# Patient Record
Sex: Female | Born: 1970
Health system: Southern US, Community
[De-identification: ages and names within clinical notes are randomized; demographics above are authoritative.]

## PROBLEM LIST (undated history)

## (undated) DIAGNOSIS — Z6741 Type O blood, Rh negative: Secondary | ICD-10-CM

## (undated) DIAGNOSIS — Z9889 Other specified postprocedural states: Secondary | ICD-10-CM

## (undated) DIAGNOSIS — C50919 Malignant neoplasm of unspecified site of unspecified female breast: Secondary | ICD-10-CM

## (undated) DIAGNOSIS — F419 Anxiety disorder, unspecified: Secondary | ICD-10-CM

## (undated) DIAGNOSIS — Z5189 Encounter for other specified aftercare: Secondary | ICD-10-CM

## (undated) DIAGNOSIS — Z9221 Personal history of antineoplastic chemotherapy: Secondary | ICD-10-CM

## (undated) DIAGNOSIS — Z923 Personal history of irradiation: Secondary | ICD-10-CM

## (undated) DIAGNOSIS — K589 Irritable bowel syndrome without diarrhea: Secondary | ICD-10-CM

## (undated) DIAGNOSIS — A6000 Herpesviral infection of urogenital system, unspecified: Secondary | ICD-10-CM

## (undated) DIAGNOSIS — D649 Anemia, unspecified: Secondary | ICD-10-CM

## (undated) DIAGNOSIS — M1990 Unspecified osteoarthritis, unspecified site: Secondary | ICD-10-CM

## (undated) DIAGNOSIS — O00109 Unspecified tubal pregnancy without intrauterine pregnancy: Secondary | ICD-10-CM

## (undated) DIAGNOSIS — T7840XA Allergy, unspecified, initial encounter: Secondary | ICD-10-CM

## (undated) DIAGNOSIS — R896 Abnormal cytological findings in specimens from other organs, systems and tissues: Secondary | ICD-10-CM

## (undated) HISTORY — DX: Personal history of irradiation: Z92.3

## (undated) HISTORY — DX: Allergy, unspecified, initial encounter: T78.40XA

## (undated) HISTORY — DX: Irritable bowel syndrome, unspecified: K58.9

## (undated) HISTORY — DX: Personal history of antineoplastic chemotherapy: Z92.21

## (undated) HISTORY — DX: Malignant neoplasm of unspecified site of unspecified female breast: C50.919

## (undated) HISTORY — DX: Herpesviral infection of urogenital system, unspecified: A60.00

## (undated) HISTORY — DX: Anxiety disorder, unspecified: F41.9

## (undated) HISTORY — DX: Anemia, unspecified: D64.9

## (undated) HISTORY — PX: EYE SURGERY: SHX253

## (undated) HISTORY — DX: Unspecified tubal pregnancy without intrauterine pregnancy: O00.109

## (undated) HISTORY — DX: Type O blood, Rh negative: Z67.41

## (undated) HISTORY — DX: Unspecified osteoarthritis, unspecified site: M19.90

## (undated) HISTORY — PX: WISDOM TOOTH EXTRACTION: SHX21

## (undated) HISTORY — DX: Encounter for other specified aftercare: Z51.89

## (undated) HISTORY — PX: LAPAROSCOPY: SHX197

## (undated) HISTORY — DX: Abnormal cytological findings in specimens from other organs, systems and tissues: R89.6

## (undated) HISTORY — PX: NASAL SEPTUM SURGERY: SHX37

## (undated) HISTORY — DX: Other specified postprocedural states: Z98.890

## (undated) HISTORY — PX: CHOLECYSTECTOMY: SHX55

---

## 1996-12-01 DIAGNOSIS — O00109 Unspecified tubal pregnancy without intrauterine pregnancy: Secondary | ICD-10-CM

## 1996-12-01 HISTORY — DX: Unspecified tubal pregnancy without intrauterine pregnancy: O00.109

## 2000-05-01 ENCOUNTER — Other Ambulatory Visit: Admission: RE | Admit: 2000-05-01 | Discharge: 2000-05-01 | Payer: Self-pay | Admitting: Obstetrics and Gynecology

## 2000-05-18 ENCOUNTER — Encounter (INDEPENDENT_AMBULATORY_CARE_PROVIDER_SITE_OTHER): Payer: Self-pay | Admitting: *Deleted

## 2000-05-18 ENCOUNTER — Encounter: Admission: RE | Admit: 2000-05-18 | Discharge: 2000-05-18 | Payer: Self-pay | Admitting: Surgery

## 2000-05-18 ENCOUNTER — Other Ambulatory Visit: Admission: RE | Admit: 2000-05-18 | Discharge: 2000-05-18 | Payer: Self-pay | Admitting: Surgery

## 2000-05-18 ENCOUNTER — Encounter: Payer: Self-pay | Admitting: Surgery

## 2000-05-19 ENCOUNTER — Encounter: Payer: Self-pay | Admitting: Surgery

## 2000-06-05 ENCOUNTER — Encounter (INDEPENDENT_AMBULATORY_CARE_PROVIDER_SITE_OTHER): Payer: Self-pay | Admitting: *Deleted

## 2000-06-05 ENCOUNTER — Ambulatory Visit (HOSPITAL_COMMUNITY): Admission: RE | Admit: 2000-06-05 | Discharge: 2000-06-06 | Payer: Self-pay | Admitting: Surgery

## 2000-06-05 ENCOUNTER — Encounter: Payer: Self-pay | Admitting: Surgery

## 2000-06-05 HISTORY — PX: BREAST LUMPECTOMY: SHX2

## 2000-06-15 ENCOUNTER — Encounter: Payer: Self-pay | Admitting: Surgery

## 2000-06-15 ENCOUNTER — Ambulatory Visit (HOSPITAL_COMMUNITY): Admission: RE | Admit: 2000-06-15 | Discharge: 2000-06-15 | Payer: Self-pay | Admitting: Surgery

## 2000-06-15 ENCOUNTER — Encounter (INDEPENDENT_AMBULATORY_CARE_PROVIDER_SITE_OTHER): Payer: Self-pay | Admitting: *Deleted

## 2000-08-02 ENCOUNTER — Emergency Department (HOSPITAL_COMMUNITY): Admission: EM | Admit: 2000-08-02 | Discharge: 2000-08-02 | Payer: Self-pay | Admitting: Emergency Medicine

## 2000-11-30 ENCOUNTER — Encounter: Admission: RE | Admit: 2000-11-30 | Discharge: 2001-02-28 | Payer: Self-pay | Admitting: Radiation Oncology

## 2001-01-20 ENCOUNTER — Ambulatory Visit (HOSPITAL_BASED_OUTPATIENT_CLINIC_OR_DEPARTMENT_OTHER): Admission: RE | Admit: 2001-01-20 | Discharge: 2001-01-20 | Payer: Self-pay | Admitting: Surgery

## 2001-05-07 ENCOUNTER — Encounter: Payer: Self-pay | Admitting: Oncology

## 2001-05-07 ENCOUNTER — Encounter: Admission: RE | Admit: 2001-05-07 | Discharge: 2001-05-07 | Payer: Self-pay | Admitting: Oncology

## 2001-05-17 ENCOUNTER — Other Ambulatory Visit: Admission: RE | Admit: 2001-05-17 | Discharge: 2001-05-17 | Payer: Self-pay | Admitting: Obstetrics and Gynecology

## 2002-01-24 ENCOUNTER — Encounter: Payer: Self-pay | Admitting: Obstetrics and Gynecology

## 2002-01-24 ENCOUNTER — Ambulatory Visit (HOSPITAL_COMMUNITY): Admission: RE | Admit: 2002-01-24 | Discharge: 2002-01-24 | Payer: Self-pay | Admitting: Obstetrics and Gynecology

## 2002-02-04 ENCOUNTER — Encounter (INDEPENDENT_AMBULATORY_CARE_PROVIDER_SITE_OTHER): Payer: Self-pay | Admitting: Specialist

## 2002-02-04 ENCOUNTER — Ambulatory Visit (HOSPITAL_COMMUNITY): Admission: RE | Admit: 2002-02-04 | Discharge: 2002-02-04 | Payer: Self-pay | Admitting: Obstetrics and Gynecology

## 2002-05-10 ENCOUNTER — Encounter: Admission: RE | Admit: 2002-05-10 | Discharge: 2002-05-10 | Payer: Self-pay | Admitting: Oncology

## 2002-05-10 ENCOUNTER — Encounter: Payer: Self-pay | Admitting: Oncology

## 2003-05-12 ENCOUNTER — Encounter: Admission: RE | Admit: 2003-05-12 | Discharge: 2003-05-12 | Payer: Self-pay | Admitting: Oncology

## 2003-05-12 ENCOUNTER — Encounter: Payer: Self-pay | Admitting: Oncology

## 2004-05-31 ENCOUNTER — Encounter: Admission: RE | Admit: 2004-05-31 | Discharge: 2004-05-31 | Payer: Self-pay | Admitting: Oncology

## 2005-01-10 ENCOUNTER — Ambulatory Visit: Payer: Self-pay | Admitting: Oncology

## 2005-07-14 ENCOUNTER — Ambulatory Visit: Payer: Self-pay | Admitting: Oncology

## 2005-07-17 ENCOUNTER — Encounter: Admission: RE | Admit: 2005-07-17 | Discharge: 2005-07-17 | Payer: Self-pay | Admitting: Oncology

## 2005-07-28 ENCOUNTER — Encounter: Admission: RE | Admit: 2005-07-28 | Discharge: 2005-07-28 | Payer: Self-pay | Admitting: Oncology

## 2006-01-22 ENCOUNTER — Ambulatory Visit: Payer: Self-pay | Admitting: Oncology

## 2006-08-07 ENCOUNTER — Encounter: Admission: RE | Admit: 2006-08-07 | Discharge: 2006-08-07 | Payer: Self-pay | Admitting: Oncology

## 2007-02-04 ENCOUNTER — Ambulatory Visit: Payer: Self-pay | Admitting: Oncology

## 2007-05-04 ENCOUNTER — Ambulatory Visit: Payer: Self-pay | Admitting: Oncology

## 2007-08-25 ENCOUNTER — Encounter: Admission: RE | Admit: 2007-08-25 | Discharge: 2007-08-25 | Payer: Self-pay | Admitting: Oncology

## 2007-10-11 ENCOUNTER — Encounter: Admission: RE | Admit: 2007-10-11 | Discharge: 2007-10-11 | Payer: Self-pay | Admitting: Oncology

## 2007-12-02 DIAGNOSIS — IMO0001 Reserved for inherently not codable concepts without codable children: Secondary | ICD-10-CM

## 2007-12-02 HISTORY — DX: Reserved for inherently not codable concepts without codable children: IMO0001

## 2008-02-08 ENCOUNTER — Ambulatory Visit: Payer: Self-pay | Admitting: Oncology

## 2008-03-29 ENCOUNTER — Ambulatory Visit: Payer: Self-pay | Admitting: Oncology

## 2008-05-23 ENCOUNTER — Ambulatory Visit: Payer: Self-pay | Admitting: Oncology

## 2008-08-25 ENCOUNTER — Encounter: Admission: RE | Admit: 2008-08-25 | Discharge: 2008-08-25 | Payer: Self-pay | Admitting: Oncology

## 2008-12-08 ENCOUNTER — Ambulatory Visit: Payer: Self-pay | Admitting: Family Medicine

## 2008-12-21 ENCOUNTER — Encounter: Payer: Self-pay | Admitting: Obstetrics and Gynecology

## 2008-12-21 ENCOUNTER — Ambulatory Visit: Payer: Self-pay | Admitting: Family Medicine

## 2008-12-21 ENCOUNTER — Ambulatory Visit: Payer: Self-pay | Admitting: Obstetrics & Gynecology

## 2008-12-21 LAB — CONVERTED CEMR LAB
Antibody Screen: NEGATIVE
Basophils Absolute: 0.1 10*3/uL (ref 0.0–0.1)
Basophils Relative: 1 % (ref 0–1)
Eosinophils Absolute: 0.1 10*3/uL (ref 0.0–0.7)
MCHC: 31 g/dL (ref 30.0–36.0)
MCV: 88.2 fL (ref 78.0–100.0)
Monocytes Absolute: 0.4 10*3/uL (ref 0.1–1.0)
Monocytes Relative: 5 % (ref 3–12)
Neutro Abs: 6.3 10*3/uL (ref 1.7–7.7)
Neutrophils Relative %: 70 % (ref 43–77)
RBC: 4.42 M/uL (ref 3.87–5.11)
RDW: 13.5 % (ref 11.5–15.5)
Rubella: 8 intl units/mL — ABNORMAL HIGH

## 2008-12-22 ENCOUNTER — Encounter: Payer: Self-pay | Admitting: Obstetrics and Gynecology

## 2008-12-29 ENCOUNTER — Ambulatory Visit: Payer: Self-pay | Admitting: Physician Assistant

## 2008-12-29 LAB — CONVERTED CEMR LAB: GC Probe Amp, Urine: NEGATIVE

## 2009-01-26 ENCOUNTER — Ambulatory Visit (HOSPITAL_COMMUNITY): Admission: RE | Admit: 2009-01-26 | Discharge: 2009-01-26 | Payer: Self-pay | Admitting: Obstetrics & Gynecology

## 2009-02-02 ENCOUNTER — Ambulatory Visit: Payer: Self-pay | Admitting: Family

## 2009-02-13 ENCOUNTER — Ambulatory Visit (HOSPITAL_COMMUNITY): Admission: RE | Admit: 2009-02-13 | Discharge: 2009-02-13 | Payer: Self-pay | Admitting: Obstetrics & Gynecology

## 2009-02-27 ENCOUNTER — Ambulatory Visit (HOSPITAL_COMMUNITY): Admission: RE | Admit: 2009-02-27 | Discharge: 2009-02-27 | Payer: Self-pay | Admitting: Obstetrics & Gynecology

## 2009-03-02 ENCOUNTER — Ambulatory Visit: Payer: Self-pay | Admitting: Oncology

## 2009-03-09 ENCOUNTER — Ambulatory Visit: Payer: Self-pay | Admitting: Family

## 2009-03-30 ENCOUNTER — Ambulatory Visit (HOSPITAL_COMMUNITY): Admission: RE | Admit: 2009-03-30 | Discharge: 2009-03-30 | Payer: Self-pay | Admitting: Obstetrics & Gynecology

## 2009-04-06 ENCOUNTER — Ambulatory Visit: Payer: Self-pay | Admitting: Obstetrics and Gynecology

## 2009-04-25 ENCOUNTER — Ambulatory Visit: Payer: Self-pay | Admitting: Obstetrics & Gynecology

## 2009-05-04 ENCOUNTER — Ambulatory Visit: Payer: Self-pay | Admitting: Family

## 2009-05-04 LAB — CONVERTED CEMR LAB
Antibody Screen: NEGATIVE
Platelets: 299 10*3/uL (ref 150–400)
RBC: 3.54 M/uL — ABNORMAL LOW (ref 3.87–5.11)
WBC: 12.9 10*3/uL — ABNORMAL HIGH (ref 4.0–10.5)

## 2009-05-08 ENCOUNTER — Ambulatory Visit: Payer: Self-pay | Admitting: Obstetrics & Gynecology

## 2009-05-11 ENCOUNTER — Ambulatory Visit (HOSPITAL_COMMUNITY): Admission: RE | Admit: 2009-05-11 | Discharge: 2009-05-11 | Payer: Self-pay | Admitting: Obstetrics & Gynecology

## 2009-05-18 ENCOUNTER — Ambulatory Visit: Payer: Self-pay | Admitting: Family

## 2009-05-23 ENCOUNTER — Ambulatory Visit: Payer: Self-pay | Admitting: Oncology

## 2009-05-29 ENCOUNTER — Encounter: Admission: RE | Admit: 2009-05-29 | Discharge: 2009-05-29 | Payer: Self-pay | Admitting: Oncology

## 2009-05-29 ENCOUNTER — Encounter (INDEPENDENT_AMBULATORY_CARE_PROVIDER_SITE_OTHER): Payer: Self-pay | Admitting: Diagnostic Radiology

## 2009-06-01 ENCOUNTER — Ambulatory Visit: Payer: Self-pay | Admitting: Obstetrics and Gynecology

## 2009-06-06 ENCOUNTER — Ambulatory Visit: Payer: Self-pay | Admitting: Genetic Counselor

## 2009-06-06 ENCOUNTER — Ambulatory Visit: Payer: Self-pay | Admitting: Obstetrics & Gynecology

## 2009-06-07 ENCOUNTER — Ambulatory Visit: Payer: Self-pay | Admitting: Oncology

## 2009-06-08 ENCOUNTER — Ambulatory Visit (HOSPITAL_COMMUNITY): Admission: RE | Admit: 2009-06-08 | Discharge: 2009-06-08 | Payer: Self-pay | Admitting: Obstetrics & Gynecology

## 2009-06-11 ENCOUNTER — Ambulatory Visit: Payer: Self-pay | Admitting: Obstetrics & Gynecology

## 2009-06-12 ENCOUNTER — Ambulatory Visit: Payer: Self-pay | Admitting: Obstetrics & Gynecology

## 2009-06-15 ENCOUNTER — Inpatient Hospital Stay (HOSPITAL_COMMUNITY): Admission: AD | Admit: 2009-06-15 | Discharge: 2009-06-15 | Payer: Self-pay | Admitting: Obstetrics & Gynecology

## 2009-06-15 ENCOUNTER — Ambulatory Visit: Payer: Self-pay | Admitting: Advanced Practice Midwife

## 2009-06-18 ENCOUNTER — Inpatient Hospital Stay (HOSPITAL_COMMUNITY): Admission: RE | Admit: 2009-06-18 | Discharge: 2009-06-19 | Payer: Self-pay | Admitting: Surgery

## 2009-06-18 ENCOUNTER — Encounter (INDEPENDENT_AMBULATORY_CARE_PROVIDER_SITE_OTHER): Payer: Self-pay | Admitting: Surgery

## 2009-06-18 HISTORY — PX: MASTECTOMY: SHX3

## 2009-06-27 ENCOUNTER — Ambulatory Visit: Payer: Self-pay | Admitting: Obstetrics & Gynecology

## 2009-06-29 ENCOUNTER — Ambulatory Visit (HOSPITAL_COMMUNITY): Admission: RE | Admit: 2009-06-29 | Discharge: 2009-06-29 | Payer: Self-pay | Admitting: Obstetrics & Gynecology

## 2009-07-04 ENCOUNTER — Ambulatory Visit: Payer: Self-pay | Admitting: Obstetrics & Gynecology

## 2009-07-05 ENCOUNTER — Encounter: Payer: Self-pay | Admitting: Obstetrics & Gynecology

## 2009-07-05 LAB — CONVERTED CEMR LAB
Chlamydia, DNA Probe: NEGATIVE
GC Probe Amp, Genital: NEGATIVE

## 2009-07-06 ENCOUNTER — Ambulatory Visit: Payer: Self-pay | Admitting: Genetic Counselor

## 2009-07-11 ENCOUNTER — Ambulatory Visit: Payer: Self-pay | Admitting: Obstetrics & Gynecology

## 2009-07-16 ENCOUNTER — Inpatient Hospital Stay (HOSPITAL_COMMUNITY): Admission: AD | Admit: 2009-07-16 | Discharge: 2009-07-20 | Payer: Self-pay | Admitting: Obstetrics & Gynecology

## 2009-07-16 ENCOUNTER — Ambulatory Visit: Payer: Self-pay | Admitting: Physician Assistant

## 2009-07-22 ENCOUNTER — Encounter: Admission: RE | Admit: 2009-07-22 | Discharge: 2009-08-21 | Payer: Self-pay | Admitting: Obstetrics & Gynecology

## 2009-07-24 ENCOUNTER — Ambulatory Visit: Payer: Self-pay | Admitting: Oncology

## 2009-07-24 LAB — CBC WITH DIFFERENTIAL/PLATELET
Eosinophils Absolute: 0.5 10*3/uL (ref 0.0–0.5)
MONO#: 0.6 10*3/uL (ref 0.1–0.9)
MONO%: 5.5 % (ref 0.0–14.0)
NEUT#: 7.1 10*3/uL — ABNORMAL HIGH (ref 1.5–6.5)
RBC: 3.49 10*6/uL — ABNORMAL LOW (ref 3.70–5.45)
RDW: 14.5 % (ref 11.2–14.5)
WBC: 10.5 10*3/uL — ABNORMAL HIGH (ref 3.9–10.3)

## 2009-07-24 LAB — COMPREHENSIVE METABOLIC PANEL
AST: 15 U/L (ref 0–37)
Albumin: 3.4 g/dL — ABNORMAL LOW (ref 3.5–5.2)
Alkaline Phosphatase: 136 U/L — ABNORMAL HIGH (ref 39–117)
BUN: 13 mg/dL (ref 6–23)
Glucose, Bld: 80 mg/dL (ref 70–99)
Potassium: 4.1 mEq/L (ref 3.5–5.3)
Sodium: 138 mEq/L (ref 135–145)
Total Bilirubin: 0.3 mg/dL (ref 0.3–1.2)

## 2009-08-15 ENCOUNTER — Ambulatory Visit (HOSPITAL_COMMUNITY): Admission: RE | Admit: 2009-08-15 | Discharge: 2009-08-15 | Payer: Self-pay | Admitting: Surgery

## 2009-08-16 LAB — CBC WITH DIFFERENTIAL/PLATELET
Basophils Absolute: 0 10*3/uL (ref 0.0–0.1)
EOS%: 0.1 % (ref 0.0–7.0)
HCT: 36.4 % (ref 34.8–46.6)
HGB: 11.8 g/dL (ref 11.6–15.9)
LYMPH%: 12 % — ABNORMAL LOW (ref 14.0–49.7)
MCH: 27.8 pg (ref 25.1–34.0)
MCV: 85.8 fL (ref 79.5–101.0)
NEUT%: 87 % — ABNORMAL HIGH (ref 38.4–76.8)
Platelets: 236 10*3/uL (ref 145–400)
lymph#: 1.2 10*3/uL (ref 0.9–3.3)

## 2009-08-21 ENCOUNTER — Ambulatory Visit: Payer: Self-pay | Admitting: Oncology

## 2009-08-22 ENCOUNTER — Encounter: Admission: RE | Admit: 2009-08-22 | Discharge: 2009-09-01 | Payer: Self-pay | Admitting: Obstetrics & Gynecology

## 2009-08-23 LAB — CBC WITH DIFFERENTIAL/PLATELET
Basophils Absolute: 0 10*3/uL (ref 0.0–0.1)
EOS%: 3.8 % (ref 0.0–7.0)
HCT: 33.4 % — ABNORMAL LOW (ref 34.8–46.6)
HGB: 11.5 g/dL — ABNORMAL LOW (ref 11.6–15.9)
MCH: 29.1 pg (ref 25.1–34.0)
MCHC: 34.5 g/dL (ref 31.5–36.0)
MCV: 84.3 fL (ref 79.5–101.0)
MONO%: 4.3 % (ref 0.0–14.0)
NEUT%: 53 % (ref 38.4–76.8)
RDW: 14 % (ref 11.2–14.5)

## 2009-08-30 LAB — CBC WITH DIFFERENTIAL/PLATELET
BASO%: 0.4 % (ref 0.0–2.0)
EOS%: 1.7 % (ref 0.0–7.0)
MCH: 27.5 pg (ref 25.1–34.0)
MCV: 84.4 fL (ref 79.5–101.0)
MONO%: 10.6 % (ref 0.0–14.0)
RBC: 3.71 10*6/uL (ref 3.70–5.45)
RDW: 13.8 % (ref 11.2–14.5)

## 2009-09-05 ENCOUNTER — Ambulatory Visit: Payer: Self-pay | Admitting: Obstetrics & Gynecology

## 2009-09-05 ENCOUNTER — Encounter: Payer: Self-pay | Admitting: Obstetrics & Gynecology

## 2009-09-05 ENCOUNTER — Other Ambulatory Visit: Admission: RE | Admit: 2009-09-05 | Discharge: 2009-09-05 | Payer: Self-pay | Admitting: Obstetrics & Gynecology

## 2009-09-06 LAB — CBC WITH DIFFERENTIAL/PLATELET
BASO%: 0.8 % (ref 0.0–2.0)
EOS%: 1 % (ref 0.0–7.0)
HCT: 30.6 % — ABNORMAL LOW (ref 34.8–46.6)
LYMPH%: 34.2 % (ref 14.0–49.7)
MCH: 28.4 pg (ref 25.1–34.0)
MCHC: 33 g/dL (ref 31.5–36.0)
MONO%: 8.2 % (ref 0.0–14.0)
NEUT%: 55.8 % (ref 38.4–76.8)
Platelets: 215 10*3/uL (ref 145–400)

## 2009-09-06 LAB — COMPREHENSIVE METABOLIC PANEL
ALT: 25 U/L (ref 0–35)
AST: 24 U/L (ref 0–37)
Chloride: 100 mEq/L (ref 96–112)
Creatinine, Ser: 0.85 mg/dL (ref 0.40–1.20)
Sodium: 134 mEq/L — ABNORMAL LOW (ref 135–145)
Total Bilirubin: 0.7 mg/dL (ref 0.3–1.2)
Total Protein: 6.9 g/dL (ref 6.0–8.3)

## 2009-09-13 LAB — CBC WITH DIFFERENTIAL/PLATELET
BASO%: 0.9 % (ref 0.0–2.0)
Basophils Absolute: 0 10*3/uL (ref 0.0–0.1)
EOS%: 1.5 % (ref 0.0–7.0)
MCH: 27.9 pg (ref 25.1–34.0)
MCHC: 32.5 g/dL (ref 31.5–36.0)
MCV: 85.8 fL (ref 79.5–101.0)
MONO%: 5.8 % (ref 0.0–14.0)
RBC: 3.44 10*6/uL — ABNORMAL LOW (ref 3.70–5.45)
RDW: 15.2 % — ABNORMAL HIGH (ref 11.2–14.5)

## 2009-09-18 ENCOUNTER — Ambulatory Visit: Payer: Self-pay | Admitting: Oncology

## 2009-09-20 LAB — CBC WITH DIFFERENTIAL/PLATELET
EOS%: 0.9 % (ref 0.0–7.0)
Eosinophils Absolute: 0 10*3/uL (ref 0.0–0.5)
MCV: 85.1 fL (ref 79.5–101.0)
MONO%: 9.4 % (ref 0.0–14.0)
NEUT#: 1.7 10*3/uL (ref 1.5–6.5)
RBC: 3.22 10*6/uL — ABNORMAL LOW (ref 3.70–5.45)
RDW: 15.1 % — ABNORMAL HIGH (ref 11.2–14.5)
WBC: 4.3 10*3/uL (ref 3.9–10.3)
nRBC: 0 % (ref 0–0)

## 2009-09-27 LAB — CBC WITH DIFFERENTIAL/PLATELET
BASO%: 0.5 % (ref 0.0–2.0)
HCT: 27.5 % — ABNORMAL LOW (ref 34.8–46.6)
MCHC: 34.2 g/dL (ref 31.5–36.0)
MONO#: 0.4 10*3/uL (ref 0.1–0.9)
RBC: 3.09 10*6/uL — ABNORMAL LOW (ref 3.70–5.45)
WBC: 5.2 10*3/uL (ref 3.9–10.3)
lymph#: 1.8 10*3/uL (ref 0.9–3.3)

## 2009-09-27 LAB — COMPREHENSIVE METABOLIC PANEL
ALT: 22 U/L (ref 0–35)
AST: 22 U/L (ref 0–37)
Alkaline Phosphatase: 80 U/L (ref 39–117)
BUN: 8 mg/dL (ref 6–23)
Calcium: 9 mg/dL (ref 8.4–10.5)
Creatinine, Ser: 0.87 mg/dL (ref 0.40–1.20)
Total Bilirubin: 0.3 mg/dL (ref 0.3–1.2)

## 2009-10-04 LAB — CBC WITH DIFFERENTIAL/PLATELET
Eosinophils Absolute: 0.1 10*3/uL (ref 0.0–0.5)
HCT: 26.9 % — ABNORMAL LOW (ref 34.8–46.6)
LYMPH%: 39.1 % (ref 14.0–49.7)
MONO#: 0.3 10*3/uL (ref 0.1–0.9)
NEUT#: 2.2 10*3/uL (ref 1.5–6.5)
NEUT%: 50.2 % (ref 38.4–76.8)
Platelets: 288 10*3/uL (ref 145–400)
WBC: 4.3 10*3/uL (ref 3.9–10.3)
lymph#: 1.7 10*3/uL (ref 0.9–3.3)

## 2009-10-11 ENCOUNTER — Encounter (HOSPITAL_COMMUNITY): Admission: RE | Admit: 2009-10-11 | Discharge: 2009-11-29 | Payer: Self-pay | Admitting: Oncology

## 2009-10-11 ENCOUNTER — Ambulatory Visit: Payer: Self-pay | Admitting: Oncology

## 2009-10-11 LAB — CBC WITH DIFFERENTIAL/PLATELET
BASO%: 0.5 % (ref 0.0–2.0)
Eosinophils Absolute: 0 10*3/uL (ref 0.0–0.5)
MCHC: 32.8 g/dL (ref 31.5–36.0)
MONO#: 0.4 10*3/uL (ref 0.1–0.9)
NEUT#: 1.4 10*3/uL — ABNORMAL LOW (ref 1.5–6.5)
Platelets: 98 10*3/uL — ABNORMAL LOW (ref 145–400)
RBC: 2.81 10*6/uL — ABNORMAL LOW (ref 3.70–5.45)
RDW: 19.6 % — ABNORMAL HIGH (ref 11.2–14.5)
WBC: 4.3 10*3/uL (ref 3.9–10.3)
lymph#: 2.4 10*3/uL (ref 0.9–3.3)

## 2009-10-11 LAB — HOLD TUBE, BLOOD BANK

## 2009-10-12 LAB — TYPE & CROSSMATCH - CHCC

## 2009-10-18 LAB — COMPREHENSIVE METABOLIC PANEL
ALT: 35 U/L (ref 0–35)
AST: 30 U/L (ref 0–37)
Albumin: 3.9 g/dL (ref 3.5–5.2)
Alkaline Phosphatase: 62 U/L (ref 39–117)
BUN: 9 mg/dL (ref 6–23)
Calcium: 9.1 mg/dL (ref 8.4–10.5)
Chloride: 103 mEq/L (ref 96–112)
Potassium: 3.7 mEq/L (ref 3.5–5.3)

## 2009-10-18 LAB — CBC WITH DIFFERENTIAL/PLATELET
BASO%: 0.6 % (ref 0.0–2.0)
Basophils Absolute: 0 10*3/uL (ref 0.0–0.1)
EOS%: 0.6 % (ref 0.0–7.0)
HCT: 33 % — ABNORMAL LOW (ref 34.8–46.6)
HGB: 11.2 g/dL — ABNORMAL LOW (ref 11.6–15.9)
MCH: 30.4 pg (ref 25.1–34.0)
MCHC: 33.9 g/dL (ref 31.5–36.0)
MCV: 89.7 fL (ref 79.5–101.0)
MONO%: 7.4 % (ref 0.0–14.0)
NEUT%: 46.8 % (ref 38.4–76.8)
RDW: 18.7 % — ABNORMAL HIGH (ref 11.2–14.5)
lymph#: 2.3 10*3/uL (ref 0.9–3.3)

## 2009-10-26 LAB — CBC WITH DIFFERENTIAL/PLATELET
BASO%: 0.5 % (ref 0.0–2.0)
EOS%: 2.3 % (ref 0.0–7.0)
HCT: 30.6 % — ABNORMAL LOW (ref 34.8–46.6)
LYMPH%: 39.4 % (ref 14.0–49.7)
MCH: 31 pg (ref 25.1–34.0)
MCHC: 33 g/dL (ref 31.5–36.0)
NEUT%: 48.9 % (ref 38.4–76.8)
RBC: 3.26 10*6/uL — ABNORMAL LOW (ref 3.70–5.45)
WBC: 5.6 10*3/uL (ref 3.9–10.3)
lymph#: 2.2 10*3/uL (ref 0.9–3.3)

## 2009-11-01 ENCOUNTER — Ambulatory Visit: Payer: Self-pay | Admitting: Oncology

## 2009-11-01 LAB — CBC WITH DIFFERENTIAL/PLATELET
BASO%: 1 % (ref 0.0–2.0)
EOS%: 2.3 % (ref 0.0–7.0)
Eosinophils Absolute: 0.1 10*3/uL (ref 0.0–0.5)
MCH: 31 pg (ref 25.1–34.0)
MCV: 92.2 fL (ref 79.5–101.0)
MONO%: 6.4 % (ref 0.0–14.0)
NEUT#: 2 10*3/uL (ref 1.5–6.5)
RBC: 3.32 10*6/uL — ABNORMAL LOW (ref 3.70–5.45)
RDW: 19.2 % — ABNORMAL HIGH (ref 11.2–14.5)

## 2009-11-01 LAB — COMPREHENSIVE METABOLIC PANEL
Albumin: 4.3 g/dL (ref 3.5–5.2)
Alkaline Phosphatase: 63 U/L (ref 39–117)
Glucose, Bld: 89 mg/dL (ref 70–99)
Potassium: 4 mEq/L (ref 3.5–5.3)
Sodium: 137 mEq/L (ref 135–145)
Total Protein: 7.1 g/dL (ref 6.0–8.3)

## 2009-11-08 LAB — CBC WITH DIFFERENTIAL/PLATELET
Eosinophils Absolute: 0.1 10*3/uL (ref 0.0–0.5)
MONO#: 0.6 10*3/uL (ref 0.1–0.9)
NEUT#: 2.7 10*3/uL (ref 1.5–6.5)
RBC: 3.14 10*6/uL — ABNORMAL LOW (ref 3.70–5.45)
RDW: 19.6 % — ABNORMAL HIGH (ref 11.2–14.5)
WBC: 5.3 10*3/uL (ref 3.9–10.3)
lymph#: 2 10*3/uL (ref 0.9–3.3)

## 2009-12-03 ENCOUNTER — Ambulatory Visit: Payer: Self-pay | Admitting: Oncology

## 2009-12-06 LAB — CBC WITH DIFFERENTIAL/PLATELET
BASO%: 1.1 % (ref 0.0–2.0)
EOS%: 3.8 % (ref 0.0–7.0)
MCH: 32.8 pg (ref 25.1–34.0)
MCHC: 33 g/dL (ref 31.5–36.0)
MCV: 99.4 fL (ref 79.5–101.0)
MONO%: 8.2 % (ref 0.0–14.0)
RBC: 3.32 10*6/uL — ABNORMAL LOW (ref 3.70–5.45)
RDW: 15.4 % — ABNORMAL HIGH (ref 11.2–14.5)
lymph#: 2.2 10*3/uL (ref 0.9–3.3)
nRBC: 0 % (ref 0–0)

## 2010-01-02 ENCOUNTER — Ambulatory Visit (HOSPITAL_BASED_OUTPATIENT_CLINIC_OR_DEPARTMENT_OTHER): Admission: RE | Admit: 2010-01-02 | Discharge: 2010-01-02 | Payer: Self-pay | Admitting: Surgery

## 2010-03-05 ENCOUNTER — Ambulatory Visit: Payer: Self-pay | Admitting: Oncology

## 2010-03-07 LAB — CBC WITH DIFFERENTIAL/PLATELET
EOS%: 8.7 % — ABNORMAL HIGH (ref 0.0–7.0)
Eosinophils Absolute: 0.8 10*3/uL — ABNORMAL HIGH (ref 0.0–0.5)
LYMPH%: 27.5 % (ref 14.0–49.7)
MCH: 29.8 pg (ref 25.1–34.0)
MCV: 90.1 fL (ref 79.5–101.0)
MONO%: 5.4 % (ref 0.0–14.0)
NEUT#: 5.3 10*3/uL (ref 1.5–6.5)
Platelets: 231 10*3/uL (ref 145–400)
RBC: 4.13 10*6/uL (ref 3.70–5.45)
nRBC: 0 % (ref 0–0)

## 2010-04-06 IMAGING — US US OB FOLLOW-UP
1 series · 18 of 28 positions shown · non-contrast
Comparison: none

OBSTETRICAL ULTRASOUND:
 This ultrasound was performed in The [HOSPITAL], and the [facility] report will be stored to [REDACTED] PACS.

[Series 1: us ob follow-up · 18 of 39 slices shown]
[im 1/39]
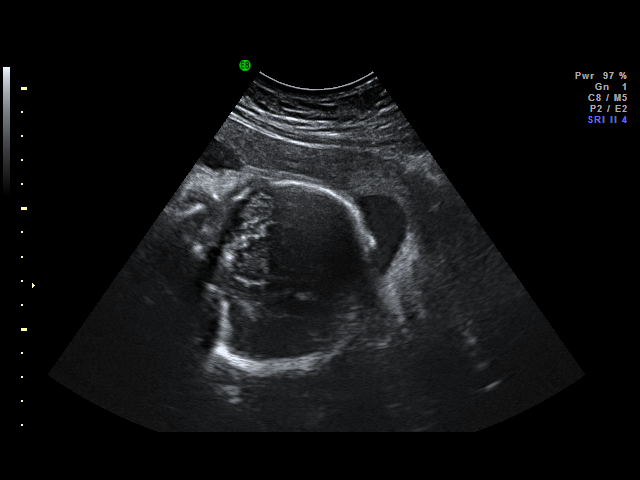
[im 3/39]
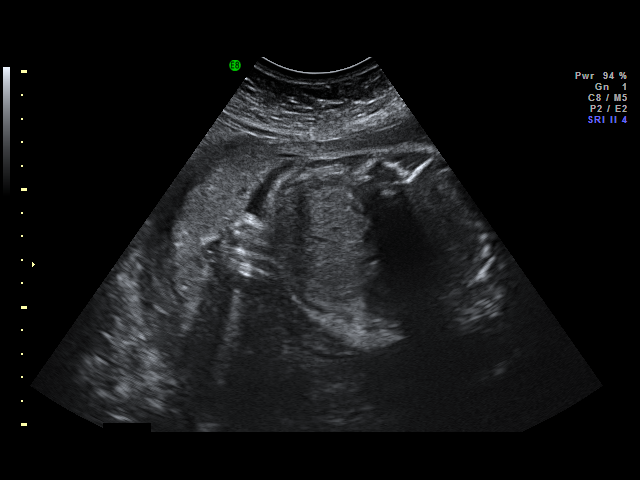
[im 5/39]
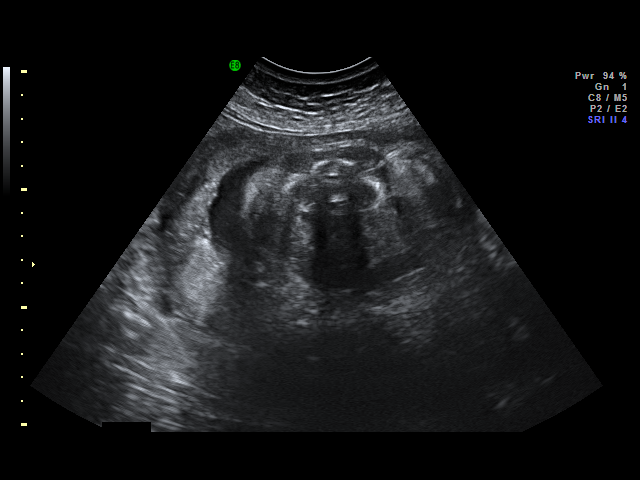
[im 8/39]
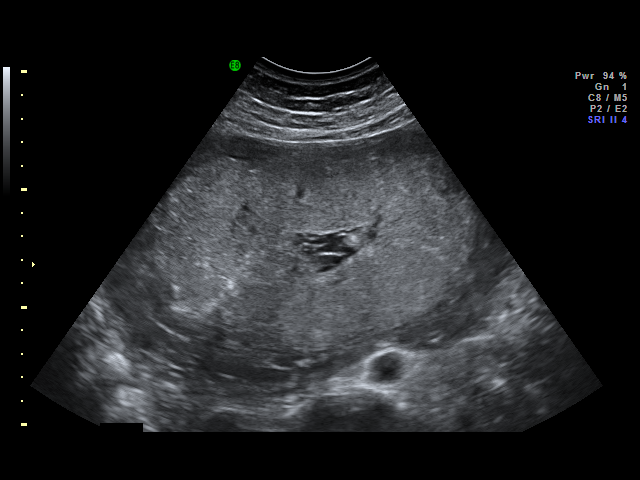
[im 10/39]
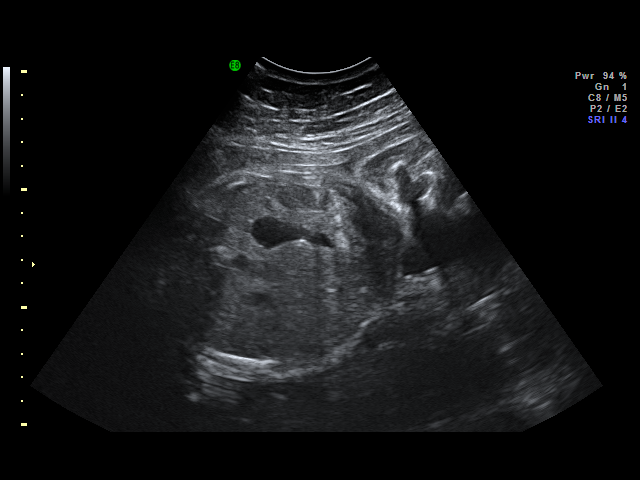
[im 12/39]
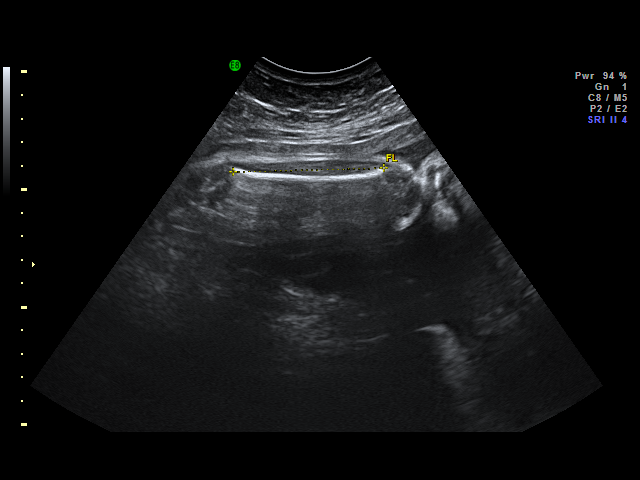
[im 15/39]
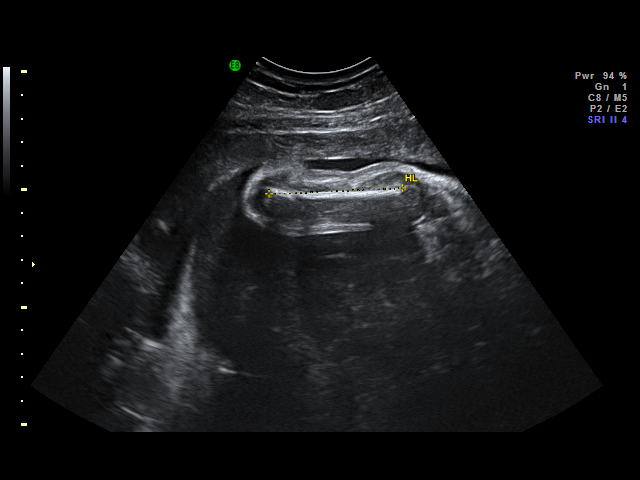
[im 16/39]
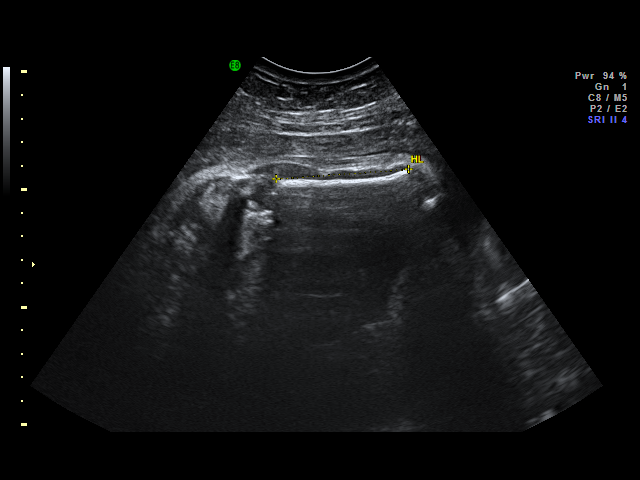
[im 19/39]
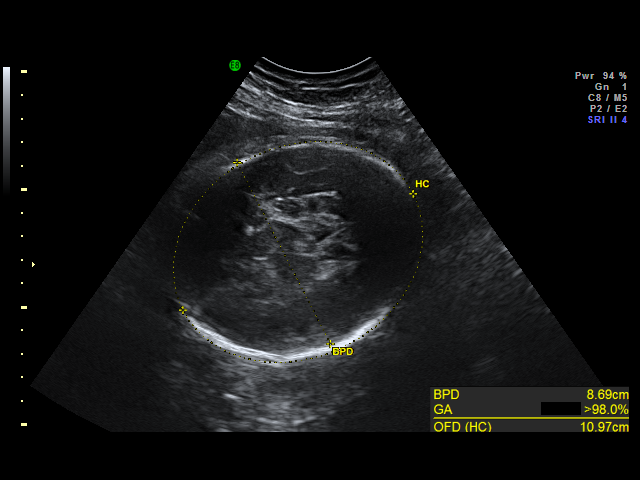
[im 20/39]
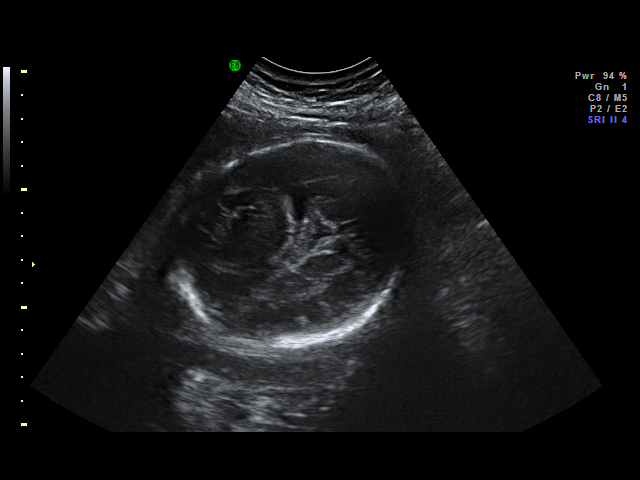
[im 23/39]
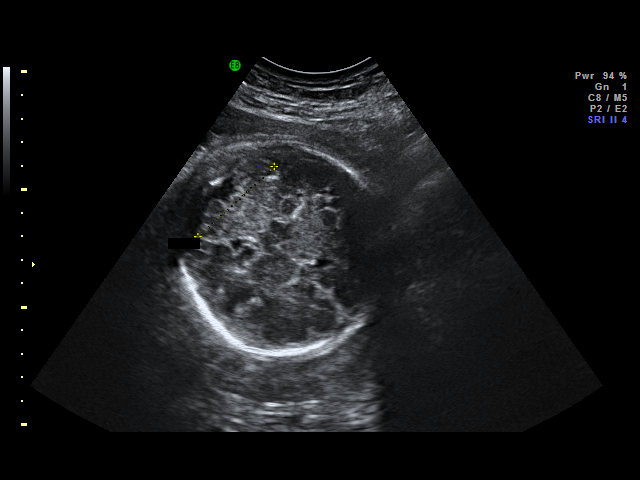
[im 24/39]
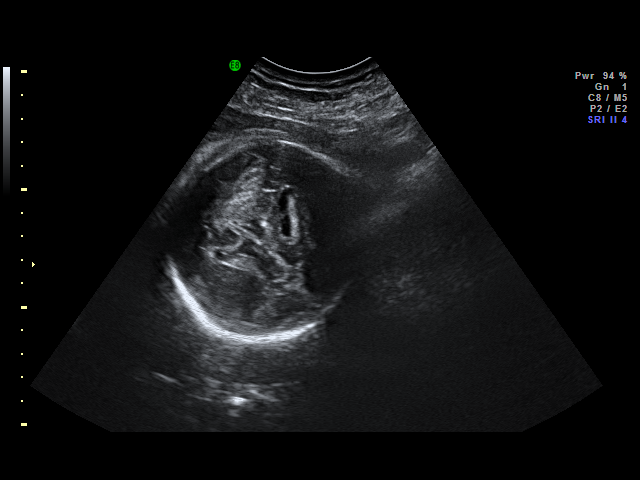
[im 27/39]
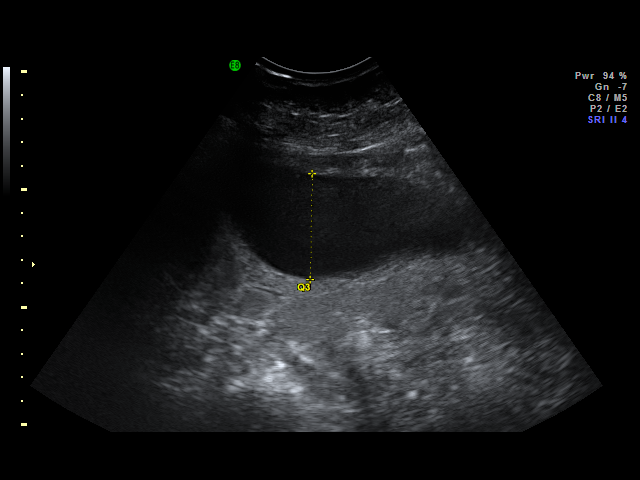
[im 30/39]
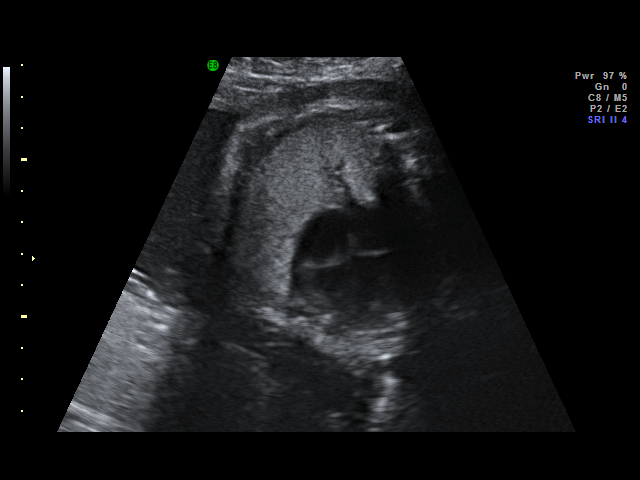
[im 31/39]
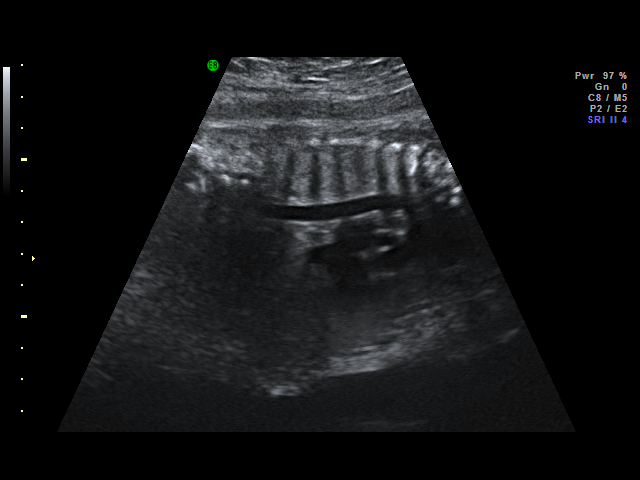
[im 34/39]
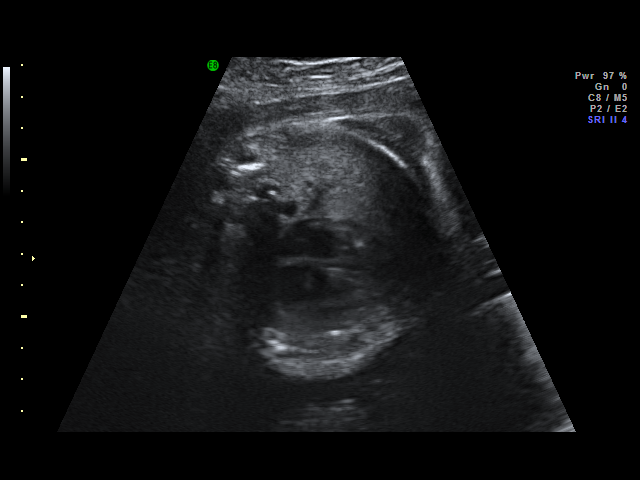
[im 36/39]
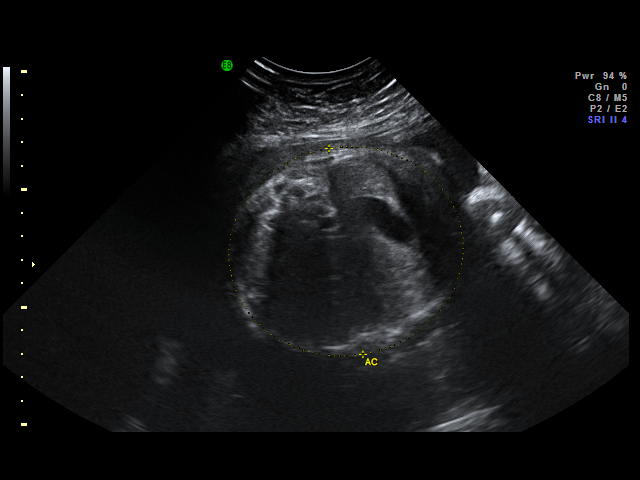
[im 39/39]
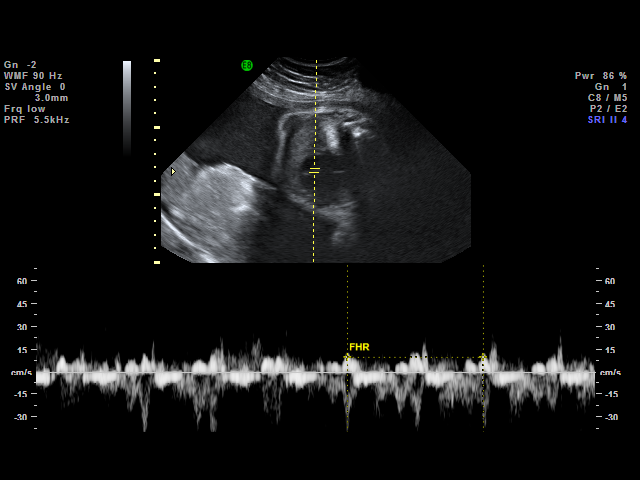

[18 of 28 positions shown; findings below may reference images not displayed]

IMPRESSION: [facility] has also been faxed to the ordering physician.

## 2010-04-27 IMAGING — US US OB FOLLOW-UP
1 series · 14 of 28 positions shown · non-contrast
Comparison: none

OBSTETRICAL ULTRASOUND:
 This ultrasound was performed in The [HOSPITAL], and the [facility] report will be stored to [REDACTED] PACS.

[Series 1: us ob follow-up · 14 of 29 slices shown]
[im 2/29]
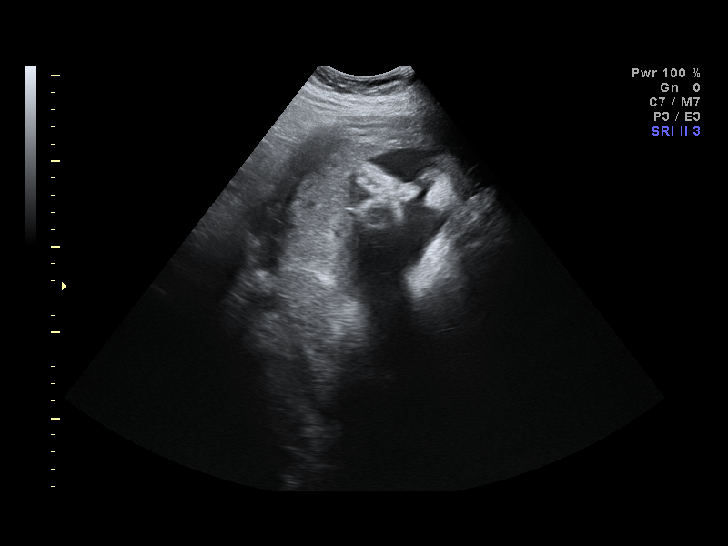
[im 4/29]
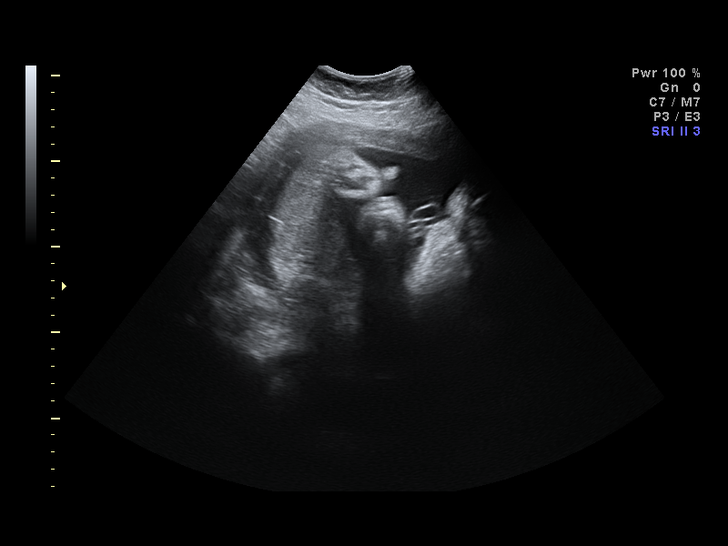
[im 6/29]
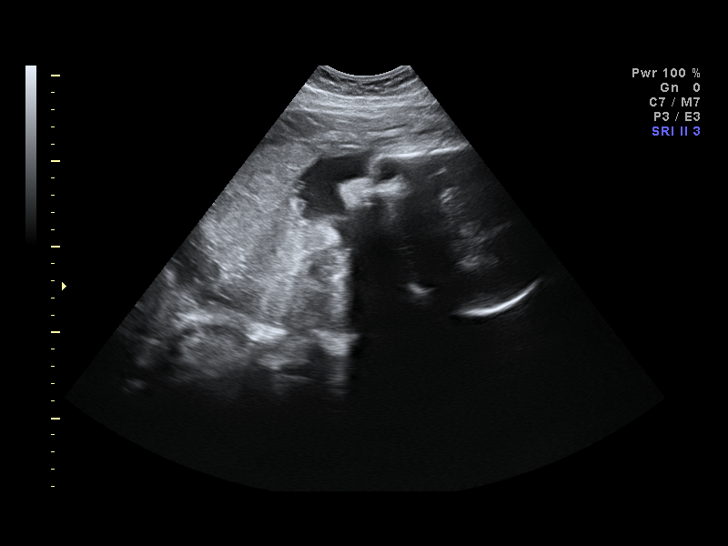
[im 8/29]
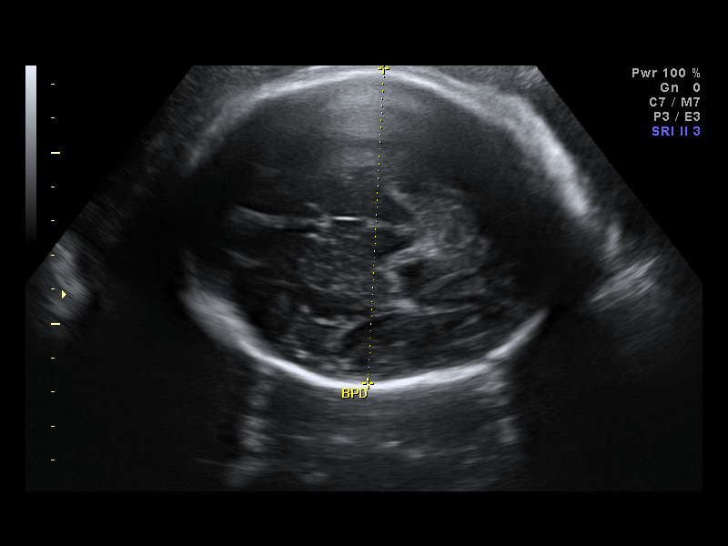
[im 10/29]
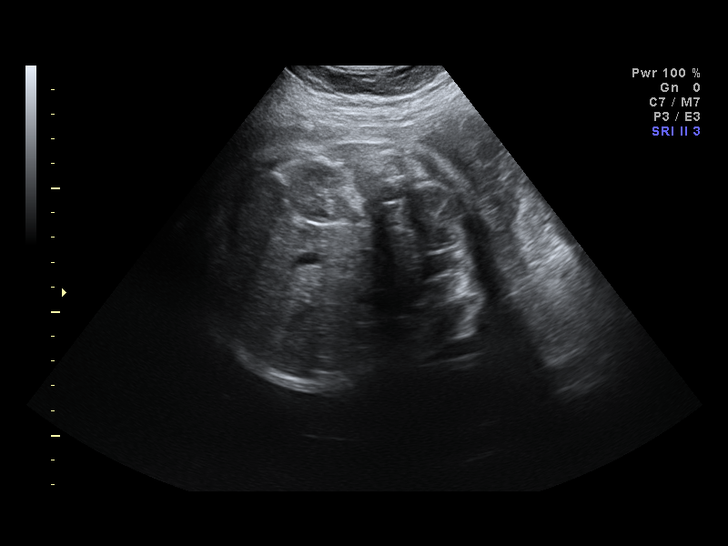
[im 12/29]
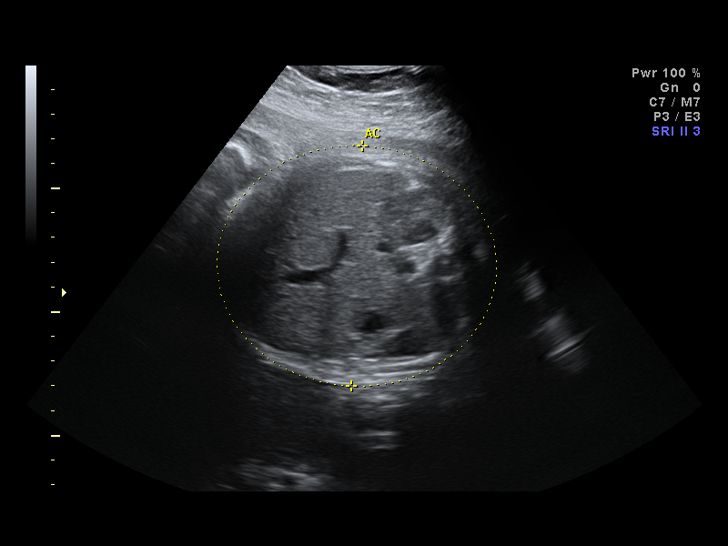
[im 14/29]
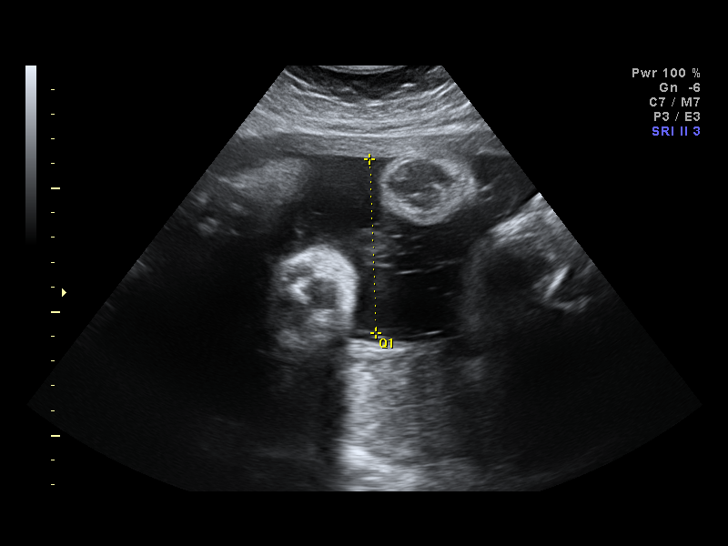
[im 16/29]
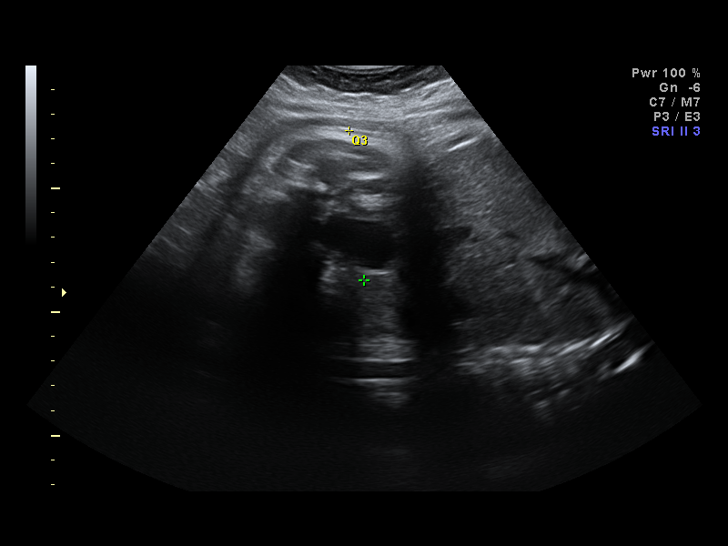
[im 18/29]
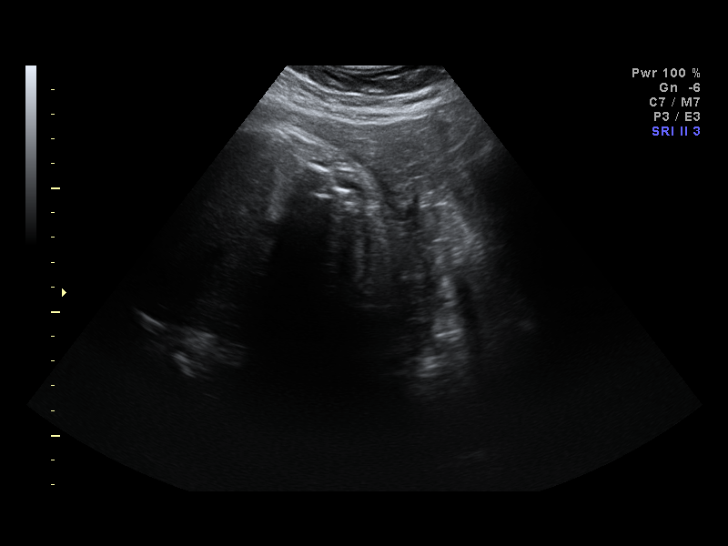
[im 20/29]
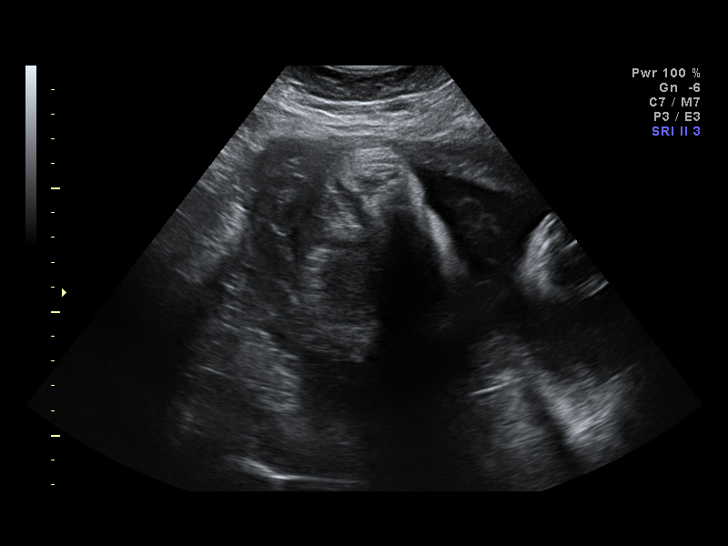
[im 22/29]
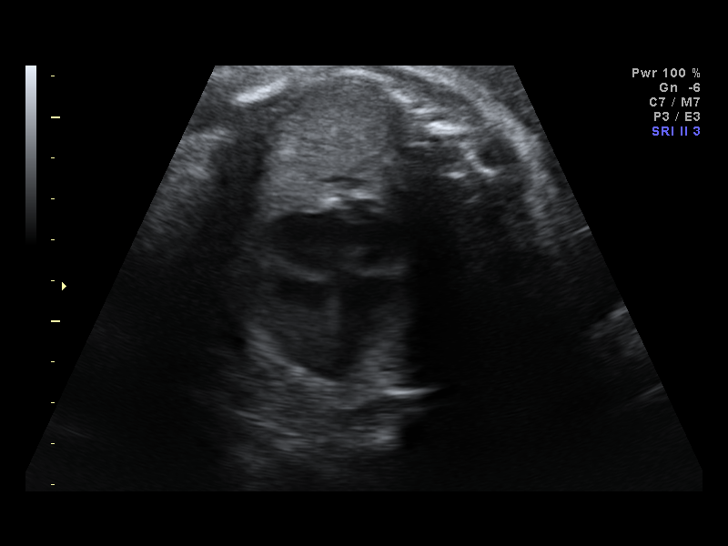
[im 24/29]
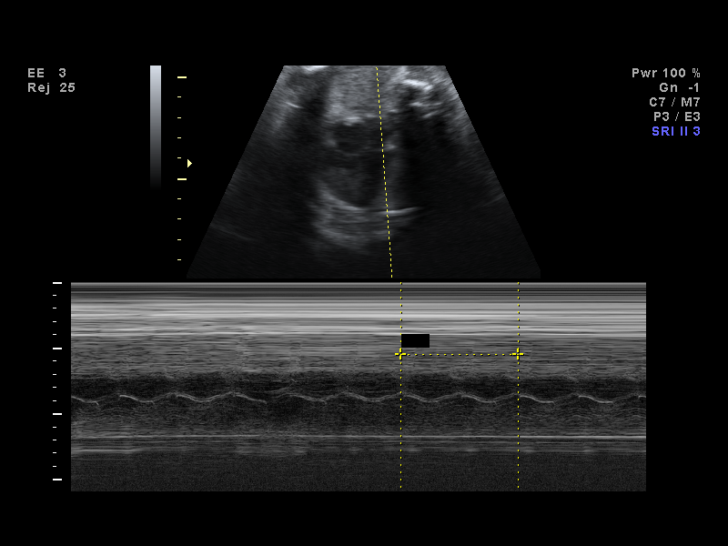
[im 26/29]
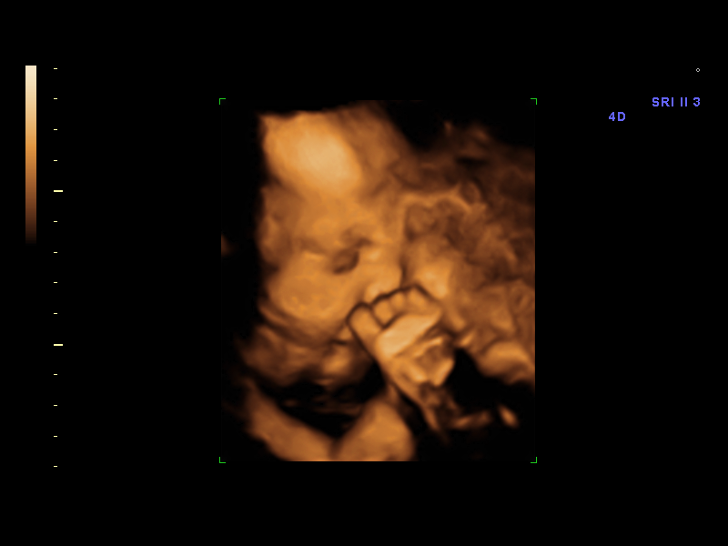
[im 29/29]
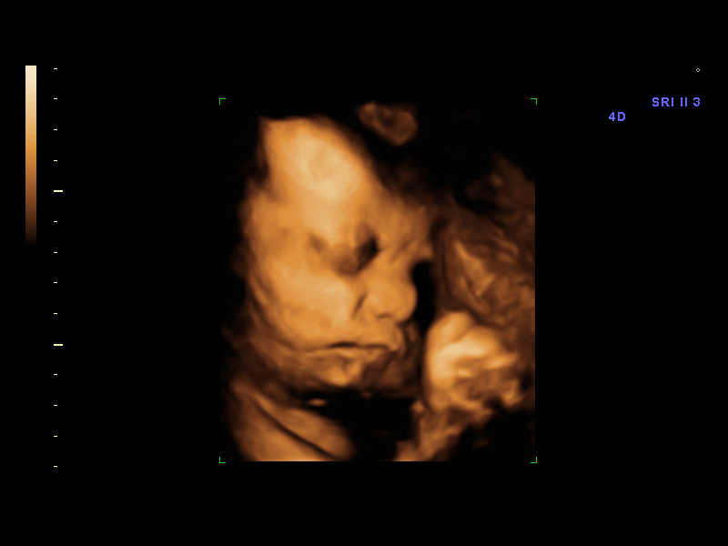

[14 of 28 positions shown; findings below may reference images not displayed]

IMPRESSION: [facility] has also been faxed to the ordering physician.

## 2010-06-13 IMAGING — CR DG CHEST 1V PORT
1 series · 1 of 1 positions shown · non-contrast
Comparison: None

CLINICAL DATA: Breast cancer - Port-A-Cath placement

PORTABLE CHEST - 1 VIEW

[view not recorded]
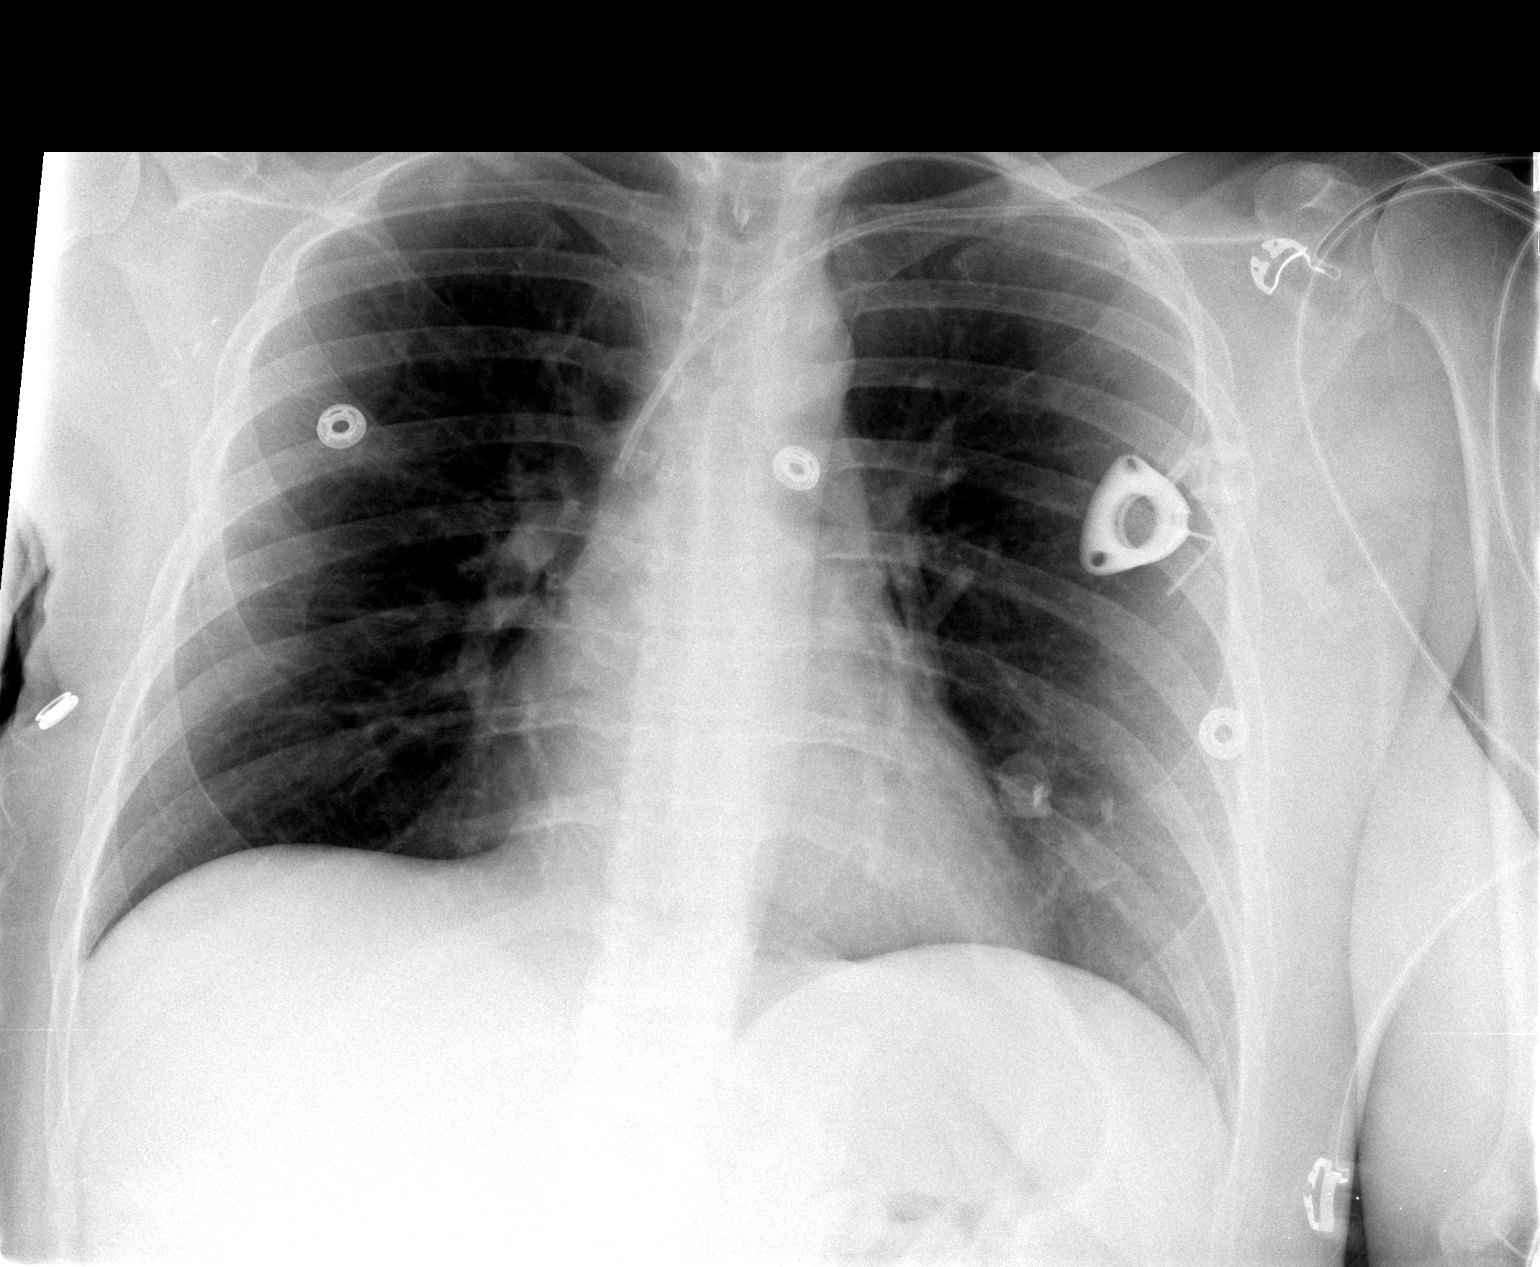

[1 of 1 positions shown; findings below may reference images not displayed]

FINDINGS: A left subclavian Port-A-Cath has been placed with its
tip in the proximal SVC.  No pneumothorax.  Lungs clear except for
1 or 2 calcified densities projecting along the left heart border.
I suspect these are artifacts but they could represent calcified
granulomata.

Heart and mediastinal contours normal.  There are axillary surgical
clips on the right.
IMPRESSION: 1.  Left subclavian Port-A-Cath to the proximal SVC - no
pneumothorax.
2.  Probable artifact versus calcified densities along the left
heart border.  No active disease.

## 2010-07-02 ENCOUNTER — Ambulatory Visit: Payer: Self-pay | Admitting: Oncology

## 2010-07-04 LAB — FOLLICLE STIMULATING HORMONE: FSH: 4.1 m[IU]/mL

## 2010-08-26 ENCOUNTER — Encounter: Admission: RE | Admit: 2010-08-26 | Discharge: 2010-08-26 | Payer: Self-pay | Admitting: Oncology

## 2010-09-17 ENCOUNTER — Ambulatory Visit: Payer: Self-pay | Admitting: Obstetrics & Gynecology

## 2010-09-17 ENCOUNTER — Other Ambulatory Visit: Admission: RE | Admit: 2010-09-17 | Discharge: 2010-09-17 | Payer: Self-pay | Admitting: Obstetrics & Gynecology

## 2010-12-22 ENCOUNTER — Encounter: Payer: Self-pay | Admitting: Oncology

## 2010-12-23 ENCOUNTER — Encounter: Payer: Self-pay | Admitting: Oncology

## 2011-01-07 ENCOUNTER — Encounter (HOSPITAL_BASED_OUTPATIENT_CLINIC_OR_DEPARTMENT_OTHER): Payer: Medicaid Other | Admitting: Oncology

## 2011-01-07 ENCOUNTER — Other Ambulatory Visit: Payer: Self-pay | Admitting: Oncology

## 2011-01-07 ENCOUNTER — Ambulatory Visit (HOSPITAL_COMMUNITY)
Admission: RE | Admit: 2011-01-07 | Discharge: 2011-01-07 | Disposition: A | Discharge status: Home / Self Care | Payer: Medicaid Other | Source: Ambulatory Visit | Attending: Oncology | Admitting: Oncology

## 2011-01-07 DIAGNOSIS — Z853 Personal history of malignant neoplasm of breast: Secondary | ICD-10-CM | POA: Insufficient documentation

## 2011-01-07 DIAGNOSIS — R52 Pain, unspecified: Secondary | ICD-10-CM

## 2011-01-07 DIAGNOSIS — C50419 Malignant neoplasm of upper-outer quadrant of unspecified female breast: Secondary | ICD-10-CM

## 2011-01-07 DIAGNOSIS — M25559 Pain in unspecified hip: Secondary | ICD-10-CM | POA: Insufficient documentation

## 2011-03-05 LAB — CROSSMATCH

## 2011-03-07 LAB — CBC
HCT: 36.8 % (ref 36.0–46.0)
MCV: 86.9 fL (ref 78.0–100.0)
RBC: 4.23 MIL/uL (ref 3.87–5.11)
WBC: 10 10*3/uL (ref 4.0–10.5)

## 2011-03-08 LAB — CBC
HCT: 28.7 % — ABNORMAL LOW (ref 36.0–46.0)
MCV: 88 fL (ref 78.0–100.0)
Platelets: 251 10*3/uL (ref 150–400)
RDW: 14.1 % (ref 11.5–15.5)

## 2011-03-09 LAB — COMPREHENSIVE METABOLIC PANEL
AST: 21 U/L (ref 0–37)
Albumin: 2.6 g/dL — ABNORMAL LOW (ref 3.5–5.2)
Calcium: 9 mg/dL (ref 8.4–10.5)
Creatinine, Ser: 0.56 mg/dL (ref 0.4–1.2)
GFR calc Af Amer: >60 mL/min (ref >60)
GFR calc non Af Amer: >60 mL/min (ref >60)

## 2011-03-09 LAB — URINALYSIS, ROUTINE W REFLEX MICROSCOPIC
Bilirubin Urine: NEGATIVE
Hgb urine dipstick: NEGATIVE
Ketones, ur: 15 mg/dL — AB
Protein, ur: 30 mg/dL — AB
Urobilinogen, UA: 0.2 mg/dL (ref 0.0–1.0)

## 2011-03-09 LAB — DIFFERENTIAL
Band Neutrophils: 3 % (ref 0–10)
Eosinophils Absolute: 0 10*3/uL (ref 0.0–0.7)
Eosinophils Relative: 0 % (ref 0–5)
Metamyelocytes Relative: 0 %
Monocytes Absolute: 0.3 10*3/uL (ref 0.1–1.0)
Monocytes Relative: 2 % — ABNORMAL LOW (ref 3–12)
nRBC: 0 /100 WBC

## 2011-03-09 LAB — CBC
MCHC: 33.3 g/dL (ref 30.0–36.0)
MCV: 89.2 fL (ref 78.0–100.0)
Platelets: 319 10*3/uL (ref 150–400)
RDW: 13.7 % (ref 11.5–15.5)

## 2011-03-09 LAB — URINE MICROSCOPIC-ADD ON

## 2011-04-15 NOTE — Op Note (Signed)
Breanna Lara, Breanna Lara               ACCOUNT NO.:  [id number]   MEDICAL RECORD NO.:  [id number]          PATIENT TYPE:  INP   LOCATION:  9320                          FACILITY:  [facility]   PHYSICIAN:  Currie Paris, M.D.DATE OF BIRTH:  21-Sep-1971   DATE OF PROCEDURE:  06/18/2009  DATE OF DISCHARGE:                               OPERATIVE REPORT   PREOPERATIVE DIAGNOSIS:  Multifocal right breast cancer status post  right lumpectomy for breast cancer in 2001.   POSTOPERATIVE DIAGNOSIS:  Multifocal right breast cancer status post  right lumpectomy for breast cancer in 2001.   OPERATION:  Right total mastectomy.   SURGEON:  Currie Paris, MD   ANESTHESIA:  General.   CLINICAL HISTORY:  This is a 40 year old young lady with a recently  diagnosed fairly rapidly growing receptor-negative right breast cancer  in the upper inner quadrant with a second suspicious area in the lower  half of the breast.  She is status post a right lumpectomy for a breast  cancer in the upper outer quadrant that was receptor positive.  After  discussion with her OB as well as her oncologist, we elected to proceed  at this point to a mastectomy.  The patient is [redacted] weeks pregnant  approximately.   DESCRIPTION OF PROCEDURE:  I saw the patient in the holding area and  reviewed the surgery and answered questions.  We initialed the right  breast as the operative site.   The patient was taken to the operating room, and after satisfactory  general anesthesia had been obtained and appropriate fetal monitoring  obtained, the right breast was prepped and draped.  The time-out was  done.   I outlined an elliptical incision with a fairly high incision to go  around the tumor which was in the upper inner quadrant, but the inferior  incision was just at the edge of the areola.  I raised the superior flap  medially to the sternum, superiorly towards the clavicle, and laterally  into the axilla.  The inferior  incision was then made, and the inferior  flap raised to below the inframammary fold and laterally to the  latissimus.   The breast was then removed from medial to lateral taking the fascia and  using cautery.  I opened the clavipectoral fascia at the edge of the  pectoralis, and there was a little bit of fatty tissue remaining in the  axilla from her prior axillary dissection.  I did identify the axillary  vein and stayed below that and saw the long thoracic thoracodorsal  nerves which were intact.  I carefully palpated high up into the axilla  to be sure I did not overlook any residual high-lying nodes and I could  palpate no abnormalities up there.   Once the breast was removed, it was oriented for pathology.  I then  irrigated copiously made sure everything was dry.  I placed a 19 Blake  drain.  I put some Tisseel on the muscle tissues and then tried to lie  the flaps down on top of that and then closed with staples.  Sterile dressings were applied.  The patient tolerated the procedure  well.  There were no abnormalities reported on the fetal monitoring.      Currie Paris, M.D.  Electronically Signed     CJS/MEDQ  D:  06/18/2009  T:  06/18/2009  Job:  [id number]   cc:   Lesly Dukes, M.D.   Rolm Baptise, MD

## 2011-04-15 NOTE — Assessment & Plan Note (Signed)
Breanna Lara, Breanna Lara               ACCOUNT NO.:  [id number]   MEDICAL RECORD NO.:  [id number]          PATIENT TYPE:  POB   LOCATION:  [facility] at [place]         FACILITY:  [facility]   PHYSICIAN:  Elsie Lincoln, MD      DATE OF BIRTH:  07-16-1971   DATE OF SERVICE:  06/06/2009                                  CLINIC NOTE   The patient is a 40 year old female who is approximately 20 weeks'  pregnant who presents for MD consult for recurrent invasive ductal  carcinoma of the right breast.  Interestingly, it is ER and PR negative.  Her first scans were ER and PR positive, so this sounds like a secondary  primary.  The patient has already met with her surgeon, Cyndia Bent, MD, and  and I will be calling him today to set up surgery.  The  patient is meeting with Dr. Truett Perna, her oncologist.  He will be  starting on chemotherapy.  This Friday, she will be meeting with MFM,  Dr. Hilma Favors, as she can go over the risks and benefits of the  chemotherapy.  I did speak with Dr. Katrinka Blazing, the MFM on-call today, and  the patient should go ahead with surgery as soon as possible.  There is  very little risk of preterm labor.  We will give her steroids prior to  the surgery and monitor the baby intraoperatively and intervene with  fetal distress, and the patient should undergo chemotherapy as directed  by her oncologist.  Once we know what chemotherapies will be given, we  can decide when they should be stopped before delivery, but most likely  in the 39 weeks, but no chemotherapy 3 weeks prior, so all blood counts  will be had around delivery.  The patient now wants to be an MD patient  on Wednesday at the [facility] and we will be happy to see her  here on those days.  The patient is going to get BRCA 1 and 2 testing  through [facility] given that she has Medicaid and they will not pay  for that testing.  There is no obstetrical issue today.  Blood pressure  is 115/75, good fetal  heart tones, and urine is negative for protein and  glucose.  The patient reports good fetal movement and contractions, no  leakage of fluid and no vaginal bleeding.           ______________________________  Elsie Lincoln, MD     KL/MEDQ  D:  06/06/2009  T:  06/07/2009  Job:  [id number]

## 2011-04-15 NOTE — Assessment & Plan Note (Signed)
NAME:  Breanna Lara, Breanna Lara NO.:  [id number]   MEDICAL RECORD NO.:  [id number]          PATIENT TYPE:  POB   LOCATION:  [facility] at [place]         FACILITY:  [facility]   PHYSICIAN:  Elsie Lincoln, MD      DATE OF BIRTH:  1971/04/08   DATE OF SERVICE:                                  CLINIC NOTE   The patient is a 40 year old female who is 7 weeks' postpartum from  NSVD.  The patient is induced at 37 weeks for recurrent breast cancer.  The patient is status post mastectomy during the pregnancy.  She is  currently undergoing chemotherapy with carboplatin and Taxol.  She has  her 4th chemotherapy tomorrow.  She is tolerating it well.  She had BRCA  I and II testing through [facility].  They had a grant for this.  She  will have the results back next week and she will let us know if is BRCA  I and II positive.  If she is positive, she will undergo a prophylactic  mastectomy of her other breast and also bilateral salpingo-oophorectomy.  The patient is doing well postpartum.  She was breastfeeding for 4  weeks, but stopped now that she started the chemotherapy.  She has had  sexual intercourse once, but she did not use condoms.  I advised her  this was not wise and they have brought condoms and have been using them  since.  The patient has a history of an ASCUS Pap smear in January 2010.  There was no HPV testing done.  She has had a history of low grade in  the past as well.  I will do a Pap smear today and send HPV testing.   PHYSICAL EXAMINATION:  VITAL SIGNS:  Her pulse is 88, blood pressure  123/79, weight 166, height 67 inches.  GENERAL:  Well nourished, well developed, no apparent distress.  HEENT:  Hair is started to fall from the chemotherapy.  CHEST:  Normal movement.  ABDOMEN:  Soft, nontender.  No organomegaly.  No hernia.  PELVIC:  Genitalia, Tanner V.  Vagina; pink, normal rugae.  Cervix,  external os slightly opened and internal os closed.  Cervix, long.  Uterus, nontender.  Adnexa; no masses, nontender.   ASSESSMENT AND PLAN:  A 40 year old female with recurrent breast cancer,  undergoing chemotherapy postpartum visit.  1. No problems with depression.  2. Bottle feeding going well.  3. Condoms for birth control.  4. Breast cancer antigen I and II testing pending.  5. Pap smear today and we will follow up according to [facility]      guidelines.           ______________________________  Elsie Lincoln, MD     KL/MEDQ  D:  09/05/2009  T:  09/06/2009  Job:  [id number]

## 2011-04-15 NOTE — Discharge Summary (Signed)
[name], Breanna Lara               ACCOUNT NO.:  [id number]   MEDICAL RECORD NO.:  [id number]           PATIENT TYPE:   LOCATION:                                 FACILITY:   PHYSICIAN:  Currie Paris, M.D.DATE OF BIRTH:  19-Dec-1970   DATE OF ADMISSION:  DATE OF DISCHARGE:                               DISCHARGE SUMMARY   FINAL DIAGNOSES:  1. Multifocal invasive ductal carcinoma, right breast stage II.  2. Intrauterine pregnancy at 30 weeks.   CLINICAL HISTORY:  [name] is a 40 year old lady who is about 30  weeks' pregnant.  She recently was diagnosed with 2 separate breast  masses, both of which appeared to be malignant, one of which was biopsy  proven malignant.  She is about 8 years status post lumpectomy radiation  for a right breast cancer in the upper outer quadrant.  These 2 new  cancers were in upper inner and lower inner.   HOSPITAL COURSE:  The patient was admitted and taken to the operating  room where a total mastectomy was done.  Here there was no evidence of  any adenopathy and she had had a previous node dissection.  Fetal  monitoring was done, and there were no issues that developed there.   The patient did well postoperatively.  She was able to be discharged the  day following surgery.  Her wounds were okay and her drains were clear.  She is to be followed up in my office in approximately 1 week.  She is  following up with both, Dr. Truett Lara and Dr. Penne Lash postoperatively.  Pathology report shows invasive ductal carcinoma x2 with lymphovascular  invasion, one of which is 2.5 cm, the other 1.6 cm.  It is grade 3.  It  is ER/PR negative.      Currie Paris, M.D.  Electronically Signed     CJS/MEDQ  D:  07/04/2009  T:  07/05/2009  Job:  [id number]   cc:   Breanna Lara, M.D.   Breanna Lara, M.D.  Fax: [phone]

## 2011-04-15 NOTE — Assessment & Plan Note (Signed)
NAME:  Breanna Lara, Breanna Lara NO.:  [id number]   MEDICAL RECORD NO.:  [id number]          PATIENT TYPE:  POB   LOCATION:  [facility] at [place]         FACILITY:  [facility]   PHYSICIAN:  Elsie Lincoln, MD      DATE OF BIRTH:  04-Jan-1971   DATE OF SERVICE:  09/17/2010                                  CLINIC NOTE   The patient is a 40 year old G3, para 2-0-1-2 who has had breast cancer  twice on the right breast status post mastectomy, who is here for annual  exam.  The patient was ER positive, I believe, for her first breast  cancer and then triple negative for second breast cancer.  She states  that she has negative BRCA 1 and 2 on genetic testing.  She was  amenorrheic, she restarted her period in June where it started to become  regular again.  She had FSH level drawn by Dr. Elnita Maxwell and nurse told her  it was normal.  I will call Dr. Elnita Maxwell to make sure that it is okay that  she is menstruating.  The patient uses condoms and rhythm method for  birth control.  She is not interested in IUD and her partner does not  want a vasectomy.  She has complaints of a hemorrhoid, itching, burning,  but no bleeding.   PAST MEDICAL HISTORY:  Breast cancer x2, irritable bowel.   PAST SURGICAL HISTORY:  Mastectomy, toe surgery, laparoscopic  salpingectomy for ectopic, cholecystectomy, eye surgery, and deviated  septum repair.   GYNECOLOGIC HISTORY:  Ectopic pregnancy, NSVD x2.  The patient had an  abnormal Pap smear, low grade, in 2009 that needed colpo.  She has  history of HSV2.   MEDICATIONS:  Multivitamin, MiraLax.   ALLERGIES:  None.   PHYSICAL EXAMINATION:  VITAL SIGNS:  Pulse 65, blood pressure 110/74,  weight 172, height 67 inches.  GENERAL:  Well-nourished, well-developed, in no apparent stress.  HEENT:  Normocephalic, atraumatic.  NECK:  Thyroid, no masses.  LUNGS:  Clear auscultation bilaterally.  HEART:  Regular rate and rhythm.  BREASTS:  Left breasts, no masses, no  lymphadenopathy, no skin changes.  The patient states her last mammogram was normal.  Right breast  surgically removed.  There is some tightness and scarring from her old  mastectomy.  ABDOMEN:  Soft, nontender.  No Organomegaly.  No hernia.  GENITALIA:  Tanner V.  Vagina pink, normal rugae.  Cervix large,  multipara.  Uterus slightly retroverted, nontender.  Adnexa, no masses,  nontender, both ovaries felt.  Rectal exam, small internal hemorrhoid at  12 o'clock.  EXTREMITIES:  No edema.   ASSESSMENT AND PLAN:  A 40 year old female, well-woman exam.  1. Pap smear done.  2. ProctoFoam-HC for hemorrhoid.  3. Followup with Oncology and breast surgeon as indicated for breast      cancer.  4. We will call Dr. Elnita Maxwell regarding the patient menstruating and if      this is okay to have estrogen in the presence of a history of ER      positive breast cancer.  5. Flu shot.  6. Return to clinic in a year.  ______________________________  Elsie Lincoln, MD     KL/MEDQ  D:  09/17/2010  T:  09/18/2010  Job:  [id number]

## 2011-04-18 NOTE — Op Note (Signed)
[place]  Patient:    Breanna Lara, Breanna Lara                   MRN: [id number] Proc. Date: 06/15/00 Adm. Date:  [id number] Attending:  Charlton Haws CC:         Leighton Roach. Truett Perna, M.D.                           Operative Report  PREOPERATIVE DIAGNOSIS:  Carcinoma of the right breast with close inferior margin.  POSTOPERATIVE DIAGNOSIS:  Carcinoma of the right breast with close inferior margin.  OPERATION:  Re-excision of right breast cancer and placement of Port-A-Cath.  SURGEON:  Currie Paris, M.D.  ANESTHESIA:  General endotracheal.  CLINICAL HISTORY:  The patient is a 40 year old who has undergone excision of her right breast cancer, which was a stage II.  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  It was decided to re-excise the inferior margin and place the Port-A-Cath.  DESCRIPTION OF PROCEDURE:  The patient was brought to the operating room and after satisfactory general endotracheal anesthesia obtained, both breasts and upper chest and lower neck area were prepped and draped as a single sterile field.  The patient was placed in reverse Trendelenburg.  The left subclavian vein was entered on the initial try, and a guidewire threaded into the superior vena cava.  Transverse incision made over the skin and subcutaneous tissue, divided down to fascia, and a pocket fashioned for the reservoir.  The guidewire site was enlarged with a knife and then the tubing brought from the Port-A-Cath reservoir site into the guidewire site.  The guidewire tract was dilated once with a #10 dilator, followed by the dilator with a peel-away sheath.  The dilator and guidewire were removed, and the catheter threaded easily into the superior vena cava.  It aspirated easily, and the peel-away sheath was removed.  The reservoir was flushed and attached to the catheter and the  locking mechanism engaged.  It aspirated and irrigated easily.  It was sutured to the fascia with 2-0 Prolene using three sutures.  The wound was then closed with 3-0 Vicryl, followed by 4-0 Monocryl subcuticular.  The catheter was flushed a final time with dilute heparin, about 15 cc, followed by 5 cc of heparin lock.  Attention was then turned to the right breast, and the staples were removed. An old seroma, which was actually beginning to more fill with gelatinous material, was suctioned out.  The entire inferior margin from the medial corner of the incision to the lateral corner and actually extending a little bit around the medial corner, was excised to a depth of about 1-1.5 cm using cutting current of the cautery.  Hemostasis was achieved with the cautery. The wound was irrigated, and when everything appeared to be dry, I placed clips to mark the new inferior margin.  The wound was closed with staples.  I did inject approximately 10 cc of 0.25% plain Marcaine at the Port-A-Cath site prior to closing that site and another 10 around the muscle and subcutaneous skin of the lumpectomy site.  The patient tolerated the procedure well.  There were no obvious complications.  All counts were correct. DD:  06/15/00 TD:  06/15/00 Job: [id number]

## 2011-04-18 NOTE — Op Note (Signed)
[facility]  Patient:    Breanna Lara, Breanna Lara Visit Number: [id number] MRN: [id number]          Service Type: DSU Location: DAY Attending Physician:  Rosalee Kaufman Dictated by:   Harl Bowie, M.D. Proc. Date: 02/04/02 Admit Date:  02/04/2002   CC:         Jillyn Hidden B. Truett Perna, M.D.   Operative Report  PREOPERATIVE DIAGNOSIS:  Intrauterine pregnancy, for therapeutic abortion.  POSTOPERATIVE DIAGNOSIS:  Intrauterine pregnancy, for therapeutic abortion.  OPERATION:  D&C with suction.  SURGEON:  Harl Bowie, M.D.  ANESTHESIA:  MAC with local.  FINDINGS AND PROCEDURE:  The patient prepped and draped in the usual fashion for a vaginal procedure.  The patient was examined and found to have a uterus about 8 weeks size with the adnexa clear.  Following, a catheter was placed in the bladder and the bladder decompressed.  A speculum was then placed in the vagina and the cervix grasped with a single-tooth tenaculum.  Xylocaine 1% was instilled around the cervix.  The cervix was then sounded to 3.75 inches. The cervix was dilated and the cavity entered with a #8 suction curette.  A moderate amount of tissue was obtained.  No additional tissue was obtained around the Stone forceps and a sharp curette.  Blood loss was 50 cc.  The patient tolerated the procedure well and was sent to the recovery room in good condition. Dictated by:   Harl Bowie, M.D. Attending Physician:  Rosalee Kaufman DD:  02/04/02 TD:  02/04/02 Job: [id number]

## 2011-04-18 NOTE — Op Note (Signed)
[place]  Patient:    Breanna Lara, Breanna Lara                   MRN: [id number] Proc. Date: 06/05/00 Adm. Date:  [id number] Disc. Date: [id number] Attending:  Charlton Haws CC:         Currie Paris, M.D.             Helene Kelp, M.D.             Augustine Radar, M.D.                           Operative Report  PREOPERATIVE DIAGNOSIS:  Carcinoma right breast.  POSTOPERATIVE DIAGNOSIS:  Carcinoma right breast with positive axillary node.  OPERATION:  Right lumpectomy with blue dye injection, sentinel node dissection, and completion of axillary dissection.  SURGEON:  Currie Paris, M.D.  ASSISTANT:  Magnus Ivan, RN, FA  ANESTHESIA:  General endotracheal.  BRIEF HISTORY:  This patient is a 40 year old with a mass in the upper outer quadrant whose core biopsy had shown carcinoma.  After discussion with the patient about alternatives, we would elected to proceed with lumpectomy with sentinel node dissection and possible axillary dissection.  DESCRIPTION OF OPERATION:  The patient was brought to the operating room and after satisfactory general anesthesia was obtained, the breast was prepped and draped.  I injected some lymphocerin blue around the subareola and just in the dermis overlying the tumor and a little into the breast tissue around the tumor.  While waiting the five minutes for that to pass through, I used the needle probe and identified a very hot area in the low anterior axilla.  This was marked.  Axillary incision was made and subcutaneous tissues divided and I saw almost immediately blue lymphatic and traces just barely into the axilla though the subcutaneous fascia and identified two nodes adjacent, one very hot just beginning to turn blue and the second was the lymphatic directly entered and was very bright blue.  It was also hot but not as hot.  Both of these were excised.  Using the needle probe I found two  other hot nodes and in dissecting them out, they both had begun to have some blue dye in them.  Upon removing these nodes, there was no further hot areas noted, nor any areas of blue nodes noted.  Hemostasis was achieved and a pack placed.  Attention was turned to the breast and I made a curvilinear elliptical incision to take a little of the dermis, so I was sure to have a wide superficial margin.  I then raised skin flaps in all four directions and then at the axillary side was able to get down to the muscle and come under the tumor, taking full thickness skin, subcutaneous tissue, breast tissue down to muscle circumferentially and then divided the final attachments superomedially to the breast.  This completed the partial mastectomy.  There was one area of some irregular breast tissue at the superior corner of the excision, which I excised as a separate piece of margin, although I think it is fibrotic breast tissue.  Because the margin grossly was a little close along the superolateral aspect of the excision, I took some more tissue here.  However, grossly, the margin appeared negative and I could move the fatty tissue over the tumor with ease.  The wound was irrigated and some Marcaine injected for postoperative  analgesia.  I put some clips to mark the limits of excision.  Everything appeared to be dry at this point.  Pathology called back to report that at least one of the sentinel nodes had tumor.  I therefore embarked on a completion axillary dissection.  I made the transverse axillary incision longer and divided the subcutaneous tissue down to muscle so I had the inferior edge of the axillary contents freed up from pectoralis anteriorly to latissimus posteriorly.  I then worked along the edge of the subpectoral fascia to identify the axillary vein and swept the axillary contents from medial and superior to inferior and lateral.  The long thoracic and thoracodorsal nerves were  identified and preserved as was the blood supply to the pectoralis.  The secondary costal nerve was not specifically identified in this dissection.  Several nodes were noted to be slightly enlarged but soft.  There was no gross abnormality left behind and I stayed below the axillary vein at all times.  This wound was irrigated and checked for hemostasis.  I used neither clips, cautery nor ties for hemostasis and again everything appeared to be dry.  I injected a little Marcaine into this incision as well.  19 Blake drain was placed through a separate stab wound.  This wound was closed with 3-0 Vicryl followed by staples.  The breast wound was reinspected and had been completely dry during the completion of the axillary dissection and was closed with staples.  Sterile dressings were applied.  The patient tolerated the procedure well.  There were no intraoperative complications.  All counts were correct. DD:  06/05/00 TD:  06/05/00 Job: [id number]

## 2011-04-18 NOTE — Consult Note (Signed)
[name]  Patient:    Breanna Lara, Breanna Lara                   MRN: [id number] Proc. Date: 08/02/00 Adm. Date:  [id number] Disc. Date: [id number] Attending:  Charlton Haws CC:         Leighton Roach. Truett Perna, M.D.                          Consultation Report  DATE OF BIRTH: [birth year]  CHIEF COMPLAINT: Fever.  HISTORY OF PRESENT ILLNESS: The patient is a 40 year old unfortunate woman with breast cancer diagnosed in May 2001.  She had lumpectomy carried out on May 06, 2000 followed by re-excision on May 16, 2000.  She was commenced on adjuvant chemotherapy thereafter and received her second course of Adriamycin and cyclophosphamide on July 16, 2000.  The patient tells me she has allergies and every year around this time she has allergic symptoms.  For the last few weeks she has had "congestion" with runny nose, itching of the nose, sneezing, watery eyes, and itching of her eyes.  There was also sore throat and perhaps postnasal drip.  Last night the patient had rise in temperature up to 100.5 degrees Fahrenheit and there were chills as well.  The patient had headaches.  There was some cough with clear expectoration.  For the last week or so the patient took some Benadryl, Tylenol, and Claritin D with little relief of her symptoms.  She called me and I advised her to come to the emergency room for the fever in post chemotherapy setting.  PAST MEDICAL HISTORY: Noncontributory.  ALLERGIES: No known drug allergies.  SOCIAL HISTORY: Nonsmoker, nondrinker.  She has one daughter and is separated. She is employed.  CURRENT MEDICATIONS:  1. Zantac.  2. Coumadin.  3. Benadryl.  4. Tylenol.  5. Claritin D.  FAMILY HISTORY: Her father had melanoma, diabetes, and kidney disease.  REVIEW OF SYSTEMS: GENERAL: The patient complains of lack of energy and feels tired, with depressed appetite.  As mentioned above, there was some chills and fever  yesterday.  No night sweats.  No weight loss.  ENT: As per History of Present Illness.  Sneezing and itching of her nose and eyes noted, with watery eyes and runny nose.  CARDIOVASCULAR/RESPIRATORY: No chest pain, no shortness of breath, no palpitations.  There was some cough with clear expectoration. GI: Some nausea for a few days after chemotherapy, not lately.  No vomiting. No abdominal pain.  No constipation.  There was some diarrhea.  No hematochezia.  No black stools.  MUSCULOSKELETAL: No complaints.  GU: No complaints.  PHYSICAL EXAMINATION:  GENERAL: Young woman, in no apparent distress.  Alert and oriented x 3.  VITAL SIGNS: Temperature 99.8 degrees, respirations 20, heart rate 104, blood pressure 130/76.  HEENT: Head normocephalic, atraumatic.  Alopecia noted.  Throat minimally erythematous.  Nares with violaceous boggy mucosa.  I cannot see any drainage.  Ears unremarkable.  There is tenderness to palpation of the frontal sinuses but maxillary sinuses are nontender to palpation.  NECK: No JVD, no lymphadenopathy, no thyromegaly, no bruits.  CHEST: Clear to auscultation bilaterally.  HEART: Regular.  ABDOMEN: Soft, benign.  No organomegaly, no masses.  EXTREMITIES: No peripheral edema.  LABORATORY DATA: WBC 2.6 with 1.2% neutrophils, 0.6% lymphocytes; hemoglobin 10.3; platelets 339,000.  CMET remarkable for potassium of 3.4, albumin 3.3, calcium 8.2; otherwise normal.  LDH  203.  IMPRESSION/PLAN:  1. Leukopenia with borderline neutropenia in a patient with stage 2 breast     cancer, on cycle two day 18 of chemotherapy with AC.  2. Allergic rhinitis.  3. Possible sinusitis occurring in a period of neutropenia.  4. Anemia.  I will start the patient on levofloxacin 500 mg p.o. for ten days to treat sinusitis and will also give her antihistamines and decongestants for her rhinitis, along with topical steroids to the nose.  I will give the patient Claritin D, Flonase.   Per patient request will give her fluconazole to prevent vaginal yeast infection.  Will also give her cough suppressants, codeine to take q.4h to q.6h as-needed for cough.  Prior to discharge from the emergency room will give her G-CSF 300 mcg subcutaneously.  The patient will follow up with Dr. Truett Perna in a few days in his office and will be discharged home from the emergency room.  She will continue her pre emergency room visit medications. DD:  08/02/00 TD:  08/03/00 Job: [id number]

## 2011-07-08 ENCOUNTER — Other Ambulatory Visit: Payer: Self-pay | Admitting: Oncology

## 2011-07-08 ENCOUNTER — Encounter (HOSPITAL_BASED_OUTPATIENT_CLINIC_OR_DEPARTMENT_OTHER): Payer: Medicaid Other | Admitting: Oncology

## 2011-07-08 DIAGNOSIS — C50919 Malignant neoplasm of unspecified site of unspecified female breast: Secondary | ICD-10-CM

## 2011-07-08 DIAGNOSIS — C50419 Malignant neoplasm of upper-outer quadrant of unspecified female breast: Secondary | ICD-10-CM

## 2011-07-08 DIAGNOSIS — M25559 Pain in unspecified hip: Secondary | ICD-10-CM

## 2011-07-11 ENCOUNTER — Ambulatory Visit (HOSPITAL_COMMUNITY)
Admission: RE | Admit: 2011-07-11 | Discharge: 2011-07-11 | Disposition: A | Discharge status: Home / Self Care | Payer: Medicaid Other | Source: Ambulatory Visit | Attending: Oncology | Admitting: Oncology

## 2011-07-11 DIAGNOSIS — C50919 Malignant neoplasm of unspecified site of unspecified female breast: Secondary | ICD-10-CM

## 2011-07-11 DIAGNOSIS — Z9010 Acquired absence of unspecified breast and nipple: Secondary | ICD-10-CM | POA: Insufficient documentation

## 2011-07-30 ENCOUNTER — Encounter: Payer: Self-pay | Admitting: *Deleted

## 2011-08-20 ENCOUNTER — Encounter: Payer: Self-pay | Admitting: Obstetrics & Gynecology

## 2011-08-20 ENCOUNTER — Ambulatory Visit (INDEPENDENT_AMBULATORY_CARE_PROVIDER_SITE_OTHER): Payer: Medicaid Other | Admitting: Obstetrics & Gynecology

## 2011-08-20 VITALS — BP 126/85 | HR 83 | Temp 98.4°F | Resp 16 | Ht 67.0 in | Wt 183.0 lb

## 2011-08-20 DIAGNOSIS — R1084 Generalized abdominal pain: Secondary | ICD-10-CM

## 2011-08-20 NOTE — Progress Notes (Signed)
  Subjective:    Patient ID: Cleatrice Burke, female    DOB: 04/01/1971, 40 y.o.   MRN: [id number]  HPI  This presents for mild lower abdominal pain. The pain is constant. It does not radiate anywhere. Diffusely across her lower quadrants. Patient normal menstrual cycles. She does have a history of irritable bowel syndrome. She is unsure with pain is from irritable bowel work or her pelvic organs. The patient does not have pain with bowel movements. She is approximately 1 soft on the day. There's no pain with urination. Successful intercourse is not painful.  Review of Systems  Constitutional: Negative.   Respiratory: Negative.   Cardiovascular: Negative.   Gastrointestinal:       Lower abdominal pain  Genitourinary: Positive for pelvic pain.  Musculoskeletal: Negative.        Objective:   Physical Exam  Constitutional: She appears well-developed and well-nourished. No distress.  HENT:  Head: Normocephalic and atraumatic.  Eyes: Conjunctivae are normal.  Pulmonary/Chest: Breath sounds normal.  Abdominal: She exhibits no distension and no mass. There is no tenderness. There is no rebound and no guarding.  Genitourinary: Vagina normal and uterus normal.       Adnexa is clear, no masses.  Minimal discomfort to deep palpation.          Assessment & Plan:  Lanice Schwab is a 40 year old female, who had complaints of mild pelvic and abdominal pain. There was no obvious physical exam findings. To be 800 and antrum.  1.  Patient due for annual exam in November. We'll reassess her pain then. If pain still present we will order ultrasound of her pelvis. 2. Patient to use condoms for birth control in lieu of rhythm method.

## 2011-09-22 ENCOUNTER — Ambulatory Visit: Payer: Medicaid Other | Attending: Oncology | Admitting: Physical Therapy

## 2011-09-22 DIAGNOSIS — IMO0001 Reserved for inherently not codable concepts without codable children: Secondary | ICD-10-CM | POA: Insufficient documentation

## 2011-09-22 DIAGNOSIS — I890 Lymphedema, not elsewhere classified: Secondary | ICD-10-CM | POA: Insufficient documentation

## 2011-09-22 DIAGNOSIS — M79609 Pain in unspecified limb: Secondary | ICD-10-CM | POA: Insufficient documentation

## 2011-09-29 ENCOUNTER — Ambulatory Visit: Payer: Medicaid Other | Admitting: Physical Therapy

## 2011-09-30 ENCOUNTER — Other Ambulatory Visit: Payer: Self-pay | Admitting: Oncology

## 2011-09-30 ENCOUNTER — Ambulatory Visit (HOSPITAL_BASED_OUTPATIENT_CLINIC_OR_DEPARTMENT_OTHER): Payer: Medicaid Other | Admitting: Oncology

## 2011-09-30 DIAGNOSIS — C50919 Malignant neoplasm of unspecified site of unspecified female breast: Secondary | ICD-10-CM

## 2011-09-30 DIAGNOSIS — Z853 Personal history of malignant neoplasm of breast: Secondary | ICD-10-CM

## 2011-09-30 DIAGNOSIS — M25559 Pain in unspecified hip: Secondary | ICD-10-CM

## 2011-09-30 DIAGNOSIS — C50419 Malignant neoplasm of upper-outer quadrant of unspecified female breast: Secondary | ICD-10-CM

## 2011-09-30 DIAGNOSIS — M542 Cervicalgia: Secondary | ICD-10-CM

## 2011-09-30 DIAGNOSIS — T451X5A Adverse effect of antineoplastic and immunosuppressive drugs, initial encounter: Secondary | ICD-10-CM

## 2011-09-30 DIAGNOSIS — D6481 Anemia due to antineoplastic chemotherapy: Secondary | ICD-10-CM

## 2011-09-30 LAB — COMPREHENSIVE METABOLIC PANEL
ALT: 24 U/L (ref 0–35)
AST: 24 U/L (ref 0–37)
Albumin: 3.7 g/dL (ref 3.5–5.2)
Alkaline Phosphatase: 72 U/L (ref 39–117)
BUN: 9 mg/dL (ref 6–23)
Potassium: 3.4 mEq/L — ABNORMAL LOW (ref 3.5–5.3)
Sodium: 138 mEq/L (ref 135–145)
Total Protein: 7.7 g/dL (ref 6.0–8.3)

## 2011-09-30 LAB — CBC WITH DIFFERENTIAL/PLATELET
Basophils Absolute: 0 10*3/uL (ref 0.0–0.1)
Eosinophils Absolute: 0.3 10*3/uL (ref 0.0–0.5)
HCT: 38.7 % (ref 34.8–46.6)
LYMPH%: 28.6 % (ref 14.0–49.7)
MCV: 85.8 fL (ref 79.5–101.0)
MONO#: 0.6 10*3/uL (ref 0.1–0.9)
MONO%: 5.5 % (ref 0.0–14.0)
NEUT#: 6.5 10*3/uL (ref 1.5–6.5)
NEUT%: 63 % (ref 38.4–76.8)
Platelets: 290 10*3/uL (ref 145–400)
WBC: 10.3 10*3/uL (ref 3.9–10.3)

## 2011-10-01 ENCOUNTER — Encounter: Payer: Self-pay | Admitting: Obstetrics & Gynecology

## 2011-10-01 ENCOUNTER — Ambulatory Visit (INDEPENDENT_AMBULATORY_CARE_PROVIDER_SITE_OTHER): Payer: Medicaid Other | Admitting: Obstetrics & Gynecology

## 2011-10-01 ENCOUNTER — Other Ambulatory Visit (HOSPITAL_COMMUNITY)
Admission: RE | Admit: 2011-10-01 | Discharge: 2011-10-01 | Disposition: A | Discharge status: Home / Self Care | Payer: Medicaid Other | Source: Ambulatory Visit | Attending: Obstetrics & Gynecology | Admitting: Obstetrics & Gynecology

## 2011-10-01 VITALS — BP 107/75 | HR 85 | Temp 97.6°F | Resp 16 | Ht 67.0 in | Wt 183.0 lb

## 2011-10-01 DIAGNOSIS — Z01419 Encounter for gynecological examination (general) (routine) without abnormal findings: Secondary | ICD-10-CM | POA: Insufficient documentation

## 2011-10-01 DIAGNOSIS — Z23 Encounter for immunization: Secondary | ICD-10-CM

## 2011-10-01 DIAGNOSIS — C50411 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 female breast: Secondary | ICD-10-CM | POA: Insufficient documentation

## 2011-10-01 DIAGNOSIS — Z1159 Encounter for screening for other viral diseases: Secondary | ICD-10-CM | POA: Insufficient documentation

## 2011-10-01 DIAGNOSIS — C50919 Malignant neoplasm of unspecified site of unspecified female breast: Secondary | ICD-10-CM

## 2011-10-01 MED ORDER — INFLUENZA VIRUS VACC SPLIT PF IM SUSP: 0.5000 mL | Freq: Once | INTRAMUSCULAR | Status: DC

## 2011-10-01 NOTE — Patient Instructions (Signed)
Intrauterine Device Insertion Most often, an intrauterine device (IUD) is inserted into the uterus to prevent pregnancy. There are 2 types of IUDs available:  Copper IUD. This type of IUD creates an environment that is not favorable to sperm survival. The mechanism of action of the copper IUD is not known for certain. It can stay in place for 10 years.   Hormone IUD. This type of IUD contains the hormone progestin (synthetic progesterone). The progestin thickens the cervical mucus and prevents sperm from entering the uterus, and it also thins the uterine lining. There is no evidence that the hormone IUD prevents implantation. The hormone IUD can stay in place for up to 5 years.  An IUD is the most cost-effective birth control if left in place for the full duration. It may be removed at any time. LET YOUR CAREGIVER KNOW ABOUT:  Sensitivity to metals.   Medicines taken including herbs, eyedrops, over-the-counter medicines, and creams.   Use of steroids (by mouth or creams).   Previous problems with anesthetics or numbing medicine.   Previous gynecological surgery.   History of blood clots or clotting disorders.   Possibility of pregnancy.   Menstrual irregularities.   Concerns regarding unusual vaginal discharge or odors.   Previous experience with an IUD.   Other health problems.  RISKS AND COMPLICATIONS  Accidental puncture (perforation) of the uterus.   Accidental placement of the IUD either in the muscle layer of the uterus (myometrium) or outside the uterus. If this happen, the IUD can be found essentially floating around the bowels. When this happens, the IUD must be taken out surgically.   The IUD may fall out of the uterus (expulsion). This is more common in women who have recently had a child.    Pregnancy in the fallopian tube (ectopic).  BEFORE THE PROCEDURE  Schedule the IUD insertion for when you will have your menstrual period or right after, to make sure you  are not pregnant. Placement of the IUD is better tolerated shortly after a menstrual cycle.   You may need to take tests or be examined to make sure you are not pregnant.   You may be required to take a pregnancy test.   You may be required to get checked for sexually transmitted infections (STIs) prior to placement. Placing an IUD in someone who has an infection can make an infection worse.   You may be given a pain reliever to take 1 or 2 hours before the procedure.   An exam will be performed to determine the size and position of your uterus.   Ask your caregiver about changing or stopping your regular medicines.  PROCEDURE   A tool (speculum) is placed in the vagina. This allows your caregiver to see the lower part of the uterus (cervix).   The cervix is prepped with a medicine that lowers the risk of infection.   You may be given a medicine to numb each side of the cervix (intracervical or paracervical block). This is used to block and control any discomfort with insertion.   A tool (uterine sound) is inserted into the uterus to determine the length of the uterine cavity and the direction the uterus may be tilted.   A slim instrument (IUD inserter) is inserted through the cervical canal and into your uterus.   The IUD is placed in the uterine cavity and the insertion device is removed.   The nylon string that is attached to the IUD, and used for   eventual IUD removal, is trimmed. It is trimmed so that it lays high in the vagina, just outside the cervix.  AFTER THE PROCEDURE  You may have bleeding after the procedure. This is normal. It varies from light spotting for a few days to menstrual-like bleeding.   You may have mild cramping.   Practice checking the string coming out of the cervix to make sure the IUD remains in the uterus. If you cannot feel the string, you should schedule a "string check" with your caregiver.   If you had a hormone IUD inserted, expect that your  period may be lighter or nonexistent within a year's time (though this is not always the case). There may be delayed fertility with the hormone IUD as a result of its progesterone effect. When you are ready to become pregnant, it is suggested to have the IUD removed up to 1 year in advance.   Yearly exams are advised.  Document Released: 07/16/2011 Document Revised: 07/30/2011 Document Reviewed: 07/16/2011 [facility] Patient Information 2012 [place], [place].Contraceptive Devices (IUD) IUD stands for intrauterine device. Intrauterine means inside the womb (uterus). The purpose of the IUD is to prevent pregnancy. IUDs make it more difficult for your partner's sperm to get into your womb and into your fallopian tubes, where the eggs are fertilized. IUDs also alter the secretions of your cervix, which make it a stronger sperm barrier. They also affect the lining of the womb, so it is harder for an egg to implant. The IUD does not cause an abortion. There are 2 types of IUDs available:  Copper IUD gives off a small amount of copper inside the uterus. This prevents the sperm from going through the uterus, up into the fallopian tube, where the egg is fertilized. The copper IUD can also damage or prevent the fertilized egg from attaching on the inside lining of the uterus. It can stay in place for 10 years. The copper IUD can be used as an emergency contraceptive, if inserted within 5 days after having unprotected sexual intercourse.   Hormone IUD contains progestin (synthetic progesterone), and it releases this hormone into the uterus. The hormone thickens the mucus on the cervix and prevents sperm from entering the uterus. It also weakens the sperm that get into the uterus, so that the sperm and egg cannot live in the fallopian tube. It also makes the inside lining of the uterus thinner, which makes it difficult for a fertilized egg to attach to the uterus. The hormone progestin in the IUD decreases the amount of  bleeding during a menstrual period and can be helpful in women who have heavy menstrual periods. The hormone IUD can stay in place for 5 years.  SIDE EFFECTS OF THE IUD:  There may be more cramping or pain with periods.   It may cause heavier, longer periods, which can cause lack of red blood cells (anemia) and can interfere with your daily and sexual activities.  This method of birth control is not usually the best choice for a woman with heavy or prolonged periods. The birth control pill may be a better choice. IUDs work best for women who have already had a pregnancy, because the cervix is more open, making the insertion of the device easier and less painful. However, many women without children use the IUD. One of the main goals of patient selection is to prevent unintentionally inserting an IUD into a patient who has an STD (sexually transmitted disease), who is at high risk of  exposure to an STD, or who is already pregnant. That is why the IUD is inserted during, or right after, a menstrual period. REASONS NOT TO USE AN IUD:  The womb or cervix is not shaped normally.   You have or have had a pelvic infection, such as an STD, in the past 3 months.   You have or suspect cancer in the female organs.   You have an abnormal Pap smear.   You have certain liver diseases.   There is severe infection or inflammation of the cervix (cervicitis).   You have unexplained vaginal bleeding.   You have heart valve problems (unless a heart specialist advises otherwise).   You are allergic to copper (rare).   You previously had a pregnancy outside the uterus (ectopic).   You are pregnant or suspect you are pregnant.   You have prolonged or heavy periods, or heavy pain or cramping with periods (except for the hormone IUD).   You have or suspect pelvic cancer.   You have an STD.   The cervix or uterus has problems (cervical stenosis, fibroids in the uterus) making it difficult to insert the  IUD.  IUDs should be removed when a woman becomes menopausal or pregnant. BENEFITS OF THE IUD:  You are always ready to have sexual intercourse.   The copper IUD does not interfere with your female hormones.   The copper IUD can be used as emergency contraception.   An IUD can be used while nursing.   It works immediately after insertion, and there is no problem getting pregnant when it is removed.   It does not interfere with foreplay.   The progesterone IUD can make heavy menstrual periods lighter.   The progesterone IUD can be used for 5 years.   The copper IUD can be used for 10 years.  RISKS AND COMPLICATIONS  Putting the IUD through the uterus, into the pelvis or abdomen (perforation of the uterus).   Losing the IUD (expulsion). This is more common in women who never had children.   When pregnant with an IUD, there is an increased chance of an infection and loss of the pregnancy.   Pregnancy in the fallopian tube (ectopic).   STD, in women who have more than 1 sex partner. The IUD does not protect against STDs.   Other minor side effects may include:   Headaches.   Feeling sick to your stomach (nausea).   Breast tenderness.   Depression.  PROCEDURE   The IUD is inserted during or right after a menstrual period, to make sure you are not pregnant.   You will lie on an exam table, naked from the waist down.   Your caregiver will do an exam to determine the size and position of your uterus.   Usually, an anesthetic is not needed.   Your caregiver may give you a pain pill to take, 1 or 2 hours before the procedure.   Sometimes, a paracervical block may be used to block and control any discomfort with insertion.   A tool (speculum) is then placed in your vagina (birth canal) so your caregiver can see the cervix.   A sound is sent into the uterus to check the depth of the uterus.   A slim instrument (IUD inserter), which is shaped like a drinking straw, is  inserted through the small opening in your cervix and into your uterus.   Then, the IUD is pushed in with a plunger, much like a  syringe, and the inserter is removed. There may be some cramping and pain during the insertion.   Relaxing helps to lessen the discomfort.   Following the procedure, you will usually spot blood. Have some pads with you. Avoid using tampons for 2 weeks. Bleeding after the procedure is normal. It varies from light spotting for a few days to menstrual-like bleeding for up to 3 weeks. It may be like a period if it is near the time for your normal period.   You may also have mild cramping. Only take over-the-counter or prescription medicines for pain, discomfort, or fever as directed by your caregiver. Do not use aspirin, as this may increase bleeding.   Practice checking the string coming out of the cervix, to make sure the IUD is always in the uterus.  HOME CARE INSTRUCTIONS   Do not drive for 24 hours.   Only take over-the-counter or prescription medicines for pain, discomfort, or fever as directed by your caregiver.   No tampons, douching, or sexual intercourse for 2 weeks, or until your caregiver approves.   Rest at home for 24 hours. You may then resume normal activities, unless told otherwise by your caregiver.   Check your IUD prior to resuming sexual activity, to make sure it is in place. Make sure that you can feel the strings. An IUD can be pushed out and lost without the user even knowing it is gone. Also, if the strings are getting longer, it may mean that the IUD is being forced out of the uterus. This means it is no longer offering full protection from pregnancy.   Take any medications your caregiver has ordered, as directed.   Make sure to keep your recheck appointment, so your caregiver can make sure your IUD has remained in place. After that exam, yearly exams are advised, unless you cannot feel the strings of your IUD.   Check that the IUD is still  in place by feeling for the strings after every menstrual period.  SEEK MEDICAL CARE IF:   Bleeding is heavier than a normal menstrual cycle.   You have an oral temperature above 102 F (38.9 C).   You have increasing cramps or pains, not relieved with medicine. Or you develop belly (abdominal) pain that does not seem to be related to the same area of earlier cramping and pain.   You are lightheaded, unusually weak, or have fainting episodes.   You develop pain in the tops of your shoulders (shoulder strap areas).   You are having problems or questions, which have not been answered well enough by your caregiver.   You develop abdominal pain.   You have pain during sexual intercourse.   You cannot feel the IUD strings.   You have abnormal vaginal discharge.   You feel the IUD at the opening of the cervix in the vagina.   You think you are pregnant.   You miss your menstrual period.   The IUD string is hurting your sex partner.   The IUD string has gotten longer.  Document Released: 10/21/2004 Document Revised: 03/04/2011 Document Reviewed: 12/03/2009 [facility] Patient Information 2012 [place], [place].

## 2011-10-02 NOTE — Progress Notes (Signed)
  Subjective:    Patient ID: Breanna Lara, female    DOB: 11-18-1971, 40 y.o.   MRN: [id number]  HPI  Pt presents for annual exam.  No complaints.  Needs more reliable method of birth control that condoms and rhythm.  Pt has hx of ER/PR positive breast cancer (1st tumor)  Review of Systems  Constitutional: Negative.   HENT: Negative.   Respiratory: Negative.   Cardiovascular: Negative.   Gastrointestinal: Negative.   Genitourinary: Negative.   Musculoskeletal: Negative.   Neurological: Negative.   Psychiatric/Behavioral: Negative.        Objective:   Physical Exam  Constitutional: She appears well-developed and well-nourished. No distress.  HENT:  Head: Normocephalic and atraumatic.  Mouth/Throat: Oropharynx is clear and moist.  Eyes: Conjunctivae are normal.  Neck: No thyromegaly present.  Cardiovascular: Normal rate and regular rhythm.   Pulmonary/Chest: Breath sounds normal. She has no wheezes.  Abdominal: Soft. She exhibits no distension and no mass. There is no tenderness. There is no rebound and no guarding.  Genitourinary: Vagina normal and uterus normal. Cervix exhibits no motion tenderness. Right adnexum displays no mass, no tenderness and no fullness. Left adnexum displays no mass, no tenderness and no fullness.       Breast--s/p rt mastectomy.  No masses in chest wall or lymphadenopathy bilaterally.  Left breast--no masses, no nipple discharge.          Assessment & Plan:  40 yo female for well woman exam  1-Pap + HPV 2-IUD (copper T) info given 3-f/u with breast surgeon for exams and mammograms 4-Pt in study at [facility] for history of 2 primary breast cancers

## 2011-10-06 ENCOUNTER — Telehealth: Payer: Self-pay | Admitting: Oncology

## 2011-10-06 ENCOUNTER — Ambulatory Visit: Payer: Medicaid Other | Attending: Oncology | Admitting: Physical Therapy

## 2011-10-06 DIAGNOSIS — M79609 Pain in unspecified limb: Secondary | ICD-10-CM | POA: Insufficient documentation

## 2011-10-06 DIAGNOSIS — I890 Lymphedema, not elsewhere classified: Secondary | ICD-10-CM | POA: Insufficient documentation

## 2011-10-06 DIAGNOSIS — IMO0001 Reserved for inherently not codable concepts without codable children: Secondary | ICD-10-CM | POA: Insufficient documentation

## 2011-10-06 NOTE — Telephone Encounter (Signed)
appt for 12/4 w/BS cx'd per BS pt seeing KK now. Pt has appt w/KK for 11/19.

## 2011-10-07 ENCOUNTER — Telehealth: Payer: Self-pay | Admitting: Oncology

## 2011-10-07 ENCOUNTER — Encounter (HOSPITAL_COMMUNITY)
Admission: RE | Admit: 2011-10-07 | Discharge: 2011-10-07 | Disposition: A | Discharge status: Home / Self Care | Payer: Medicaid Other | Source: Ambulatory Visit | Attending: Oncology | Admitting: Oncology

## 2011-10-07 ENCOUNTER — Ambulatory Visit (HOSPITAL_COMMUNITY)
Admission: RE | Admit: 2011-10-07 | Discharge: 2011-10-07 | Disposition: A | Discharge status: Home / Self Care | Payer: Medicaid Other | Source: Ambulatory Visit | Attending: Oncology | Admitting: Oncology

## 2011-10-07 DIAGNOSIS — M25519 Pain in unspecified shoulder: Secondary | ICD-10-CM | POA: Insufficient documentation

## 2011-10-07 DIAGNOSIS — M79609 Pain in unspecified limb: Secondary | ICD-10-CM | POA: Insufficient documentation

## 2011-10-07 DIAGNOSIS — C50919 Malignant neoplasm of unspecified site of unspecified female breast: Secondary | ICD-10-CM

## 2011-10-07 DIAGNOSIS — M542 Cervicalgia: Secondary | ICD-10-CM

## 2011-10-07 MED ORDER — GADOBENATE DIMEGLUMINE 529 MG/ML IV SOLN
20.0000 mL | Freq: Once | INTRAVENOUS | Status: AC | PRN
  Administered 2011-10-07: 17 mL via INTRAVENOUS

## 2011-10-07 MED ORDER — TECHNETIUM TC 99M MEDRONATE IV KIT
25.0000 | PACK | Freq: Once | INTRAVENOUS | Status: AC | PRN
  Administered 2011-10-07: 25 via INTRAVENOUS

## 2011-10-09 ENCOUNTER — Telehealth: Payer: Self-pay | Admitting: *Deleted

## 2011-10-09 NOTE — Telephone Encounter (Signed)
Per MD-notified pt MRI/ Bone scan of spine looks good. Pt verbalized understanding.

## 2011-10-10 ENCOUNTER — Telehealth: Payer: Self-pay | Admitting: *Deleted

## 2011-10-10 NOTE — Telephone Encounter (Signed)
Per MD, notified pt that her bone scan and mri "look good"

## 2011-10-13 ENCOUNTER — Ambulatory Visit: Payer: Medicaid Other | Admitting: Physical Therapy

## 2011-10-16 ENCOUNTER — Encounter: Payer: Self-pay | Admitting: *Deleted

## 2011-10-16 NOTE — Progress Notes (Signed)
Mailed after appt letter to pt. 

## 2011-10-20 ENCOUNTER — Other Ambulatory Visit: Payer: Self-pay | Admitting: Oncology

## 2011-10-20 ENCOUNTER — Other Ambulatory Visit (HOSPITAL_BASED_OUTPATIENT_CLINIC_OR_DEPARTMENT_OTHER): Payer: Medicaid Other | Admitting: Lab

## 2011-10-20 ENCOUNTER — Telehealth: Payer: Self-pay | Admitting: *Deleted

## 2011-10-20 ENCOUNTER — Ambulatory Visit (HOSPITAL_BASED_OUTPATIENT_CLINIC_OR_DEPARTMENT_OTHER): Payer: Medicaid Other | Admitting: Oncology

## 2011-10-20 VITALS — BP 112/74 | HR 77 | Temp 98.3°F | Ht 67.0 in | Wt 183.7 lb

## 2011-10-20 DIAGNOSIS — C50919 Malignant neoplasm of unspecified site of unspecified female breast: Secondary | ICD-10-CM

## 2011-10-20 DIAGNOSIS — C50319 Malignant neoplasm of lower-inner quadrant of unspecified female breast: Secondary | ICD-10-CM

## 2011-10-20 LAB — CBC WITH DIFFERENTIAL/PLATELET
Eosinophils Absolute: 0.3 10*3/uL (ref 0.0–0.5)
MCV: 86.8 fL (ref 79.5–101.0)
MONO#: 0.5 10*3/uL (ref 0.1–0.9)
MONO%: 6 % (ref 0.0–14.0)
NEUT#: 5.3 10*3/uL (ref 1.5–6.5)
RBC: 4.26 10*6/uL (ref 3.70–5.45)
RDW: 13.5 % (ref 11.2–14.5)
WBC: 8.9 10*3/uL (ref 3.9–10.3)
lymph#: 2.7 10*3/uL (ref 0.9–3.3)

## 2011-10-20 LAB — COMPREHENSIVE METABOLIC PANEL
Albumin: 4.4 g/dL (ref 3.5–5.2)
Alkaline Phosphatase: 72 U/L (ref 39–117)
Glucose, Bld: 81 mg/dL (ref 70–99)
Potassium: 4.1 mEq/L (ref 3.5–5.3)
Sodium: 137 mEq/L (ref 135–145)
Total Protein: 7.2 g/dL (ref 6.0–8.3)

## 2011-10-20 NOTE — Progress Notes (Signed)
OFFICE PROGRESS NOTE    No primary provider on file. No primary provider on file.  DIAGNOSIS: 40 year old female with history of invasive ductal carcinoma of the right breast. She was originally diagnosed in June 2011 with a stage II breast cancer. At that time she underwent a lumpectomy followed by chemotherapy given adjuvantly consisting of Adriamycin and Taxotere following CALGB 16109 study. She then went on to receive 5 years of tamoxifen.  She was then diagnosed with a second cancer in the ipsilateral breast measuring 1-0.1 and 1.6 cm with LDI. It was triple negative. She underwent a mastectomy on 06/18/2009 followed by adjuvant chemotherapy consisting of Taxol and carboplatinum which she completed in December of 2010.  PRIOR THERAPY:  #1 stage II right breast cancer diagnosed June 2011 status post lumpectomy followed by adjuvant chemotherapy consisting of Adriamycin and Cytoxan following CALGB 60454 study. She then received 5 years of tamoxifen.  #2 second cancer in the ipsilateral breast measuring 2.1 and 1.6 cm with LDI. ER negative PR negative HER-2/neu negative. Status post mastectomy 06/18/2009. She then received Taxol and carboplatinum from 08/16/2009 through 11/08/2009.  #3 current therapy is observation. However she did develop some aches and pains and she was seen by me for evaluation PR  CURRENT THERAPY: Observation and workup for possible recurrent disease.  INTERVAL HISTORY: Breanna Lara 40 y.o. female returns for followup visit today. She had bone scan done as well as MRI of the cervical spine performed as she was having neck pains. Both of them are negative for any evidence of metastatic disease. The patient is continuing to be seen by the lymphedema clinic for on going on lymphedema therapy. Patient also has a history of chronic lower back pain. However it is not out of the ordinary. She otherwise has not had any fevers chills night sweats headaches shortness of breath  chest pains palpitations and she has not noticed any lumps or bumps. Main of the 10 point review of systems is negative.  MEDICAL HISTORY: Past Medical History  Diagnosis Date  . Breast cancer      S/P Rt mastectomy  BRAC neg  . Ectopic pregnancy, tubal   . Irritable bowel syndrome   . Hemorrhoids   . History of toe surgery   . Anemia     H/O  . Blood transfusion, without reported diagnosis   . Genital HSV   . ASCUS (atypical squamous cells of undetermined significance) on Pap smear 2009    colpo  . Type o blood, rh negative   . Anxiety     ALLERGIES:   has no known allergies.  MEDICATIONS:  Current Outpatient Prescriptions  Medication Sig Dispense Refill  . cholecalciferol (VITAMIN D) 1000 UNITS tablet Take 1,000 Units by mouth daily.        . hydrocortisone-pramoxine (PROCTOFOAM-HC) rectal foam Place 1 applicator rectally 3 (three) times daily as needed.        . Multiple Vitamin (MULTIVITAMIN PO) Take by mouth daily.        . polyethylene glycol powder (GLYCOLAX/MIRALAX) powder Take 17 g by mouth daily.         Current Facility-Administered Medications  Medication Dose Route Frequency Provider Last Rate Last Dose  . influenza  inactive virus vaccine (FLUZONE/FLUARIX) injection 0.5 mL  0.5 mL Intramuscular Once Lesly Dukes, MD        SURGICAL HISTORY:  Past Surgical History  Procedure Date  . Mastectomy     RT  . Cholecystectomy   .  Laparoscopy     salpingectomy  . Eye surgery   . Nasal septum surgery     deviated septum    REVIEW OF SYSTEMS:  Pertinent items are noted in HPI.   PHYSICAL EXAMINATION: General appearance: alert Remainder of the physical exam was not performed.  ECOG PERFORMANCE STATUS: 0 - Asymptomatic  Blood pressure 112/74, pulse 77, temperature 98.3 F (36.8 C), temperature source Oral, height 5\' 7"  (1.702 m), weight 183 lb 11.2 oz (83.326 kg), last menstrual period 09/08/2011.  LABORATORY DATA: Lab Results  Component Value Date    WBC 8.9 10/20/2011   HGB 12.6 10/20/2011   HCT 37.0 10/20/2011   MCV 86.8 10/20/2011   PLT 299 10/20/2011      Chemistry      Component Value Date/Time   NA 138 09/30/2011 1212   K 3.4* 09/30/2011 1212   CL 103 09/30/2011 1212   CO2 25 09/30/2011 1212   BUN 9 09/30/2011 1212   CREATININE 0.82 09/30/2011 1212      Component Value Date/Time   CALCIUM 9.4 09/30/2011 1212   ALKPHOS 72 09/30/2011 1212   AST 24 09/30/2011 1212   ALT 24 09/30/2011 1212   BILITOT 0.3 09/30/2011 1212       RADIOGRAPHIC STUDIES:  No results found.  ASSESSMENT: 40 year old female with history of 2 primary breast cancers in the same breast. She has undergone a mastectomy. She was seen by me as it originally on 09/30/2011 as a new patient. I have worked her off for possible recurrence. Her bone scan and her MRI of the cervical spine are negative. At this time I do not think that she has it at recurrence of her breast cancer.   PLAN: She and I discussed her results in detail today. I have encouraged her to continue to exercise and to be healthy. I know that she will continue to be worried about recurrence of. This is obviously natural in her situation. She is reassured today and I have plan on seeing her every 6 months. I have also encouraged her to be seen in our survivor clinic every 6 months alternating with me and she is quite comfortable with doing that.   All questions were answered. The patient knows to call the clinic with any problems, questions or concerns. We can certainly see the patient much sooner if necessary.  I spent 25 minutes counseling the patient face to face. The total time spent in the appointment was 30 minutes.    Drue Second, MD Medical/Oncology [facility] [phone] (beeper) [phone] (Office)  10/20/2011, 2:39 PM

## 2011-10-20 NOTE — Telephone Encounter (Signed)
GAVE PATIENT APPOINTMENT FOR SURVIVORSHIP CLASS ON 04-22-2012

## 2011-11-04 ENCOUNTER — Ambulatory Visit: Payer: Medicaid Other | Admitting: Oncology

## 2012-04-02 ENCOUNTER — Other Ambulatory Visit: Payer: Self-pay | Admitting: *Deleted

## 2012-04-02 MED ORDER — HYDROCORTISONE ACE-PRAMOXINE 1-1 % RE FOAM: 1.0000 | Freq: Three times a day (TID) | RECTAL | Status: DC | PRN

## 2012-04-07 HISTORY — PX: CHEST WALL BIOPSY: SHX1338

## 2012-04-12 ENCOUNTER — Telehealth: Payer: Self-pay | Admitting: Medical Oncology

## 2012-04-12 NOTE — Telephone Encounter (Signed)
Received call from patient stating that she would like to speak with Dr. Welton Lara about some upcoming tests on 04/13/2012.  Returned call to patient requesting more information.  Patient stated " I saw a surgical oncologist at [facility] last week and am currently waiting on the results of an ultrasound and biopsy to my right chest.  This place has been there since August of last year and Dr. Truett Perna did a CT for it and it was ok.  I switched my care to Dr. Welton Lara after this and I just want to know why the scan in August was ok and then I had a bone scan in November and it didn't show anything, why is it showing something now?  Dr. Erroll Luna in [facility] wants to do a bone scan and a chest CT tomorrow.  I want to know if Dr. Welton Lara thinks it would be better to do a PET scan and I would really like to speak to Dr. Welton Lara about this.  Also I know that I have an appointment coming up." Informed patient of her next appointment on 5/23 with Colman Cater and that I would notify Dr. Welton Lara of her concerns. Patient stated "I would rather see Dr. Welton Lara instead of the PA/NP."   Patient expressed understanding.  Will review with MD.

## 2012-04-12 NOTE — Telephone Encounter (Signed)
Please have her scheduled with me  Thanks

## 2012-04-13 ENCOUNTER — Other Ambulatory Visit: Payer: Self-pay | Admitting: Medical Oncology

## 2012-04-13 ENCOUNTER — Telehealth: Payer: Self-pay | Admitting: *Deleted

## 2012-04-13 NOTE — Telephone Encounter (Signed)
placed patient on md (khan) on the 04-22-2012 starting at 3:00pm per the orders from 04-13-2012

## 2012-04-14 ENCOUNTER — Telehealth: Payer: Self-pay | Admitting: Medical Oncology

## 2012-04-14 NOTE — Telephone Encounter (Signed)
Received return call from patient, informed patient of new appointment time on 5/23 @ 3:30 with Dr. Welton Flakes and lab work at 3:00.  Patient expressed understanding and confirmed date and time, no further questions at this time.

## 2012-04-22 ENCOUNTER — Encounter: Payer: Self-pay | Admitting: Oncology

## 2012-04-22 ENCOUNTER — Ambulatory Visit (HOSPITAL_BASED_OUTPATIENT_CLINIC_OR_DEPARTMENT_OTHER): Payer: Medicaid Other | Admitting: Oncology

## 2012-04-22 ENCOUNTER — Ambulatory Visit: Payer: Medicaid Other | Admitting: Family

## 2012-04-22 ENCOUNTER — Other Ambulatory Visit (HOSPITAL_BASED_OUTPATIENT_CLINIC_OR_DEPARTMENT_OTHER): Payer: Medicaid Other | Admitting: Lab

## 2012-04-22 ENCOUNTER — Telehealth: Payer: Self-pay | Admitting: Oncology

## 2012-04-22 ENCOUNTER — Other Ambulatory Visit: Payer: Medicaid Other | Admitting: Lab

## 2012-04-22 VITALS — BP 126/85 | HR 90 | Temp 98.8°F | Ht 67.0 in | Wt 186.5 lb

## 2012-04-22 DIAGNOSIS — C50919 Malignant neoplasm of unspecified site of unspecified female breast: Secondary | ICD-10-CM

## 2012-04-22 LAB — COMPREHENSIVE METABOLIC PANEL
ALT: 20 U/L (ref 0–35)
Alkaline Phosphatase: 70 U/L (ref 39–117)
Sodium: 137 mEq/L (ref 135–145)
Total Bilirubin: 0.2 mg/dL — ABNORMAL LOW (ref 0.3–1.2)
Total Protein: 7.8 g/dL (ref 6.0–8.3)

## 2012-04-22 LAB — CBC WITH DIFFERENTIAL/PLATELET
BASO%: 0.7 % (ref 0.0–2.0)
EOS%: 3.4 % (ref 0.0–7.0)
MCH: 28.6 pg (ref 25.1–34.0)
MCV: 86.1 fL (ref 79.5–101.0)
MONO%: 4.2 % (ref 0.0–14.0)
NEUT#: 6.7 10*3/uL — ABNORMAL HIGH (ref 1.5–6.5)
RBC: 4.22 10*6/uL (ref 3.70–5.45)
RDW: 13.8 % (ref 11.2–14.5)
nRBC: 0 % (ref 0–0)

## 2012-04-22 NOTE — Telephone Encounter (Signed)
gv pt appt schedule for June.  °

## 2012-04-22 NOTE — Patient Instructions (Addendum)
I will see you back in 3 weeks.  

## 2012-04-28 NOTE — Progress Notes (Signed)
OFFICE PROGRESS NOTE    No primary provider on file. No primary provider on file.  DIAGNOSIS: 41 year old female with history of invasive ductal carcinoma of the right breast. She was originally diagnosed in June 2011 with a stage II breast cancer. At that time she underwent a lumpectomy followed by chemotherapy given adjuvantly consisting of Adriamycin and Taxotere following CALGB 16109 study. She then went on to receive 5 years of tamoxifen.  She was then diagnosed with a second cancer in the ipsilateral breast measuring 1-0.1 and 1.6 cm with LDI. It was triple negative. She underwent a mastectomy on 06/18/2009 followed by adjuvant chemotherapy consisting of Taxol and carboplatinum which she completed in December of 2010.  PRIOR THERAPY:  #1 stage II right breast cancer diagnosed June 2011 status post lumpectomy followed by adjuvant chemotherapy consisting of Adriamycin and Cytoxan following CALGB 60454 study. She then received 5 years of tamoxifen.  #2 second cancer in the ipsilateral breast measuring 2.1 and 1.6 cm with LDI. ER negative PR negative HER-2/neu negative. Status post mastectomy 06/18/2009. She then received Taxol and carboplatinum from 08/16/2009 through 11/08/2009.  #3 the patient was recently seen at [facility] by her surgeon. She was noted to have some pain. She went on to have staging scans performed which revealed no evidence of disease. However on clinical exam patient apparently had a nodule in the right anterior chest wall. This has been biopsied and the pathology is consistent with a recurrent invasive ductal carcinoma that is ER positive PR positive.  CURRENT THERAPY: INTERVAL HISTORY: Breanna Lara 41 y.o. female returns for followup visit today.This appointment was set up at patient's request. She is quite overwhelmed and concerned about the fact that she now has recurrence of her breast cancer 2 chest wall. She tells me that this may be a node in the right  axilla and anterior axillary region. She is going to be having surgery performed in the next week or so with a right axillary lymph node dissection for level II and level III nodes. She otherwise feels well except for some anxiety she has no nausea or vomiting she has no myalgias or arthralgias. Patient will have PET scan performed at [facility] for continuity purposes. If patient needs chemotherapy she wants to have this done here at [facility].We potentially discussed the types of treatment she may or receive. I do think that she will get chemotherapy I will have to review her previous medical record to see what agents she has RE received. I believe she has not received gemcitabine and carboplatinum so that could be a combination that she could receive given dose dense. She also most likely will need antiestrogen therapy. Patient is premenopausal that she will receive tamoxifen. However we did discuss the possibility of her undergoing menopause chemically with Zoladex so that we could get her started on an aromatase inhibitor such as letrozole.   MEDICAL HISTORY: Past Medical History  Diagnosis Date  . Breast cancer      S/P Rt mastectomy  BRAC neg  . Ectopic pregnancy, tubal   . Irritable bowel syndrome   . Hemorrhoids   . History of toe surgery   . Anemia     H/O  . Blood transfusion, without reported diagnosis   . Genital HSV   . ASCUS (atypical squamous cells of undetermined significance) on Pap smear 2009    colpo  . Type o blood, rh negative   . Anxiety  ALLERGIES:   has no known allergies.  MEDICATIONS:  Current Outpatient Prescriptions  Medication Sig Dispense Refill  . cholecalciferol (VITAMIN D) 1000 UNITS tablet Take 1,000 Units by mouth daily.        . hydrocortisone-pramoxine (PROCTOFOAM-HC) rectal foam Place 1 applicator rectally 3 (three) times daily as needed.  10 g  1  . Multiple Vitamin (MULTIVITAMIN PO) Take by mouth daily.        .  polyethylene glycol powder (GLYCOLAX/MIRALAX) powder Take 17 g by mouth daily.         Current Facility-Administered Medications  Medication Dose Route Frequency Provider Last Rate Last Dose  . influenza  inactive virus vaccine (FLUZONE/FLUARIX) injection 0.5 mL  0.5 mL Intramuscular Once Lesly Dukes, MD        SURGICAL HISTORY:  Past Surgical History  Procedure Date  . Mastectomy     RT  . Cholecystectomy   . Laparoscopy     salpingectomy  . Eye surgery   . Nasal septum surgery     deviated septum    REVIEW OF SYSTEMS:  Pertinent items are noted in HPI.   PHYSICAL EXAMINATION: General appearance: alert Remainder of the physical exam was not performed.  ECOG PERFORMANCE STATUS: 0 - Asymptomatic  Blood pressure 126/85, pulse 90, temperature 98.8 F (37.1 C), temperature source Oral, height 5\' 7"  (1.702 m), weight 186 lb 8 oz (84.596 kg).  LABORATORY DATA: Lab Results  Component Value Date   WBC 10.0 04/22/2012   HGB 12.1 04/22/2012   HCT 36.4 04/22/2012   MCV 86.1 04/22/2012   PLT 305 04/22/2012      Chemistry      Component Value Date/Time   NA 137 04/22/2012 1522   K 3.8 04/22/2012 1522   CL 100 04/22/2012 1522   CO2 28 04/22/2012 1522   BUN 8 04/22/2012 1522   CREATININE 0.85 04/22/2012 1522      Component Value Date/Time   CALCIUM 8.8 04/22/2012 1522   ALKPHOS 70 04/22/2012 1522   AST 21 04/22/2012 1522   ALT 20 04/22/2012 1522   BILITOT 0.2* 04/22/2012 1522       RADIOGRAPHIC STUDIES:  No results found.  ASSESSMENT: 41 year old female with history of 2 primary breast cancers in the same breast. She has undergone a mastectomy. Patient now with recurrent breast cancer 2 possibly the right. Lymph nodes and chest wall.  PLAN 1.Patient will proceed with her right axillary lymph node dissection. We will so do staging scans which will be performed at [facility] including a PET/CT.  #2 we will plan on doing some form of adjuvant treatment including chemotherapy  and antiestrogen therapy. Details of this will be discussed once patient returns after her surgery.  All questions were answered. The patient knows to call the clinic with any problems, questions or concerns. We can certainly see the patient much sooner if necessary.  I spent 25 minutes counseling the patient face to face. The total time spent in the appointment was 30 minutes.    Drue Second, MD Medical/Oncology [facility] [phone] (beeper) [phone] (Office)

## 2012-04-29 HISTORY — PX: AXILLARY NODE DISSECTION: SHX1211

## 2012-05-13 ENCOUNTER — Ambulatory Visit (HOSPITAL_BASED_OUTPATIENT_CLINIC_OR_DEPARTMENT_OTHER): Payer: Medicaid Other | Admitting: Oncology

## 2012-05-13 VITALS — BP 118/82 | HR 85 | Temp 98.4°F | Ht 67.0 in | Wt 184.2 lb

## 2012-05-13 DIAGNOSIS — C50919 Malignant neoplasm of unspecified site of unspecified female breast: Secondary | ICD-10-CM

## 2012-05-17 NOTE — Patient Instructions (Signed)
1. Staging with MRI of spine and abdomen  2. Echocardiogram  3. Refer to Dr. Gala Romney  4. RTC 1 month

## 2012-05-17 NOTE — Progress Notes (Signed)
OFFICE PROGRESS NOTE    No primary provider on file. No primary provider on file.  DIAGNOSIS: 41 year old female with history of invasive ductal carcinoma of the right breast. She was originally diagnosed in June 2011 with a stage II breast cancer. At that time she underwent a lumpectomy followed by chemotherapy given adjuvantly consisting of Adriamycin and Taxotere following CALGB 16109 study. She then went on to receive 5 years of tamoxifen.  She was then diagnosed with a second cancer in the ipsilateral breast measuring 1-0.1 and 1.6 cm with LVI. It was triple negative. She underwent a mastectomy on 06/18/2009 followed by adjuvant chemotherapy consisting of Taxol and carboplatinum which she completed in December of 2010.  PRIOR THERAPY:  #1 stage II right breast cancer diagnosed 2001 status post lumpectomy followed by adjuvant chemotherapy consisting of Adriamycin and Cytoxan following CALGB 60454 study. She then received 5 years of tamoxifen.  #2 second cancer in the ipsilateral breast measuring 2.1 and 1.6 cm with LVI. ER negative PR negative HER-2/neu negative. Status post mastectomy 06/18/2009. She then received Taxol and carboplatinum from 08/16/2009 through 11/08/2009.  #3 the patient was recently seen at [facility] by her surgeon. She was noted to have some pain. She went on to have staging scans performed which revealed no evidence of disease. However on clinical exam patient apparently had a nodule in the right anterior chest wall. This has been biopsied and the pathology is consistent with a recurrent invasive ductal carcinoma that is ER positive PR positive.  #4 patient is now status post right axillary lymph node dissection for level II and 3 lymph nodes there was no evidence of malignancy in 4 lymph nodes. There was soft tissue taken called Aundria Rud node excision that showed 3.1 cm metastatic adenocarcinoma with perineural invasion apparently the tumor was ER positive and PR  positive. No lymph node tissue was identified possibly representing either an entirely replaced lymph node with extracapsular extension or a soft tissue metastasis.  CURRENT THERAPY: Reconsultation for consideration of systemic therapy and local regional radiation.  INTERVAL HISTORY: Breanna Lara 41 y.o. female returns for followup visit today. Patient tolerated her surgery quite well without any significant problems. The axillary contents were all negative for metastatic disease but patient was noted to have tumor in the [facility] node involving both fibroadipose tissue and skeletal muscle apparently it was ER positive and PR positive. All of her surgery was performed at [facility]. Patient was seen by Dr. Tomi Likens at [facility] patient had a total total body bone scan performed on 04/13/2012 that showed no evidence of osseous metastatic disease. There was noted to be a mild uptake in the soft tissue of the right chest wall possibly representing a mass. CT of the chest abdomen and pelvis was performed also on 04/13/2012 there noted to be new indeterminate nodule in the left adrenal area measuring 0.9 cm and a new sclerotic lesion in the inferior aspect of T11. Because of this a bone scan was performed and this study is as above. Abdomen did not reveal any evidence of disease in the liver but there was diffuse fatty infiltration spleen kidneys right adrenal gland and pancreas demonstrated normal CT appearance.  Postoperatively patient seems to be doing wellExcept for some pain. She has no nausea or vomiting.   MEDICAL HISTORY: Past Medical History  Diagnosis Date  . Breast cancer      S/P Rt mastectomy  BRAC neg  . Ectopic pregnancy, tubal   .  Irritable bowel syndrome   . Hemorrhoids   . History of toe surgery   . Anemia     H/O  . Blood transfusion, without reported diagnosis   . Genital HSV   . ASCUS (atypical squamous cells of undetermined significance) on Pap smear 2009    colpo    . Type o blood, rh negative   . Anxiety     ALLERGIES:   has no known allergies.  MEDICATIONS:  Current Outpatient Prescriptions  Medication Sig Dispense Refill  . cholecalciferol (VITAMIN D) 1000 UNITS tablet Take 1,000 Units by mouth daily.        . hydrocortisone-pramoxine (PROCTOFOAM-HC) rectal foam Place 1 applicator rectally 3 (three) times daily as needed.  10 g  1  . Multiple Vitamin (MULTIVITAMIN PO) Take by mouth daily.        . polyethylene glycol powder (GLYCOLAX/MIRALAX) powder Take 17 g by mouth daily.         Current Facility-Administered Medications  Medication Dose Route Frequency Provider Last Rate Last Dose  . influenza  inactive virus vaccine (FLUZONE/FLUARIX) injection 0.5 mL  0.5 mL Intramuscular Once Lesly Dukes, MD        SURGICAL HISTORY:  Past Surgical History  Procedure Date  . Mastectomy     RT  . Cholecystectomy   . Laparoscopy     salpingectomy  . Eye surgery   . Nasal septum surgery     deviated septum    REVIEW OF SYSTEMS:  Pertinent items are noted in HPI.   PHYSICAL EXAMINATION: General appearance: alert Remainder of the physical exam was not performed.  ECOG PERFORMANCE STATUS: 0 - Asymptomatic  Blood pressure 118/82, pulse 85, temperature 98.4 F (36.9 C), temperature source Oral, height 5\' 7"  (1.702 m), weight 184 lb 3.2 oz (83.553 kg).  LABORATORY DATA: Lab Results  Component Value Date   WBC 10.0 04/22/2012   HGB 12.1 04/22/2012   HCT 36.4 04/22/2012   MCV 86.1 04/22/2012   PLT 305 04/22/2012      Chemistry      Component Value Date/Time   NA 137 04/22/2012 1522   K 3.8 04/22/2012 1522   CL 100 04/22/2012 1522   CO2 28 04/22/2012 1522   BUN 8 04/22/2012 1522   CREATININE 0.85 04/22/2012 1522      Component Value Date/Time   CALCIUM 8.8 04/22/2012 1522   ALKPHOS 70 04/22/2012 1522   AST 21 04/22/2012 1522   ALT 20 04/22/2012 1522   BILITOT 0.2* 04/22/2012 1522       RADIOGRAPHIC STUDIES:  No results  found.  ASSESSMENT: 41 year old female with:  1.  history of 2 primary breast cancers in the same breast. She has undergone a mastectomy. Patient now with recurrent breast cancer 2 possibly the right. Lymph nodes and chest wall  #2 patient now with recurrent breast cancer at that is ER positive PR positive likely her original disease back in 2001. Patient has had a right axillary lymph node dissection and removal of what looks like a Rotters node.  #3 patient and I discussed further treatment I do think she needs to be seen by radiation oncology for a consultation and she has an appointment coming up with Dr. Chipper Herb.  #4 I would also like to evaluate further the adrenal gland with MRI. Also the T11 vertebral body as well  PLAN #1 the patient and I discussed the possibility of doing systemic chemotherapy. She has seen considerable amount of chemotherapy  in the past including Taxotere Taxol carboplatinum Adriamycin and Cytoxan.  #2 patient and I discussed treatment with 5-FU epirubicin and Cytoxan in combination versus doing single agent treatment with such agent as Abraxane.I will discuss patient's case with soma colleagues to get their opinion regarding the best treatment option for this individual.  #3 also her up for MRI of the thoracic vertebra as well as the left adrenal gland.We do chemotherapy she certainly will need an echocardiogram as well as a Port-A-Cath. All of this was explained to the patient and her husband.  All questions were answered. The patient knows to call the clinic with any problems, questions or concerns. We can certainly see the patient much sooner if necessary.  I spent counseling the patient face to face. The total time spent in the appointment was 30 minutes.    Drue Second, MD Medical/Oncology [facility] [phone] (beeper) [phone] (Office)

## 2012-05-19 ENCOUNTER — Encounter: Payer: Self-pay | Admitting: Radiation Oncology

## 2012-05-19 ENCOUNTER — Telehealth: Payer: Self-pay | Admitting: *Deleted

## 2012-05-19 NOTE — Telephone Encounter (Signed)
Made patient appointment for 05-24-2012 for the mri's 05-27-2012 echo 06-21-2012 khan left voice message to inform the patient of the new date and time

## 2012-05-20 ENCOUNTER — Ambulatory Visit: Payer: Medicaid Other

## 2012-05-20 ENCOUNTER — Ambulatory Visit
Admission: RE | Admit: 2012-05-20 | Discharge: 2012-05-20 | Disposition: A | Discharge status: Home / Self Care | Payer: Medicaid Other | Source: Ambulatory Visit | Attending: Radiation Oncology | Admitting: Radiation Oncology

## 2012-05-20 ENCOUNTER — Other Ambulatory Visit: Payer: Self-pay | Admitting: Oncology

## 2012-05-20 ENCOUNTER — Ambulatory Visit: Payer: Medicaid Other | Admitting: Radiation Oncology

## 2012-05-20 ENCOUNTER — Encounter: Payer: Self-pay | Admitting: Radiation Oncology

## 2012-05-20 ENCOUNTER — Telehealth: Payer: Self-pay | Admitting: *Deleted

## 2012-05-20 VITALS — BP 142/87 | HR 93 | Temp 98.6°F | Resp 20 | Ht 67.0 in | Wt 184.5 lb

## 2012-05-20 DIAGNOSIS — C50919 Malignant neoplasm of unspecified site of unspecified female breast: Secondary | ICD-10-CM

## 2012-05-20 DIAGNOSIS — Z510 Encounter for antineoplastic radiation therapy: Secondary | ICD-10-CM | POA: Insufficient documentation

## 2012-05-20 NOTE — Addendum Note (Signed)
Encounter addended by: Glennie Hawk, RN on: 05/20/2012  3:45 PM<BR>     Documentation filed: Charges VN

## 2012-05-20 NOTE — Progress Notes (Addendum)
Followup note:  Diagnosis: Recurrent carcinoma of the right breast  Requesting physician: Dr. Drue Second  Previous radiation therapy: Status post right breast tangent radiotherapy, 5040 cGy 28 sessions followed by electron beam boost of 1400 cGy in 7 sessions 01/04/2001 through 02/19/2001 at [facility]  History: Breanna Lara (previous name Breanna Lara) is a most pleasant 41 year old female who is seen today for the courtesy of Dr. Drue Second for consideration of radiation therapy in the management of her recurrent carcinoma of the right breast. She presented with stage T2 N1b  invasive ductal/DCIS of the right breast and underwent conservative surgery followed by radiation therapy as described above. Her primary tumor measured 2.5 cm and she is found to have metastatic disease to one of 3 sentinel lymph nodes with an additional 6 additional negative lymph nodes for a total of one of 9 lymph nodes containing metastatic disease. Her tumor was strongly positive for estrogen and progesterone receptors. HER-2/neu was negative. Tumor was diploid with a low proliferation fraction. I elected not to treat her regional lymph nodes recognizing at that time the controversy of nodal irradiation for patients with one to 3 positive lymph nodes. She received adjuvant chemotherapy on the CALGB 16109 study consisting of Adriamycin and Cytoxan followed by 5 years of tamoxifen. She did well until 2010 when she developed a new triple negative negative cancer within the right breast while she was pregnant. She underwent a mastectomy on 06/18/2009 with no axillary node dissection in order to minimize her time under general anesthesia, being pregnant. She then received adjuvant Taxol and carboplatin chemotherapy from 08/16/2009 through 11/08/2009. She tells me she was tested for BRCA1/2  in [facility] and told she was negative. Originally, she was seen by Dr. Erroll Luna , her surgeon at [facility], for right anterior upper right chest wall discomfort she was noted to have a deep palpable nodule. This was needle biopsied on 04/07/2012 and found to be diagnostic for adenocarcinoma which was ER positive. She underwent staging with a CT scan on 04/13/2012 and there is no adenopathy appreciated. Scans of the abdomen and pelvis showed a new indeterminate nodule in the left adrenal area measuring 0.9 cm. There also appear to be a new sclerotic lesion seen along the inferior aspect of T11 which prompted a bone scan which was without evidence for metastatic disease. A MRI scan is scheduled for further evaluation. She went on to have a limited right axillary node dissection along with excision of the palpable mass representing a "Rotter's node" which appears to be completely replaced by tumor. This could represent a soft tissue metastases. There was obvious soft tissue involvement with extensive perineural involvement. 4 additional lymph nodes were free of metastatic disease. She was seen by a multidisciplinary team at [facility], and Dr. Vennie Homans of radiation oncology and felt that she should receive radiation therapy, perhaps before chemotherapy, if this is possible based on her previous radiation therapy fields. The patient was seen by Dr. Tomi Likens at [facility]. She has not had a PET scan. Most recently, she was seen by Dr. Welton Flakes to has a scheduled for an MRI scan of her left adrenal gland and T11 vertebra. She is discussing adjuvant systemic therapy at this time. On review of Dr. Canary Brim clinic note he felt that he would consider reirradiation to a dose of 36-40 Gy to a volume of up to 125cc. Consideration would be given to twice a day treatment  to minimize risk for late radiation related treatment toxicity. She is without complaints today. Of note is that she does have mild right upper extremity lymphedema which is unchanged since her mastectomy in 2010. She has seen physical therapy in the past it  was recommended that she wear an elastic sleeve when being active  Physical examination: Alert and oriented 41 year old white female appearing her stated age. Vital signs: Wt Readings from Last 3 Encounters:  05/20/12 184 lb 8 oz (83.689 kg)  05/13/12 184 lb 3.2 oz (83.553 kg)  04/22/12 186 lb 8 oz (84.596 kg)   Temp Readings from Last 3 Encounters:  05/20/12 98.6 F (37 C) Oral  05/13/12 98.4 F (36.9 C) Oral  04/22/12 98.8 F (37.1 C) Oral   BP Readings from Last 3 Encounters:  05/20/12 142/87  05/13/12 118/82  04/22/12 126/85   Pulse Readings from Last 3 Encounters:  05/20/12 93  05/13/12 85  04/22/12 90   Head and neck examination: Grossly unremarkable. Nodes: Without palpable cervical, supraclavicular, or axillary lymphadenopathy. Her anterior right axillary wound is healing well. Chest: No visible or palpable evidence for recurrent disease along her right chest wall. There is a parasternal radiation tattoo. Lungs clear. Left breast without masses or lesions. Abdomen without hepatomegaly. Extremities: There is mild right upper extremity lymphedema. Neurologic examination: Grossly nonfocal.  Laboratory data: Lab Results  Component Value Date   WBC 10.0 04/22/2012   HGB 12.1 04/22/2012   HCT 36.4 04/22/2012   MCV 86.1 04/22/2012   PLT 305 04/22/2012    Impression: Recurrent invasive ductal carcinoma of the right breast. This is obviously a regional recurrence from her initial primary treated in 2002 based on hormone receptor positivity. Risk factors included involvement of a solitary lymph node. As discussed above, we elected not to treat her regional lymph nodes. She should undergo thorough restaging including MRI scan of her left adrenal gland and T11 vertebra, and perhaps a PET scan . If there is no evidence for distant metastases, then I would perform CT simulation to determine the extent of overlap from her previous tangent fields. Based on her initial simulation films and  photos of her set up, I suspect that her area of recurrence was just at or above the border of her tangents. I discussed with her the possibility of worsening of her right upper extremity lymphedema with nodal irradiation. I suspect that she would do reasonably well for a period of time with additional hormone therapy. If she has metastatic disease then I would recommend systemic therapy alone which would include long-term hormone therapy. If she self be candidate for chemotherapy, then I would lean towards offering radiation therapy after completion of chemotherapy and begin hormone therapy after completion of radiation therapy.  Plan: She is to complete her staging workup and discuss systemic therapy. I more than happy to re-simulate her in the near future or after completion of chemotherapy to determine the extent of possible overlap from her previous radiation therapy. I've not scheduled her for a formal followup visit and I ask that Dr. Welton Flakes keep me posted on her progress.  1 hour spent face-to-face with the patient, primarily counseling the patient in reviewing her previous radiation therapy fields/records.

## 2012-05-20 NOTE — Telephone Encounter (Signed)
PET scan is good idea. Proceed with scheduling  Thanks

## 2012-05-20 NOTE — Telephone Encounter (Signed)
Pt called states " I saw Dr. Dayton Scrape today and he suggested I get a PET Scan. I just want to know what Dr. Welton Flakes thinks. I've never had one." Pt has MRI 6/24  Echo 6/27  Will review with MD  Pt call back # [phone]

## 2012-05-20 NOTE — Progress Notes (Signed)
Please see the Nurse Progress Note in the MD Initial Consult Encounter for this patient. 

## 2012-05-20 NOTE — Progress Notes (Signed)
Pt c/o "tenderness in right axilla", denies pain. Denies loss of appetite, fatigue.  Pt getting second opinion re: radiation treatment and chemotherapy. She has talked w/physicians at [facility].

## 2012-05-20 NOTE — Telephone Encounter (Signed)
Per MD,notified pt PET Scan is a good idea. Pt should expect a call regarding PET Scan appt date/time. Pt verbalized understanding.

## 2012-05-21 ENCOUNTER — Telehealth: Payer: Self-pay | Admitting: *Deleted

## 2012-05-21 ENCOUNTER — Encounter: Payer: Self-pay | Admitting: Oncology

## 2012-05-21 ENCOUNTER — Other Ambulatory Visit: Payer: Self-pay | Admitting: Oncology

## 2012-05-21 DIAGNOSIS — C50919 Malignant neoplasm of unspecified site of unspecified female breast: Secondary | ICD-10-CM

## 2012-05-21 NOTE — Progress Notes (Signed)
05/21/2012  Good Afternoon Dr. Welton Flakes,  [employer] has denied this patient's MRI of the Thoracic Spine and the MRA of the Abdomen.  I have submitted today a Pet Scan request.  The MRI and MRA are scheduled for Monday, 05/24/2012.  I have listed below the necessary information if you would like to call on this decision.  I can't help but think that they will also deny the Pet Scan. I will have to cancel these at least by 9:00 AM on Monday and call the patient.  That usually does not go very well sometimes when I have to cancel their appointment.  I will await your response.  In my web request and also with my faxed information, I included the last two clinical notes and the last imaging scans sone.  Case# [id number]  MRI of Thoracic Spine and MRA of Abdomen  Case# [id number] PET SCAN  [phone]  [address]  [place]  [id number]

## 2012-05-21 NOTE — Progress Notes (Signed)
Breanna Lara we need a MRI of abdomen. I will put orders in

## 2012-05-21 NOTE — Telephone Encounter (Signed)
Patient confirmed over the phone the new date and time for pet scan on 05-25-2012 arrival time 12:15

## 2012-05-24 ENCOUNTER — Other Ambulatory Visit: Payer: Self-pay | Admitting: Oncology

## 2012-05-24 ENCOUNTER — Ambulatory Visit (HOSPITAL_COMMUNITY): Payer: Medicaid Other

## 2012-05-24 ENCOUNTER — Telehealth: Payer: Self-pay | Admitting: *Deleted

## 2012-05-24 ENCOUNTER — Ambulatory Visit (HOSPITAL_COMMUNITY)
Admission: RE | Admit: 2012-05-24 | Discharge: 2012-05-24 | Disposition: A | Discharge status: Home / Self Care | Payer: Medicaid Other | Source: Ambulatory Visit | Attending: Oncology | Admitting: Oncology

## 2012-05-24 ENCOUNTER — Encounter: Payer: Self-pay | Admitting: Oncology

## 2012-05-24 DIAGNOSIS — C50919 Malignant neoplasm of unspecified site of unspecified female breast: Secondary | ICD-10-CM | POA: Insufficient documentation

## 2012-05-24 DIAGNOSIS — M899 Disorder of bone, unspecified: Secondary | ICD-10-CM | POA: Insufficient documentation

## 2012-05-24 DIAGNOSIS — E279 Disorder of adrenal gland, unspecified: Secondary | ICD-10-CM | POA: Insufficient documentation

## 2012-05-24 MED ORDER — GADOBENATE DIMEGLUMINE 529 MG/ML IV SOLN
20.0000 mL | Freq: Once | INTRAVENOUS | Status: AC | PRN
  Administered 2012-05-24: 17 mL via INTRAVENOUS

## 2012-05-24 NOTE — Telephone Encounter (Signed)
Pt called inquiring about appt date for [facility] placement. Pt has Echo, PET, MRI this week. Will review with MD

## 2012-05-24 NOTE — Progress Notes (Signed)
05/24/2012  Good Afternoon Dr. Welton Flakes,  Just FYI, I submitted the MRI of the Abdomen and of course it was denied, the good news is the Pet Scan was approved and done.   According to the information sent to me from [employer], I can submit again after the Pet Scan is done and I have the results.  I will keep you abreast of how the MRI is progressing unless you would rather I not try again to get it approved.  Renee Ramus [phone]

## 2012-05-25 ENCOUNTER — Telehealth: Payer: Self-pay | Admitting: *Deleted

## 2012-05-25 DIAGNOSIS — C50919 Malignant neoplasm of unspecified site of unspecified female breast: Secondary | ICD-10-CM

## 2012-05-25 NOTE — Telephone Encounter (Signed)
Per MD, Notified pt MRI of spine normal.  Pt had following questions. 1. What was the MRI of the Abdomen results? 2. Am I going to know the results of PET and Echo right away? 3. When am I going to start Chemo? 4.. When will I have an appt for Chemo class. 5.. Do I have to wait until 7/22 to talk to Dr. Welton Flakes about all of this? The 22nd is a pretty long way off and I was told I should start Chemo right away. 6. Dr. Dayton Scrape said I would start chemo first then radiation., is this right?  Reviewed with Pt as soon as the results come back and Dr. Welton Flakes has reviewed them,s he will be notified. Will review with Dr. Welton Flakes pt's concerns above and will call back with more information.

## 2012-05-25 NOTE — Telephone Encounter (Signed)
Message copied by Cooper Render on Tue May 25, 2012  3:08 PM ------      Message from: Victorino December      Created: Mon May 24, 2012 10:49 PM       Call patient:MRI of spine normal

## 2012-05-25 NOTE — Telephone Encounter (Signed)
Message copied by Cooper Render on Tue May 25, 2012  3:07 PM ------      Message from: Victorino December      Created: Mon May 24, 2012 10:49 PM       Call patient:MRI of spine normal

## 2012-05-26 ENCOUNTER — Encounter (HOSPITAL_COMMUNITY): Payer: Self-pay

## 2012-05-26 ENCOUNTER — Other Ambulatory Visit: Payer: Self-pay | Admitting: Oncology

## 2012-05-26 ENCOUNTER — Encounter: Payer: Self-pay | Admitting: *Deleted

## 2012-05-26 ENCOUNTER — Telehealth: Payer: Self-pay | Admitting: Oncology

## 2012-05-26 ENCOUNTER — Encounter (HOSPITAL_COMMUNITY)
Admission: RE | Admit: 2012-05-26 | Discharge: 2012-05-26 | Disposition: A | Discharge status: Home / Self Care | Payer: Medicaid Other | Source: Ambulatory Visit | Attending: Oncology | Admitting: Oncology

## 2012-05-26 DIAGNOSIS — C7970 Secondary malignant neoplasm of unspecified adrenal gland: Secondary | ICD-10-CM | POA: Insufficient documentation

## 2012-05-26 DIAGNOSIS — C50919 Malignant neoplasm of unspecified site of unspecified female breast: Secondary | ICD-10-CM

## 2012-05-26 MED ORDER — FLUDEOXYGLUCOSE F - 18 (FDG) INJECTION
15.7000 | Freq: Once | INTRAVENOUS | Status: AC | PRN
  Administered 2012-05-26: 15.7 via INTRAVENOUS

## 2012-05-26 NOTE — Telephone Encounter (Signed)
Per MD, pt's MRI of abdomen declined by insurance. Relayed this message to pt. Pt verbalized understanding, although she had received a letter in the mail stating the MRI of spine and abdomen were declined. Pt had concerns regarding MRI spine  has been completed and if this was approved and would she be getting a bill for it later? Offered to transfer pt to managed care who may be able to better explain why MRI of abdomen was declined or she may need  call insurance company for additional information. Pt declined to be transferred at this time.  Asked pt,  "How soon would you like to start Chemo?" Pt states, " I'd like to go to chemo class and know what drugs I will be getting, know what the results of the tests are, then s/w Dr. Welton Flakes and then make my decision about getting chemotherapy. I would like to see Dr. Welton Flakes on the 8th or 9th of July.  I will be getting my port placed on 7/2, I have an appt with Dr. Nash Dimmer at [facility] on 7/3 for a 2nd opinion to see what drugs she would have me take."  Confirmed with pt she would like to be set up for Chemo class as soon as possible, and an appt with Dr. Welton Flakes on 7/8.  Pt verbalized this was correct.  Onc treatement sent for chemo class appt and MD appt 7/8

## 2012-05-26 NOTE — Telephone Encounter (Signed)
Ask her when does she want to start chemo?

## 2012-05-26 NOTE — Telephone Encounter (Signed)
S/w the pt and she is aware of the chemo educ class appt along with the md appt  In July.

## 2012-05-27 ENCOUNTER — Encounter: Payer: Self-pay | Admitting: *Deleted

## 2012-05-27 ENCOUNTER — Telehealth: Payer: Self-pay | Admitting: *Deleted

## 2012-05-27 ENCOUNTER — Other Ambulatory Visit: Payer: Medicaid Other

## 2012-05-27 ENCOUNTER — Ambulatory Visit (HOSPITAL_COMMUNITY)
Admission: RE | Admit: 2012-05-27 | Discharge: 2012-05-27 | Disposition: A | Discharge status: Home / Self Care | Payer: Medicaid Other | Source: Ambulatory Visit | Attending: Oncology | Admitting: Oncology

## 2012-05-27 DIAGNOSIS — Z5111 Encounter for antineoplastic chemotherapy: Secondary | ICD-10-CM

## 2012-05-27 DIAGNOSIS — C50919 Malignant neoplasm of unspecified site of unspecified female breast: Secondary | ICD-10-CM | POA: Insufficient documentation

## 2012-05-27 DIAGNOSIS — C7952 Secondary malignant neoplasm of bone marrow: Secondary | ICD-10-CM | POA: Insufficient documentation

## 2012-05-27 DIAGNOSIS — C7951 Secondary malignant neoplasm of bone: Secondary | ICD-10-CM | POA: Insufficient documentation

## 2012-05-27 DIAGNOSIS — Z01818 Encounter for other preprocedural examination: Secondary | ICD-10-CM | POA: Insufficient documentation

## 2012-05-27 NOTE — Telephone Encounter (Signed)
Message copied by Cooper Render on Thu May 27, 2012  9:55 AM ------      Message from: Breanna Lara      Created: Wed May 26, 2012  4:18 PM       Call patient: PET scan no evidence of mets disease

## 2012-05-27 NOTE — Telephone Encounter (Signed)
Pt at registration checking in, requested to speak with RN regarding PET Scan Met with pt in lobby per her request and notified per MD Pet Scan , no evidence of mets disease. Explained to pt I was glad to speak with her and give her results. Reviewed with pt upcoming appt with Dr. Welton Flakes on 7/8. Pt advised she had wrote it down and declined copy of appts/schedule. Pt denied needing further assistance at this time.

## 2012-05-27 NOTE — Progress Notes (Signed)
*  PRELIMINARY RESULTS* Echocardiogram 2D Echocardiogram has been performed.  Breanna Lara 05/27/2012, 12:25 PM

## 2012-05-27 NOTE — Telephone Encounter (Signed)
No additional notes

## 2012-05-27 NOTE — Telephone Encounter (Signed)
Message copied by Cooper Render on Thu May 27, 2012  2:20 PM ------      Message from: Victorino December      Created: Wed May 26, 2012  4:18 PM       Call patient: PET scan no evidence of mets disease

## 2012-05-28 ENCOUNTER — Other Ambulatory Visit: Payer: Self-pay | Admitting: Radiology

## 2012-05-31 ENCOUNTER — Telehealth (INDEPENDENT_AMBULATORY_CARE_PROVIDER_SITE_OTHER): Payer: Self-pay | Admitting: General Surgery

## 2012-05-31 ENCOUNTER — Telehealth: Payer: Self-pay | Admitting: Medical Oncology

## 2012-05-31 ENCOUNTER — Telehealth (HOSPITAL_COMMUNITY): Payer: Self-pay | Admitting: Oncology

## 2012-05-31 ENCOUNTER — Other Ambulatory Visit: Payer: Self-pay | Admitting: Radiology

## 2012-05-31 NOTE — Telephone Encounter (Signed)
Patient LMOVM requesting results of ECHO done last week.  Will review with MD

## 2012-05-31 NOTE — Telephone Encounter (Signed)
Per MD, informed patient that ECHO was normal at 60-65%.  No further questions at this time, instructed patient to contact clinic if any questions arise.

## 2012-05-31 NOTE — Telephone Encounter (Signed)
Patient calling because she has had two port a caths before. After she had the last port a cath removed she developed a keloid at the site of her incision. This has since faded and gone away per patient. She is supposed to have a port a cath placed by interventional radiology tomorrow and wants Dr Tenna Child opinion on whether she should have this placed by a surgeon instead. I told her it should be fine, but she would like the opinion of Dr Jamey Ripa. Please advise.

## 2012-05-31 NOTE — Telephone Encounter (Signed)
Message copied by Liliana Cline on Mon May 31, 2012  4:29 PM ------      Message from: Zacarias Pontes      Created: Mon May 31, 2012  3:18 PM       Called wanting to ask a question about a port.  She had 2 taken out by Dr. Jamey Ripa and had complications.  She needs to have a port put in and would have a question about the procedure.

## 2012-06-01 ENCOUNTER — Telehealth: Payer: Self-pay | Admitting: Nutrition

## 2012-06-01 ENCOUNTER — Other Ambulatory Visit: Payer: Self-pay | Admitting: Oncology

## 2012-06-01 ENCOUNTER — Ambulatory Visit (HOSPITAL_COMMUNITY)
Admission: RE | Admit: 2012-06-01 | Discharge: 2012-06-01 | Disposition: A | Discharge status: Home / Self Care | Payer: Medicaid Other | Source: Ambulatory Visit | Attending: Oncology | Admitting: Oncology

## 2012-06-01 DIAGNOSIS — C50919 Malignant neoplasm of unspecified site of unspecified female breast: Secondary | ICD-10-CM | POA: Insufficient documentation

## 2012-06-01 DIAGNOSIS — Z79899 Other long term (current) drug therapy: Secondary | ICD-10-CM | POA: Insufficient documentation

## 2012-06-01 LAB — BASIC METABOLIC PANEL
CO2: 28 mEq/L (ref 19–32)
Calcium: 9.3 mg/dL (ref 8.4–10.5)
Glucose, Bld: 95 mg/dL (ref 70–99)
Sodium: 137 mEq/L (ref 135–145)

## 2012-06-01 LAB — CBC WITH DIFFERENTIAL/PLATELET
Eosinophils Absolute: 0.2 10*3/uL (ref 0.0–0.7)
Eosinophils Relative: 3 % (ref 0–5)
HCT: 38 % (ref 36.0–46.0)
Hemoglobin: 12.5 g/dL (ref 12.0–15.0)
Lymphs Abs: 2.7 10*3/uL (ref 0.7–4.0)
MCH: 28.6 pg (ref 26.0–34.0)
MCV: 87 fL (ref 78.0–100.0)
Monocytes Absolute: 0.4 10*3/uL (ref 0.1–1.0)
Monocytes Relative: 5 % (ref 3–12)
RBC: 4.37 MIL/uL (ref 3.87–5.11)

## 2012-06-01 MED ORDER — CEFAZOLIN SODIUM-DEXTROSE 2-3 GM-% IV SOLR: 2.0000 g | Freq: Once | INTRAVENOUS | Status: DC

## 2012-06-01 MED ORDER — FENTANYL CITRATE 0.05 MG/ML IJ SOLN
INTRAMUSCULAR | Status: AC
  Filled 2012-06-01: qty 4

## 2012-06-01 MED ORDER — MIDAZOLAM HCL 2 MG/2ML IJ SOLN
INTRAMUSCULAR | Status: AC
  Filled 2012-06-01: qty 4

## 2012-06-01 MED ORDER — HEPARIN SOD (PORK) LOCK FLUSH 100 UNIT/ML IV SOLN
500.0000 [IU] | Freq: Once | INTRAVENOUS | Status: AC
  Administered 2012-06-01: 500 [IU] via INTRAVENOUS

## 2012-06-01 MED ORDER — SODIUM CHLORIDE 0.9 % IV SOLN
Freq: Once | INTRAVENOUS | Status: AC
  Administered 2012-06-01: 15:00:00 via INTRAVENOUS

## 2012-06-01 MED ORDER — MIDAZOLAM HCL 5 MG/5ML IJ SOLN
INTRAMUSCULAR | Status: AC | PRN
  Administered 2012-06-01 (×2): 2 mg via INTRAVENOUS

## 2012-06-01 MED ORDER — FENTANYL CITRATE 0.05 MG/ML IJ SOLN
INTRAMUSCULAR | Status: AC | PRN
  Administered 2012-06-01 (×2): 100 ug via INTRAVENOUS

## 2012-06-01 MED ORDER — CEFAZOLIN SODIUM 1-5 GM-% IV SOLN
INTRAVENOUS | Status: AC
  Administered 2012-06-01: 1000 mg
  Filled 2012-06-01: qty 50

## 2012-06-01 NOTE — Procedures (Signed)
Successful placement of left IJ approach port-a-cath with tip at superior aspect of the right atrium. The catheter is ready for immediate use. No immediate post procedural complications.  

## 2012-06-01 NOTE — H&P (Signed)
Breanna Lara is an 41 y.o. female.   Chief Complaint: "I'm here for a port a cath" HPI: Patient with history of recurrent right breast carcinoma presents today for port a cath placement for chemotherapy.  Past Medical History  Diagnosis Date  . Breast cancer 2001, 2010     S/P Rt mastectomy  BRAC neg  . Ectopic pregnancy, tubal 1998  . Irritable bowel syndrome   . Hemorrhoids   . History of toe surgery   . Anemia     H/O  . Blood transfusion, without reported diagnosis   . Genital HSV   . ASCUS (atypical squamous cells of undetermined significance) on Pap smear 2009    colpo  . Type o blood, rh negative   . Anxiety   . Hx of radiation therapy 01/04/01 - 02/19/01    right breast  . History of chemotherapy     Past Surgical History  Procedure Date  . Mastectomy 06/18/09    RT  . Cholecystectomy   . Laparoscopy     salpingectomy  . Eye surgery   . Nasal septum surgery     deviated septum  . Breast lumpectomy 06/05/00    right, ER/PR +, HER 2-  . Axillary node dissection 04/29/12    right, 0/4 nodes pos, mass bx- met adenocarcinoma  . Chest wall biopsy 04/07/12    soft tissue, right -metastatic adenocarcinoma, ER/PR +, HER2 -    Family History  Problem Relation Age of Onset  . Heart attack Father   . Hypertension Father   . Diabetes type II Father   . Kidney disease Father   . Melanoma Father 20    malignant  . Hypertension Brother   . Hypertension Mother   . Thyroid disease Mother     hypothyroid  . Hypothyroidism Mother   . Thyroid disease Sister     hypothyroid  . Diabetes Sister   . Hypothyroidism Sister   . Cancer Paternal Grandfather     lung  . Cancer Maternal Grandmother     pancreatic  . Breast cancer Maternal Aunt 22   Social History:  reports that she has never smoked. She has never used smokeless tobacco. She reports that she does not drink alcohol or use illicit drugs.  Allergies:  Allergies  Allergen Reactions  . Percocet  (Oxycodone-Acetaminophen) Nausea Only  Current outpatient prescriptions:cholecalciferol (VITAMIN D) 1000 UNITS tablet, Take 1,000 Units by mouth daily.  , Disp: , Rfl: ;  hydrocortisone-pramoxine (PROCTOFOAM-HC) rectal foam, Place 1 applicator rectally 3 (three) times daily as needed., Disp: 10 g, Rfl: 1;  Multiple Vitamin (MULTIVITAMIN PO), Take by mouth daily.  , Disp: , Rfl: ;  polyethylene glycol powder (GLYCOLAX/MIRALAX) powder, Take 17 g by mouth as needed. , Disp: , Rfl:  Current facility-administered medications:0.9 %  sodium chloride infusion, , Intravenous, Once, [employer], PA;  ceFAZolin (ANCEF) 1-5 GM-% IVPB, , , , ;  ceFAZolin (ANCEF) IVPB 2 g/50 mL premix, 2 g, Intravenous, Once, [employer], PA;  influenza  inactive virus vaccine (FLUZONE/FLUARIX) injection 0.5 mL, 0.5 mL, Intramuscular, Once, Lesly Dukes, MD     Results for orders placed during the hospital encounter of 06/01/12 (from the past 48 hour(s))  APTT     Status: Normal   Collection Time   06/01/12 12:50 PM      Component Value Range Comment   aPTT 33  24 - 37 seconds   CBC WITH DIFFERENTIAL     Status:  Normal   Collection Time   06/01/12 12:50 PM      Component Value Range Comment   WBC 7.8  4.0 - 10.5 K/uL    RBC 4.37  3.87 - 5.11 MIL/uL    Hemoglobin 12.5  12.0 - 15.0 g/dL    HCT 16.1  09.6 - 04.5 %    MCV 87.0  78.0 - 100.0 fL    MCH 28.6  26.0 - 34.0 pg    MCHC 32.9  30.0 - 36.0 g/dL    RDW 40.9  81.1 - 91.4 %    Platelets 295  150 - 400 K/uL    Neutrophils Relative 57  43 - 77 %    Neutro Abs 4.4  1.7 - 7.7 K/uL    Lymphocytes Relative 35  12 - 46 %    Lymphs Abs 2.7  0.7 - 4.0 K/uL    Monocytes Relative 5  3 - 12 %    Monocytes Absolute 0.4  0.1 - 1.0 K/uL    Eosinophils Relative 3  0 - 5 %    Eosinophils Absolute 0.2  0.0 - 0.7 K/uL    Basophils Relative 1  0 - 1 %    Basophils Absolute 0.0  0.0 - 0.1 K/uL   PROTIME-INR     Status: Normal   Collection Time   06/01/12 12:50 PM      Component  Value Range Comment   Prothrombin Time 13.0  11.6 - 15.2 seconds    INR 0.96  0.00 - 1.49    No results found.  Review of Systems  Constitutional: Negative for fever and chills.  HENT:       Occ HA's  Respiratory: Negative for cough and shortness of breath.   Cardiovascular: Negative for chest pain.  Gastrointestinal: Negative for nausea, vomiting and abdominal pain.  Musculoskeletal: Negative for back pain.  Endo/Heme/Allergies: Does not bruise/bleed easily.    Blood pressure 121/73, pulse 70, temperature 97.9 F (36.6 C), resp. rate 21, last menstrual period 06/01/2012, SpO2 100.00%. Physical Exam  Constitutional: She is oriented to person, place, and time. She appears well-developed and well-nourished.  Cardiovascular: Normal rate and regular rhythm.   Respiratory: Effort normal and breath sounds normal.  GI: Soft. Bowel sounds are normal. There is no tenderness.  Musculoskeletal: Normal range of motion.       Trace edema bilat  Neurological: She is alert and oriented to person, place, and time.     Assessment/Plan Patient with history of recurrent right breast carcinoma; plan is for port a cath placement for chemotherapy. Details/risks of above d/w pt with her understanding and consent.  ALLRED,D KEVIN 06/01/2012, 1:34 PM

## 2012-06-01 NOTE — Telephone Encounter (Signed)
Patient made aware per Dr Jamey Ripa okay to proceed with port placement with interventional radiology.

## 2012-06-01 NOTE — Telephone Encounter (Signed)
Patient requested call regarding nutrition information secondary to new breast cancer diagnosis.  I returned her call but she was not available.  I will call her back later.  I would like to invite her to attend the breast cancer nutrition class being held on July 16th.

## 2012-06-07 ENCOUNTER — Ambulatory Visit (HOSPITAL_BASED_OUTPATIENT_CLINIC_OR_DEPARTMENT_OTHER): Payer: Medicaid Other | Admitting: Oncology

## 2012-06-07 ENCOUNTER — Telehealth: Payer: Self-pay | Admitting: *Deleted

## 2012-06-07 ENCOUNTER — Other Ambulatory Visit (HOSPITAL_BASED_OUTPATIENT_CLINIC_OR_DEPARTMENT_OTHER): Payer: Medicaid Other | Admitting: Lab

## 2012-06-07 ENCOUNTER — Encounter: Payer: Self-pay | Admitting: Oncology

## 2012-06-07 VITALS — BP 131/83 | HR 64 | Temp 98.9°F | Ht 67.0 in | Wt 182.6 lb

## 2012-06-07 DIAGNOSIS — C44599 Other specified malignant neoplasm of skin of other part of trunk: Secondary | ICD-10-CM

## 2012-06-07 DIAGNOSIS — Z9010 Acquired absence of unspecified breast and nipple: Secondary | ICD-10-CM

## 2012-06-07 DIAGNOSIS — Z171 Estrogen receptor negative status [ER-]: Secondary | ICD-10-CM

## 2012-06-07 DIAGNOSIS — C50919 Malignant neoplasm of unspecified site of unspecified female breast: Secondary | ICD-10-CM

## 2012-06-07 DIAGNOSIS — Z853 Personal history of malignant neoplasm of breast: Secondary | ICD-10-CM

## 2012-06-07 LAB — CBC WITH DIFFERENTIAL/PLATELET
Basophils Absolute: 0.1 10*3/uL (ref 0.0–0.1)
EOS%: 3.1 % (ref 0.0–7.0)
Eosinophils Absolute: 0.3 10*3/uL (ref 0.0–0.5)
HCT: 36.9 % (ref 34.8–46.6)
HGB: 12.4 g/dL (ref 11.6–15.9)
MCH: 29.1 pg (ref 25.1–34.0)
MCV: 86.9 fL (ref 79.5–101.0)
MONO%: 5.9 % (ref 0.0–14.0)
NEUT%: 59.9 % (ref 38.4–76.8)

## 2012-06-07 MED ORDER — PROCHLORPERAZINE MALEATE 10 MG PO TABS: 10.0000 mg | ORAL_TABLET | Freq: Four times a day (QID) | ORAL | Status: DC | PRN

## 2012-06-07 MED ORDER — PROCHLORPERAZINE 25 MG RE SUPP: 25.0000 mg | Freq: Two times a day (BID) | RECTAL | Status: DC | PRN

## 2012-06-07 MED ORDER — LORAZEPAM 0.5 MG PO TABS: 0.5000 mg | ORAL_TABLET | Freq: Four times a day (QID) | ORAL | Status: DC | PRN

## 2012-06-07 MED ORDER — LIDOCAINE-PRILOCAINE 2.5-2.5 % EX CREA: TOPICAL_CREAM | CUTANEOUS | Status: DC | PRN

## 2012-06-07 MED ORDER — ONDANSETRON HCL 8 MG PO TABS: ORAL_TABLET | ORAL | Status: DC

## 2012-06-07 MED ORDER — DEXAMETHASONE 4 MG PO TABS: ORAL_TABLET | ORAL | Status: DC

## 2012-06-07 NOTE — Patient Instructions (Addendum)
1. Proceed with CMF chemotherapy on 7/11  2. All anti-nausea medications were sent to your pharmacy  3. I will see you back on 7/18

## 2012-06-07 NOTE — Telephone Encounter (Signed)
Per staff message and POF I have scheduled appts.  JMW  

## 2012-06-07 NOTE — Progress Notes (Signed)
OFFICE PROGRESS NOTE    No primary provider on file. No primary provider on file.  DIAGNOSIS: 41 year old female with history of invasive ductal carcinoma of the right breast. She was originally diagnosed in June 2011 with a stage II breast cancer. At that time she underwent a lumpectomy followed by chemotherapy given adjuvantly consisting of Adriamycin and Taxotere following CALGB 16109 study. She then went on to receive 5 years of tamoxifen.  She was then diagnosed with a second cancer in the ipsilateral breast measuring 1-0.1 and 1.6 cm with LVI. It was triple negative. She underwent a mastectomy on 06/18/2009 followed by adjuvant chemotherapy consisting of Taxol and carboplatinum which she completed in December of 2010.  PRIOR THERAPY:  #1 stage II right breast cancer diagnosed 2001 status post lumpectomy followed by adjuvant chemotherapy consisting of Adriamycin and Cytoxan following CALGB 60454 study. She then received 5 years of tamoxifen.  #2 second cancer in the ipsilateral breast measuring 2.1 and 1.6 cm with LVI. ER negative PR negative HER-2/neu negative. Status post mastectomy 06/18/2009. She then received Taxol and carboplatinum from 08/16/2009 through 11/08/2009.  #3 the patient was recently seen at [facility] by her surgeon. She was noted to have some pain. She went on to have staging scans performed which revealed no evidence of disease. However on clinical exam patient apparently had a nodule in the right anterior chest wall. This has been biopsied and the pathology is consistent with a recurrent invasive ductal carcinoma that is ER positive PR positive.  #4 patient is now status post right axillary lymph node dissection for level II and 3 lymph nodes there was no evidence of malignancy in 4 lymph nodes. There was soft tissue taken called Aundria Rud node excision that showed 3.1 cm metastatic adenocarcinoma with perineural invasion apparently the tumor was ER positive and PR  positive. No lymph node tissue was identified possibly representing either an entirely replaced lymph node with extracapsular extension or a soft tissue metastasis.  CURRENT THERAPY: Patient will begin CMF on 06/10/2012  INTERVAL HISTORY: Breanna Lara 41 y.o. female returns for followup visit today.Overall patient looks good she is doing well her incision site looks good. She has had a chance to get a second opinion at [facility] by Dr. Tomi Likens. Who has recurrent recommended systemic chemotherapy. Patient denies any fevers chills night sweats headaches. She is now anxious to get started on treatment. Remainder of the 10 point review of systems is negative.   MEDICAL HISTORY: Past Medical History  Diagnosis Date  . Breast cancer 2001, 2010     S/P Rt mastectomy  BRAC neg  . Ectopic pregnancy, tubal 1998  . Irritable bowel syndrome   . Hemorrhoids   . History of toe surgery   . Anemia     H/O  . Blood transfusion, without reported diagnosis   . Genital HSV   . ASCUS (atypical squamous cells of undetermined significance) on Pap smear 2009    colpo  . Type o blood, rh negative   . Anxiety   . Hx of radiation therapy 01/04/01 - 02/19/01    right breast  . History of chemotherapy     ALLERGIES:  is allergic to percocet.  MEDICATIONS:  Current Outpatient Prescriptions  Medication Sig Dispense Refill  . cholecalciferol (VITAMIN D) 1000 UNITS tablet Take 1,000 Units by mouth daily.        . hydrocortisone-pramoxine (PROCTOFOAM-HC) rectal foam Place 1 applicator rectally 3 (three) times daily as needed.  10 g  1  . Multiple Vitamin (MULTIVITAMIN PO) Take by mouth daily.        . polyethylene glycol powder (GLYCOLAX/MIRALAX) powder Take 17 g by mouth as needed.       Marland Kitchen dexamethasone (DECADRON) 4 MG tablet Take 2 tablets by mouth daily starting the day after chemotherapy for 2 days. Take with food.  30 tablet  1  . lidocaine-prilocaine (EMLA) cream Apply topically as needed.  30 g  6    . LORazepam (ATIVAN) 0.5 MG tablet Take 1 tablet (0.5 mg total) by mouth every 6 (six) hours as needed (Nausea or vomiting).  30 tablet  0  . ondansetron (ZOFRAN) 8 MG tablet Take 1 tablet two times a day starting the day after chemo for 2 days. Then take 1 tablet two times a day as needed for nausea or vomiting.  30 tablet  1  . prochlorperazine (COMPAZINE) 10 MG tablet Take 1 tablet (10 mg total) by mouth every 6 (six) hours as needed (Nausea or vomiting).  30 tablet  1  . prochlorperazine (COMPAZINE) 25 MG suppository Place 1 suppository (25 mg total) rectally every 12 (twelve) hours as needed for nausea.  12 suppository  3   Current Facility-Administered Medications  Medication Dose Route Frequency Provider Last Rate Last Dose  . influenza  inactive virus vaccine (FLUZONE/FLUARIX) injection 0.5 mL  0.5 mL Intramuscular Once Lesly Dukes, MD        SURGICAL HISTORY:  Past Surgical History  Procedure Date  . Mastectomy 06/18/09    RT  . Cholecystectomy   . Laparoscopy     salpingectomy  . Eye surgery   . Nasal septum surgery     deviated septum  . Breast lumpectomy 06/05/00    right, ER/PR +, HER 2-  . Axillary node dissection 04/29/12    right, 0/4 nodes pos, mass bx- met adenocarcinoma  . Chest wall biopsy 04/07/12    soft tissue, right -metastatic adenocarcinoma, ER/PR +, HER2 -    REVIEW OF SYSTEMS:  Pertinent items are noted in HPI.   PHYSICAL EXAMINATION: General appearance: alert Remainder of the physical exam was not performed.  Chest wall mastectomy site looks clean there is no evidence of local recurrence.  ECOG PERFORMANCE STATUS: 0 - Asymptomatic  Blood pressure 131/83, pulse 64, temperature 98.9 F (37.2 C), temperature source Oral, height 5\' 7"  (1.702 m), weight 182 lb 9.6 oz (82.827 kg), last menstrual period 06/01/2012.  LABORATORY DATA: Lab Results  Component Value Date   WBC 8.9 06/07/2012   HGB 12.4 06/07/2012   HCT 36.9 06/07/2012   MCV 86.9 06/07/2012    PLT 279 06/07/2012      Chemistry      Component Value Date/Time   NA 137 06/01/2012 1250   K 3.9 06/01/2012 1250   CL 100 06/01/2012 1250   CO2 28 06/01/2012 1250   BUN 10 06/01/2012 1250   CREATININE 0.86 06/01/2012 1250      Component Value Date/Time   CALCIUM 9.3 06/01/2012 1250   ALKPHOS 70 04/22/2012 1522   AST 21 04/22/2012 1522   ALT 20 04/22/2012 1522   BILITOT 0.2* 04/22/2012 1522       RADIOGRAPHIC STUDIES:  No results found.  ASSESSMENT: 41 year old female with:  1.  history of 2 primary breast cancers in the same breast. She has undergone a mastectomy. Patient now with recurrent breast cancer 2 possibly the right. Lymph nodes and chest wall. patient now with  recurrent breast cancer at that is ER positive PR positive likely her original disease back in 2001. Patient has had a right axillary lymph node dissection and removal of what looks like a Rotters node.  #2 patient has had second opinion at [facility] by Dr. Tomi Likens one of the medical oncologists there. Her recommendation was CMF combination chemotherapy followed by an antiestrogen therapy. I do concur with this. However I do think that we should give the CMF concurrently with her radiation therapy. Patient and I discussed this today. I will plan on giving HER-2 cycles of CMF up front. Then she could proceed with getting cyclophosphamide and 5-FU concurrently with radiation therapy. Once she completes her radiation then we will continue the CMF combination. A total of 6 cycles is planned tentatively.  PLAN  #1 The patient and I and her husband discussed risks and benefits of the treatment and the rationale. They both concur with this recommendation at this point. We also discussed her PET scan MRI results today. Currently patient has no evidence of distant disease. But she does realize that it is only a matter of time when she will develop distant disease and that is why we do need systemic treatment.  #2 patient and I did  discuss the rationale for giving her antiestrogen therapy. I do think that she would derive more benefit from an aromatase inhibitor. Because patient is premenopausal we would have to see how she would do after her chemotherapy. It may be she may require oophorectomies then do an antiestrogen therapy with an aromatase inhibitor such as Femara.  #3 patient will receive cycle #1 of CMF on 7/11. Her antiemetics were sent to her pharmacy EMLA cream for her Port-A-Cath.  All questions were answered. The patient knows to call the clinic with any problems, questions or concerns. We can certainly see the patient much sooner if necessary.  I spent counseling the patient face to face. The total time spent in the appointment was 30 minutes.    Drue Second, MD Medical/Oncology [facility] [phone] (beeper) [phone] (Office)

## 2012-06-08 ENCOUNTER — Telehealth: Payer: Self-pay | Admitting: *Deleted

## 2012-06-08 ENCOUNTER — Telehealth: Payer: Self-pay | Admitting: Nutrition

## 2012-06-08 NOTE — Telephone Encounter (Signed)
Patient is interested in coming to our breast cancer nutrition class on Tuesday, July 16 from 6-7 pm.  I gave patient the details and we will call and remind her of the appointment.

## 2012-06-08 NOTE — Telephone Encounter (Signed)
sent michelle email to set up patient treatment

## 2012-06-09 ENCOUNTER — Telehealth: Payer: Self-pay | Admitting: *Deleted

## 2012-06-09 NOTE — Telephone Encounter (Signed)
Per staff message I have adjusted treamtents to follow MD visit.  JMW

## 2012-06-10 ENCOUNTER — Ambulatory Visit (HOSPITAL_BASED_OUTPATIENT_CLINIC_OR_DEPARTMENT_OTHER): Payer: Medicaid Other

## 2012-06-10 ENCOUNTER — Other Ambulatory Visit (HOSPITAL_BASED_OUTPATIENT_CLINIC_OR_DEPARTMENT_OTHER): Payer: Medicaid Other | Admitting: Lab

## 2012-06-10 VITALS — BP 126/83 | HR 78 | Temp 98.0°F

## 2012-06-10 DIAGNOSIS — C50919 Malignant neoplasm of unspecified site of unspecified female breast: Secondary | ICD-10-CM

## 2012-06-10 DIAGNOSIS — Z5111 Encounter for antineoplastic chemotherapy: Secondary | ICD-10-CM

## 2012-06-10 LAB — COMPREHENSIVE METABOLIC PANEL
Albumin: 4.1 g/dL (ref 3.5–5.2)
Alkaline Phosphatase: 73 U/L (ref 39–117)
BUN: 10 mg/dL (ref 6–23)
Creatinine, Ser: 0.84 mg/dL (ref 0.50–1.10)
Glucose, Bld: 99 mg/dL (ref 70–99)
Potassium: 3.9 mEq/L (ref 3.5–5.3)

## 2012-06-10 LAB — CBC WITH DIFFERENTIAL/PLATELET
Basophils Absolute: 0.1 10*3/uL (ref 0.0–0.1)
EOS%: 2.7 % (ref 0.0–7.0)
HGB: 12.6 g/dL (ref 11.6–15.9)
LYMPH%: 30.1 % (ref 14.0–49.7)
MCH: 28.6 pg (ref 25.1–34.0)
MCV: 85.5 fL (ref 79.5–101.0)
MONO%: 5.6 % (ref 0.0–14.0)
Platelets: 285 10*3/uL (ref 145–400)
RBC: 4.41 10*6/uL (ref 3.70–5.45)
RDW: 13.5 % (ref 11.2–14.5)

## 2012-06-10 MED ORDER — SODIUM CHLORIDE 0.9 % IJ SOLN
10.0000 mL | INTRAMUSCULAR | Status: DC | PRN
  Administered 2012-06-10: 10 mL
  Filled 2012-06-10: qty 10

## 2012-06-10 MED ORDER — METHOTREXATE SODIUM CHEMO INJECTION 25 MG/ML
40.0000 mg/m2 | Freq: Once | INTRAMUSCULAR | Status: AC
  Administered 2012-06-10: 80 mg via INTRAVENOUS
  Filled 2012-06-10: qty 3.2

## 2012-06-10 MED ORDER — HEPARIN SOD (PORK) LOCK FLUSH 100 UNIT/ML IV SOLN
500.0000 [IU] | Freq: Once | INTRAVENOUS | Status: AC | PRN
  Administered 2012-06-10: 500 [IU]
  Filled 2012-06-10: qty 5

## 2012-06-10 MED ORDER — SODIUM CHLORIDE 0.9 % IV SOLN
Freq: Once | INTRAVENOUS | Status: AC
  Administered 2012-06-10: 50 mL via INTRAVENOUS

## 2012-06-10 MED ORDER — SODIUM CHLORIDE 0.9 % IV SOLN
600.0000 mg/m2 | Freq: Once | INTRAVENOUS | Status: AC
  Administered 2012-06-10: 1180 mg via INTRAVENOUS
  Filled 2012-06-10: qty 59

## 2012-06-10 MED ORDER — DEXAMETHASONE SODIUM PHOSPHATE 10 MG/ML IJ SOLN
10.0000 mg | Freq: Once | INTRAMUSCULAR | Status: AC
  Administered 2012-06-10: 10 mg via INTRAVENOUS

## 2012-06-10 MED ORDER — ONDANSETRON 8 MG/50ML IVPB (CHCC)
8.0000 mg | Freq: Once | INTRAVENOUS | Status: AC
  Administered 2012-06-10: 8 mg via INTRAVENOUS

## 2012-06-10 MED ORDER — FLUOROURACIL CHEMO INJECTION 2.5 GM/50ML
600.0000 mg/m2 | Freq: Once | INTRAVENOUS | Status: AC
  Administered 2012-06-10: 1200 mg via INTRAVENOUS
  Filled 2012-06-10: qty 24

## 2012-06-10 NOTE — Progress Notes (Signed)
Pt. began new chemo today. Pt.stated " this is my third round". Pt. Was not notified of change appt.  And arrived early.  Infusion was able to accomodate pt. Early today and scheduling process reviewed with pt.  Verbal teaching reviewed with pt and visitor new chemo drugs, s/e.  Pt. Denied any issues at this time.  HL

## 2012-06-10 NOTE — Patient Instructions (Signed)
[facility] Discharge Instructions for Patients Receiving Chemotherapy  Today you received the following chemotherapy agents cyclophasmide, methroraxe, 61fu. To help prevent nausea and vomiting after your treatment, we encourage you to take your nausea medication  Per Dr. Welton Flakes.    If you develop nausea and vomiting that is not controlled by your nausea medication, call the clinic. If it is after clinic hours your family physician or the after hours number for the clinic or go to the Emergency Department.   BELOW ARE SYMPTOMS THAT SHOULD BE REPORTED IMMEDIATELY:  *FEVER GREATER THAN 100.5 F  *CHILLS WITH OR WITHOUT FEVER  NAUSEA AND VOMITING THAT IS NOT CONTROLLED WITH YOUR NAUSEA MEDICATION  *UNUSUAL SHORTNESS OF BREATH  *UNUSUAL BRUISING OR BLEEDING  TENDERNESS IN MOUTH AND THROAT WITH OR WITHOUT PRESENCE OF ULCERS  *URINARY PROBLEMS  *BOWEL PROBLEMS  UNUSUAL RASH Items with * indicate a potential emergency and should be followed up as soon as possible.  . Feel free to call the clinic you have any questions or concerns. The clinic phone number is [phone].   I have been informed and understand all the instructions given to me. I know to contact the clinic, my physician, or go to the Emergency Department if any problems should occur. I do not have any questions at this time, but understand that I may call the clinic during office hours   should I have any questions or need assistance in obtaining follow up care.    __________________________________________  _____________  __________ Signature of Patient or Authorized Representative            Date                   Time    __________________________________________ Nurse's Signature

## 2012-06-11 ENCOUNTER — Other Ambulatory Visit: Payer: Self-pay | Admitting: Certified Registered Nurse Anesthetist

## 2012-06-11 ENCOUNTER — Telehealth: Payer: Self-pay | Admitting: *Deleted

## 2012-06-11 NOTE — Telephone Encounter (Signed)
Called patient and message left requesting a return call for follow up after first Adria/Cytox chemotherapy.

## 2012-06-11 NOTE — Telephone Encounter (Signed)
Message copied by Augusto Garbe on Fri Jun 11, 2012 10:15 AM ------      Message from: Amado Coe      Created: Thu Jun 10, 2012 10:58 AM      Regarding: NEW CHEMO F/U       (home) [phone], reccurance... New CMF today 06/10/12.  Dr. Welton Flakes.

## 2012-06-15 ENCOUNTER — Other Ambulatory Visit: Payer: Self-pay | Admitting: *Deleted

## 2012-06-15 DIAGNOSIS — K649 Unspecified hemorrhoids: Secondary | ICD-10-CM

## 2012-06-15 MED ORDER — HYDROCORTISONE ACE-PRAMOXINE 1-1 % RE FOAM: 1.0000 | Freq: Three times a day (TID) | RECTAL | Status: DC | PRN

## 2012-06-15 NOTE — Telephone Encounter (Signed)
Pt called stating that she has had a reoccurance of breast cancer RT breast under the muscle.  ERPR receptive positive.  Per her surgeon may need to either have oophorectomy or Lupron Depot.  She will come into the office within the next 3-4 months to discuss options.  She is currently going through 6 rounds of chemo and radiation in between.  Due to the Chemo she is having some diarrhea and request a RF on her Proctofoam which was sent to [facility] in [facility].

## 2012-06-16 ENCOUNTER — Encounter: Payer: Self-pay | Admitting: Dietician

## 2012-06-16 ENCOUNTER — Telehealth: Payer: Self-pay | Admitting: Oncology

## 2012-06-16 NOTE — Progress Notes (Signed)
Brief Out-patient Oncology Nutrition Note  Reason: Patient attended breast cancer nutrition class.   I have educated the patient on plant-based diet. I also discussed and provided nutrition handouts and research based evidence on breast cancer nutrition. We discussed nutrition management for symptoms associated with treatment. The class lasted a duration of 1 hour. The patient's asked good questions and were without any further nutrition related questions or concerns. RD contact information was provided. Patient's were instructed to contact RD for future nutrition questions or concerns.   RD available for nutrition needs.   Breanna Lara Devina [phone] 

## 2012-06-16 NOTE — Telephone Encounter (Signed)
Called pt today to discuss her schedule. Pt needs to be done with appts by 1pm and there are days when our schedules here do not permit this request. lmonvm for pt confirming appt d/t for tomorrow 7/18 @ 12:30 pm and asked that pt return my call or she me when she comes tomorrow re her schedule.

## 2012-06-17 ENCOUNTER — Encounter: Payer: Self-pay | Admitting: Oncology

## 2012-06-17 ENCOUNTER — Other Ambulatory Visit: Payer: Medicaid Other | Admitting: Lab

## 2012-06-17 ENCOUNTER — Telehealth: Payer: Self-pay | Admitting: Oncology

## 2012-06-17 ENCOUNTER — Telehealth: Payer: Self-pay | Admitting: *Deleted

## 2012-06-17 ENCOUNTER — Other Ambulatory Visit (HOSPITAL_BASED_OUTPATIENT_CLINIC_OR_DEPARTMENT_OTHER): Payer: Medicaid Other | Admitting: Lab

## 2012-06-17 ENCOUNTER — Ambulatory Visit (HOSPITAL_BASED_OUTPATIENT_CLINIC_OR_DEPARTMENT_OTHER): Payer: Medicaid Other | Admitting: Oncology

## 2012-06-17 VITALS — BP 113/81 | HR 85 | Temp 98.3°F | Ht 67.0 in | Wt 180.6 lb

## 2012-06-17 DIAGNOSIS — C50919 Malignant neoplasm of unspecified site of unspecified female breast: Secondary | ICD-10-CM

## 2012-06-17 DIAGNOSIS — Z170 Estrogen receptor positive status [ER+]: Secondary | ICD-10-CM

## 2012-06-17 LAB — CBC WITH DIFFERENTIAL/PLATELET
Basophils Absolute: 0.1 10*3/uL (ref 0.0–0.1)
Eosinophils Absolute: 0.2 10*3/uL (ref 0.0–0.5)
HCT: 34.9 % (ref 34.8–46.6)
HGB: 11.7 g/dL (ref 11.6–15.9)
MCV: 85.7 fL (ref 79.5–101.0)
MONO%: 4.5 % (ref 0.0–14.0)
NEUT#: 4.3 10*3/uL (ref 1.5–6.5)
RDW: 13.2 % (ref 11.2–14.5)
lymph#: 2 10*3/uL (ref 0.9–3.3)

## 2012-06-17 LAB — COMPREHENSIVE METABOLIC PANEL
Albumin: 4.2 g/dL (ref 3.5–5.2)
Alkaline Phosphatase: 58 U/L (ref 39–117)
BUN: 11 mg/dL (ref 6–23)
Calcium: 9.3 mg/dL (ref 8.4–10.5)
Creatinine, Ser: 0.98 mg/dL (ref 0.50–1.10)
Glucose, Bld: 98 mg/dL (ref 70–99)
Potassium: 4.4 mEq/L (ref 3.5–5.3)

## 2012-06-17 NOTE — Telephone Encounter (Signed)
All of pt's f/u appts w/KK have been moved to 9 am. Other appts including chemo have been moved accordingly. I spoke Breanna Lara today and she is aware that all of her appts are now 8:30 am and barring any unforseen issues on the lb/fu/chemo days she should be out by 1 pm. Pt will get new schedule when she comes in on 8/1 @ 8:30 am.

## 2012-06-17 NOTE — Patient Instructions (Addendum)
Your blood counts look good.   I will see you back on 07/01/12 for cycle 2 of CMF

## 2012-06-17 NOTE — Progress Notes (Signed)
OFFICE PROGRESS NOTE    No primary provider on file. No primary provider on file.  DIAGNOSIS: 41 year old female with history of invasive ductal carcinoma of the right breast. She was originally diagnosed in June 2011 with a stage II breast cancer. At that time she underwent a lumpectomy followed by chemotherapy given adjuvantly consisting of Adriamycin and Taxotere following CALGB 16109 study. She then went on to receive 5 years of tamoxifen.  She was then diagnosed with a second cancer in the ipsilateral breast measuring 1-0.1 and 1.6 cm with LVI. It was triple negative. She underwent a mastectomy on 06/18/2009 followed by adjuvant chemotherapy consisting of Taxol and carboplatinum which she completed in December of 2010.  PRIOR THERAPY:  #1 stage II right breast cancer diagnosed 2001 status post lumpectomy followed by adjuvant chemotherapy consisting of Adriamycin and Cytoxan following CALGB 60454 study. She then received 5 years of tamoxifen.  #2 second cancer in the ipsilateral breast measuring 2.1 and 1.6 cm with LVI. ER negative PR negative HER-2/neu negative. Status post mastectomy 06/18/2009. She then received Taxol and carboplatinum from 08/16/2009 through 11/08/2009.  #3 the patient was recently seen at [facility] by her surgeon. She was noted to have some pain. She went on to have staging scans performed which revealed no evidence of disease. However on clinical exam patient apparently had a nodule in the right anterior chest wall. This has been biopsied and the pathology is consistent with a recurrent invasive ductal carcinoma that is ER positive PR positive.  #4 patient is now status post right axillary lymph node dissection for level II and 3 lymph nodes there was no evidence of malignancy in 4 lymph nodes. There was soft tissue taken called Aundria Rud node excision that showed 3.1 cm metastatic adenocarcinoma with perineural invasion apparently the tumor was ER positive and PR  positive. No lymph node tissue was identified possibly representing either an entirely replaced lymph node with extracapsular extension or a soft tissue metastasis.  #4 patient began chemotherapy consisting of CMF on 06/10/2012.  CURRENT THERAPY: S/P CMF on 06/10/2012  INTERVAL HISTORY: Breanna Lara 41 y.o. female returns for followup visit today.Overall patient is doing well she tolerated her chemotherapy reasonably well. She does tell me she developed some nausea she did take Compazine for it which helped resolve it. She had some 2 episodes of diarrhea but that is now resolved. She otherwise denies any headaches double vision blurring of vision fevers chills or night sweats no peripheral paresthesias no abdominal pain no bleeding problems. Remainder of the 10 point review of systems is negative.   MEDICAL HISTORY: Past Medical History  Diagnosis Date  . Breast cancer 2001, 2010     S/P Rt mastectomy  BRAC neg  . Ectopic pregnancy, tubal 1998  . Irritable bowel syndrome   . Hemorrhoids   . History of toe surgery   . Anemia     H/O  . Blood transfusion, without reported diagnosis   . Genital HSV   . ASCUS (atypical squamous cells of undetermined significance) on Pap smear 2009    colpo  . Type o blood, rh negative   . Anxiety   . Hx of radiation therapy 01/04/01 - 02/19/01    right breast  . History of chemotherapy     ALLERGIES:  is allergic to percocet.  MEDICATIONS:  Current Outpatient Prescriptions  Medication Sig Dispense Refill  . cholecalciferol (VITAMIN D) 1000 UNITS tablet Take 1,000 Units by mouth daily.        Marland Kitchen  dexamethasone (DECADRON) 4 MG tablet Take 2 tablets by mouth daily starting the day after chemotherapy for 2 days. Take with food.  30 tablet  1  . hydrocortisone-pramoxine (PROCTOFOAM-HC) rectal foam Place 1 applicator rectally 3 (three) times daily as needed.  10 g  1  . lidocaine-prilocaine (EMLA) cream Apply topically as needed.  30 g  6  . LORazepam  (ATIVAN) 0.5 MG tablet Take 1 tablet (0.5 mg total) by mouth every 6 (six) hours as needed (Nausea or vomiting).  30 tablet  0  . Multiple Vitamin (MULTIVITAMIN PO) Take by mouth daily.        . ondansetron (ZOFRAN) 8 MG tablet Take 1 tablet two times a day starting the day after chemo for 2 days. Then take 1 tablet two times a day as needed for nausea or vomiting.  30 tablet  1  . polyethylene glycol powder (GLYCOLAX/MIRALAX) powder Take 17 g by mouth as needed.       . prochlorperazine (COMPAZINE) 10 MG tablet Take 1 tablet (10 mg total) by mouth every 6 (six) hours as needed (Nausea or vomiting).  30 tablet  1  . prochlorperazine (COMPAZINE) 25 MG suppository Place 1 suppository (25 mg total) rectally every 12 (twelve) hours as needed for nausea.  12 suppository  3   Current Facility-Administered Medications  Medication Dose Route Frequency Provider Last Rate Last Dose  . influenza  inactive virus vaccine (FLUZONE/FLUARIX) injection 0.5 mL  0.5 mL Intramuscular Once Lesly Dukes, MD        SURGICAL HISTORY:  Past Surgical History  Procedure Date  . Mastectomy 06/18/09    RT  . Cholecystectomy   . Laparoscopy     salpingectomy  . Eye surgery   . Nasal septum surgery     deviated septum  . Breast lumpectomy 06/05/00    right, ER/PR +, HER 2-  . Axillary node dissection 04/29/12    right, 0/4 nodes pos, mass bx- met adenocarcinoma  . Chest wall biopsy 04/07/12    soft tissue, right -metastatic adenocarcinoma, ER/PR +, HER2 -    REVIEW OF SYSTEMS:  Pertinent items are noted in HPI.   PHYSICAL EXAMINATION:  General: Patient is awake alert in no acute distress. HEENT exam: EOMI PERRLA sclerae anicteric no conjunctival pallor oral mucosa is moist no thrush no mucositis neck is supple no palpable cervical supraclavicular or axillary adenopathy. Lungs: Clear to auscultation and percussion cardiovascular: Regular rate rhythm no murmurs gallops or rubs abdomen: Soft nontender nondistended  bowel sounds are present no hepatosplenomegaly extremities: No clubbing cyanosis or edema neuro: Alert oriented otherwise nonfocal. Chest wall mastectomy site looks clean there is no evidence of local recurrence.  ECOG PERFORMANCE STATUS: 0 - Asymptomatic  Blood pressure 113/81, pulse 85, temperature 98.3 F (36.8 C), temperature source Oral, height 5\' 7"  (1.702 m), weight 180 lb 9.6 oz (81.92 kg), last menstrual period 06/01/2012.  LABORATORY DATA: Lab Results  Component Value Date   WBC 7.0 06/17/2012   HGB 11.7 06/17/2012   HCT 34.9 06/17/2012   MCV 85.7 06/17/2012   PLT 259 06/17/2012      Chemistry      Component Value Date/Time   NA 137 06/10/2012 1004   K 3.9 06/10/2012 1004   CL 101 06/10/2012 1004   CO2 27 06/10/2012 1004   BUN 10 06/10/2012 1004   CREATININE 0.84 06/10/2012 1004      Component Value Date/Time   CALCIUM 9.1 06/10/2012 1004  ALKPHOS 73 06/10/2012 1004   AST 17 06/10/2012 1004   ALT 17 06/10/2012 1004   BILITOT 0.3 06/10/2012 1004       RADIOGRAPHIC STUDIES:  No results found.  ASSESSMENT: 41 year old female with:  1.  history of 2 primary breast cancers in the same breast. She has undergone a mastectomy. Patient now with recurrent breast cancer 2 possibly the right. Lymph nodes and chest wall. patient now with recurrent breast cancer at that is ER positive PR positive likely her original disease back in 2001. Patient has had a right axillary lymph node dissection and removal of what looks like a Rotters node.  #2 patient has had second opinion at [facility] by Dr. Tomi Likens one of the medical oncologists there. Her recommendation was CMF combination chemotherapy followed by an antiestrogen therapy. I do concur with this. However I do think that we should give the CMF concurrently with her radiation therapy. Patient and I discussed this today. I will plan on giving her 2 cycles of CMF up front. Then she could proceed with getting cyclophosphamide and 5-FU  concurrently with radiation therapy. Once she completes her radiation then we will continue the CMF combination. A total of 6 cycles is planned tentatively.  PLAN  #1 Overall patient has done well with her chemotherapy cycle 1 of CMF. Her interim labs look good.  #2 she will be seen back in 2 weeks' time for cycle #2 of CMF. After that she will start radiation therapy.Patient is scheduled to be seen by Dr. Chipper Herb at the end of July for simulation.  All questions were answered. The patient knows to call the clinic with any problems, questions or concerns. We can certainly see the patient much sooner if necessary.  I spent counseling the patient face to face. The total time spent in the appointment was 30 minutes.    Drue Second, MD Medical/Oncology [facility] [phone] (beeper) [phone] (Office)

## 2012-06-17 NOTE — Telephone Encounter (Signed)
Per orders from 06-17-2012 move all patients appointments to md 9:00am and 8:30am sent michelle an email to adjust patient's appointment

## 2012-06-21 ENCOUNTER — Ambulatory Visit: Payer: Medicaid Other | Admitting: Oncology

## 2012-06-28 DIAGNOSIS — Z923 Personal history of irradiation: Secondary | ICD-10-CM | POA: Insufficient documentation

## 2012-06-29 ENCOUNTER — Ambulatory Visit
Admission: RE | Admit: 2012-06-29 | Discharge: 2012-06-29 | Disposition: A | Discharge status: Home / Self Care | Payer: Medicaid Other | Source: Ambulatory Visit | Attending: Radiation Oncology | Admitting: Radiation Oncology

## 2012-06-29 ENCOUNTER — Encounter: Payer: Self-pay | Admitting: Radiation Oncology

## 2012-06-29 VITALS — BP 121/86 | HR 91 | Temp 98.4°F | Resp 20 | Wt 183.9 lb

## 2012-06-29 DIAGNOSIS — C50919 Malignant neoplasm of unspecified site of unspecified female breast: Secondary | ICD-10-CM

## 2012-06-29 NOTE — Progress Notes (Signed)
Pt's next chemotherapy is 07/01/12. To receive radiation concurrently w/chemo. Chemo is scheduled every 3 weeks. Pt states she has nausea, fatigue, "just doesn't feel good for about 5 days after chemo". Denies pain, loss of appetite.

## 2012-06-29 NOTE — Progress Notes (Signed)
Please see the Nurse Progress Note in the MD Initial Consult Encounter for this patient. 

## 2012-06-29 NOTE — Progress Notes (Signed)
Followup note:  Diagnosis: Recurrent carcinoma the right breast/regional node recurrence  History: [name] returns today for review and scheduling of her radiation therapy. Please refer to my dictated note from 05/20/2012 for details of her presentation. She sought a second opinion at [facility], and they are in agreement with concomitant chemoradiation, initially beginning with CMF and a continuing with CF during nodal irradiation. As mentioned previously, her recurrent mass (Rotter's node) appears to be at the junction of her tangential fields. She did not previously have nodal irradiation. She started CMF chemotherapy with Dr. Welton Flakes and begin cycle 2 on August 1. The plan is to follow with 2 more cycles of CF along with radiation therapy. We have not performed a CT simulation to verify her previous treatment fields to see how much the area of recurrence received from her tangential fields. [facility] and felt that it would be reasonable to treat her with twice a day radiation therapy to a dose of 3600-4000 cGy up to a volume of 125 cc in any area of overlap. I do not think the brachial plexus will be an issue. Of note is that she is being fitted for a right upper extremity sleeve for her lymphedema. She has been seen by physical therapy. I should mention that her PET scan from 05/26/2012, postop, was without evidence for persistent or recurrent disease. Her MRI scan of the thoracic spine on 05/24/2012 was without evidence for metastatic disease. The sclerotic lesions were felt to be bone islands along the inferior aspect of T11. Lastly, there was no clear evidence for metastatic disease to her adrenal glands.    Physical examination: Alert and oriented. Wt Readings from Last 3 Encounters:  06/29/12 183 lb 14.4 oz (83.416 kg)  06/17/12 180 lb 9.6 oz (81.92 kg)  06/07/12 182 lb 9.6 oz (82.827 kg)   Temp Readings from Last 3 Encounters:  06/29/12 98.4 F (36.9 C) Oral  06/17/12 98.3 F  (36.8 C) Oral  06/10/12 98 F (36.7 C) Oral   BP Readings from Last 3 Encounters:  06/29/12 121/86  06/17/12 113/81  06/10/12 126/83   Pulse Readings from Last 3 Encounters:  06/29/12 91  06/17/12 85  06/10/12 78   Nodes: There is no palpable cervical, supraclavicular, or axillary lymphadenopathy. Chest: No visible or palpable evidence for recurrent disease along her right chest wall. There is a parasternal radiation tattoo. Extremities there is mild right upper extremity lymphedema. Laboratory data: Lab Results  Component Value Date   WBC 7.0 06/17/2012   HGB 11.7 06/17/2012   HCT 34.9 06/17/2012   MCV 85.7 06/17/2012   PLT 259 06/17/2012    Impression: Recurrent carcinoma the right breast, regional node recurrence. I again discussed the potential acute and late toxicities of radiation therapy, and consent was signed today  Plan:. I'll have her return for simulation the week of August 5 and her begin her radiation therapy the week of August 15.  30 minutes was spent face-to-face with the patient, primarily counseling the patient and coordinating her care.

## 2012-07-01 ENCOUNTER — Ambulatory Visit (HOSPITAL_BASED_OUTPATIENT_CLINIC_OR_DEPARTMENT_OTHER): Payer: Medicaid Other

## 2012-07-01 ENCOUNTER — Encounter: Payer: Self-pay | Admitting: Oncology

## 2012-07-01 ENCOUNTER — Ambulatory Visit (HOSPITAL_BASED_OUTPATIENT_CLINIC_OR_DEPARTMENT_OTHER): Payer: Medicaid Other | Admitting: Oncology

## 2012-07-01 ENCOUNTER — Other Ambulatory Visit (HOSPITAL_BASED_OUTPATIENT_CLINIC_OR_DEPARTMENT_OTHER): Payer: Medicaid Other | Admitting: Lab

## 2012-07-01 ENCOUNTER — Telehealth: Payer: Self-pay | Admitting: *Deleted

## 2012-07-01 VITALS — BP 138/81 | HR 83 | Temp 98.6°F | Ht 67.0 in | Wt 184.7 lb

## 2012-07-01 DIAGNOSIS — Z170 Estrogen receptor positive status [ER+]: Secondary | ICD-10-CM

## 2012-07-01 DIAGNOSIS — C50919 Malignant neoplasm of unspecified site of unspecified female breast: Secondary | ICD-10-CM

## 2012-07-01 DIAGNOSIS — Z9010 Acquired absence of unspecified breast and nipple: Secondary | ICD-10-CM

## 2012-07-01 DIAGNOSIS — Z5111 Encounter for antineoplastic chemotherapy: Secondary | ICD-10-CM

## 2012-07-01 LAB — COMPREHENSIVE METABOLIC PANEL
Albumin: 4 g/dL (ref 3.5–5.2)
BUN: 8 mg/dL (ref 6–23)
CO2: 26 mEq/L (ref 19–32)
Calcium: 9.2 mg/dL (ref 8.4–10.5)
Chloride: 103 mEq/L (ref 96–112)
Glucose, Bld: 101 mg/dL — ABNORMAL HIGH (ref 70–99)
Potassium: 4.1 mEq/L (ref 3.5–5.3)
Sodium: 140 mEq/L (ref 135–145)
Total Protein: 6.9 g/dL (ref 6.0–8.3)

## 2012-07-01 LAB — CBC WITH DIFFERENTIAL/PLATELET
Basophils Absolute: 0.1 10*3/uL (ref 0.0–0.1)
Eosinophils Absolute: 0.3 10*3/uL (ref 0.0–0.5)
HCT: 36.6 % (ref 34.8–46.6)
HGB: 12.2 g/dL (ref 11.6–15.9)
NEUT#: 3.4 10*3/uL (ref 1.5–6.5)
NEUT%: 46.8 % (ref 38.4–76.8)
RDW: 13.7 % (ref 11.2–14.5)
lymph#: 2.6 10*3/uL (ref 0.9–3.3)

## 2012-07-01 MED ORDER — FLUOROURACIL CHEMO INJECTION 2.5 GM/50ML
600.0000 mg/m2 | Freq: Once | INTRAVENOUS | Status: AC
  Administered 2012-07-01: 1200 mg via INTRAVENOUS
  Filled 2012-07-01: qty 24

## 2012-07-01 MED ORDER — HEPARIN SOD (PORK) LOCK FLUSH 100 UNIT/ML IV SOLN
500.0000 [IU] | Freq: Once | INTRAVENOUS | Status: DC | PRN
  Filled 2012-07-01: qty 5

## 2012-07-01 MED ORDER — SODIUM CHLORIDE 0.9 % IJ SOLN
10.0000 mL | INTRAMUSCULAR | Status: DC | PRN
  Filled 2012-07-01: qty 10

## 2012-07-01 MED ORDER — METHOTREXATE SODIUM CHEMO INJECTION 25 MG/ML
40.0000 mg/m2 | Freq: Once | INTRAMUSCULAR | Status: AC
  Administered 2012-07-01: 80 mg via INTRAVENOUS
  Filled 2012-07-01: qty 3.2

## 2012-07-01 MED ORDER — DEXAMETHASONE SODIUM PHOSPHATE 10 MG/ML IJ SOLN
10.0000 mg | Freq: Once | INTRAMUSCULAR | Status: AC
  Administered 2012-07-01: 10 mg via INTRAVENOUS

## 2012-07-01 MED ORDER — ONDANSETRON 8 MG/50ML IVPB (CHCC)
8.0000 mg | Freq: Once | INTRAVENOUS | Status: AC
  Administered 2012-07-01: 8 mg via INTRAVENOUS

## 2012-07-01 MED ORDER — SODIUM CHLORIDE 0.9 % IV SOLN
600.0000 mg/m2 | Freq: Once | INTRAVENOUS | Status: AC
  Administered 2012-07-01: 1180 mg via INTRAVENOUS
  Filled 2012-07-01: qty 59

## 2012-07-01 MED ORDER — SODIUM CHLORIDE 0.9 % IV SOLN
Freq: Once | INTRAVENOUS | Status: AC
  Administered 2012-07-01: 10:00:00 via INTRAVENOUS

## 2012-07-01 NOTE — Patient Instructions (Addendum)
[facility] Discharge Instructions for Patients Receiving Chemotherapy  Today you received the following chemotherapy agents cytoxan, methotrexate, 5FU To help prevent nausea and vomiting after your treatment, we encourage you to take your nausea medication  and take it as often as prescribed  If you develop nausea and vomiting that is not controlled by your nausea medication, call the clinic. If it is after clinic hours your family physician or the after hours number for the clinic or go to the Emergency Department.   BELOW ARE SYMPTOMS THAT SHOULD BE REPORTED IMMEDIATELY:  *FEVER GREATER THAN 100.5 F  *CHILLS WITH OR WITHOUT FEVER  NAUSEA AND VOMITING THAT IS NOT CONTROLLED WITH YOUR NAUSEA MEDICATION  *UNUSUAL SHORTNESS OF BREATH  *UNUSUAL BRUISING OR BLEEDING  TENDERNESS IN MOUTH AND THROAT WITH OR WITHOUT PRESENCE OF ULCERS  *URINARY PROBLEMS  *BOWEL PROBLEMS  UNUSUAL RASH Items with * indicate a potential emergency and should be followed up as soon as possible.  One of the nurses will contact you 24 hours after your treatment. Please let the nurse know about any problems that you may have experienced. Feel free to call the clinic you have any questions or concerns. The clinic phone number is [phone].   I have been informed and understand all the instructions given to me. I know to contact the clinic, my physician, or go to the Emergency Department if any problems should occur. I do not have any questions at this time, but understand that I may call the clinic during office hours   should I have any questions or need assistance in obtaining follow up care.    __________________________________________  _____________  __________ Signature of Patient or Authorized Representative            Date                   Time    __________________________________________ Nurse's Signature

## 2012-07-01 NOTE — Telephone Encounter (Signed)
Nothing to be scheduled

## 2012-07-01 NOTE — Patient Instructions (Addendum)
Proceed with chemotherapy today. Return in 1 week for follow up and labs  Call with any problems

## 2012-07-01 NOTE — Progress Notes (Signed)
OFFICE PROGRESS NOTE    No primary provider on file. No primary provider on file.  DIAGNOSIS: 41 year old female with history of invasive ductal carcinoma of the right breast. She was originally diagnosed in June 2011 with a stage II breast cancer. At that time she underwent a lumpectomy followed by chemotherapy given adjuvantly consisting of Adriamycin and Taxotere following CALGB 09811 study. She then went on to receive 5 years of tamoxifen.  She was then diagnosed with a second cancer in the ipsilateral breast measuring 1-0.1 and 1.6 cm with LVI. It was triple negative. She underwent a mastectomy on 06/18/2009 followed by adjuvant chemotherapy consisting of Taxol and carboplatinum which she completed in December of 2010.  PRIOR THERAPY:  #1 stage II right breast cancer diagnosed 2001 status post lumpectomy followed by adjuvant chemotherapy consisting of Adriamycin and Cytoxan following CALGB 91478 study. She then received 5 years of tamoxifen.  #2 second cancer in the ipsilateral breast measuring 2.1 and 1.6 cm with LVI. ER negative PR negative HER-2/neu negative. Status post mastectomy 06/18/2009. She then received Taxol and carboplatinum from 08/16/2009 through 11/08/2009.  #3 the patient was recently seen at [facility] by her surgeon. She was noted to have some pain. She went on to have staging scans performed which revealed no evidence of disease. However on clinical exam patient apparently had a nodule in the right anterior chest wall. This has been biopsied and the pathology is consistent with a recurrent invasive ductal carcinoma that is ER positive PR positive.  #4 patient is now status post right axillary lymph node dissection for level II and 3 lymph nodes there was no evidence of malignancy in 4 lymph nodes. There was soft tissue taken called Aundria Rud node excision that showed 3.1 cm metastatic adenocarcinoma with perineural invasion apparently the tumor was ER positive and PR  positive. No lymph node tissue was identified possibly representing either an entirely replaced lymph node with extracapsular extension or a soft tissue metastasis.  #4 patient began chemotherapy consisting of CMF on 06/10/2012.  CURRENT THERAPY: Cycle 2 CMF Today  INTERVAL HISTORY: Breanna Lara 41 y.o. female returns for followup visit today.Overall patient is doing well. She presents today for cycle #2 of CMF. She has been seen by Dr. Chipper Herb for radiation consultation. He is planning on doing a simulation next week with plans of getting these radiation started around August 15. Clinically patient has no complaints she denies any fevers chills night sweats headaches shortness of breath chest pains palpitations no myalgias or arthralgias. She does take Compazine for anti-emetics. This is helping her considerably. Remainder of the 10 point review of systems is negative.   MEDICAL HISTORY: Past Medical History  Diagnosis Date  . Breast cancer 2001, 2010     S/P Rt mastectomy  BRAC neg  . Ectopic pregnancy, tubal 1998  . Irritable bowel syndrome   . Hemorrhoids   . History of toe surgery   . Anemia     H/O  . Blood transfusion, without reported diagnosis   . Genital HSV   . ASCUS (atypical squamous cells of undetermined significance) on Pap smear 2009    colpo  . Type o blood, rh negative   . Anxiety   . Hx of radiation therapy 01/04/01 - 02/19/01    right breast  . History of chemotherapy     ALLERGIES:  is allergic to percocet.  MEDICATIONS:  Current Outpatient Prescriptions  Medication Sig Dispense Refill  . cholecalciferol (VITAMIN  D) 1000 UNITS tablet Take 1,000 Units by mouth daily.        Marland Kitchen dexamethasone (DECADRON) 4 MG tablet Take 2 tablets by mouth daily starting the day after chemotherapy for 2 days. Take with food.  30 tablet  1  . hydrocortisone-pramoxine (PROCTOFOAM-HC) rectal foam Place 1 applicator rectally 3 (three) times daily as needed.  10 g  1  .  lidocaine-prilocaine (EMLA) cream Apply topically as needed.  30 g  6  . LORazepam (ATIVAN) 0.5 MG tablet Take 1 tablet (0.5 mg total) by mouth every 6 (six) hours as needed (Nausea or vomiting).  30 tablet  0  . Multiple Vitamin (MULTIVITAMIN PO) Take by mouth daily.        . ondansetron (ZOFRAN) 8 MG tablet Take 1 tablet two times a day starting the day after chemo for 2 days. Then take 1 tablet two times a day as needed for nausea or vomiting.  30 tablet  1  . polyethylene glycol powder (GLYCOLAX/MIRALAX) powder Take 17 g by mouth as needed.       . prochlorperazine (COMPAZINE) 10 MG tablet Take 1 tablet (10 mg total) by mouth every 6 (six) hours as needed (Nausea or vomiting).  30 tablet  1  . prochlorperazine (COMPAZINE) 25 MG suppository Place 1 suppository (25 mg total) rectally every 12 (twelve) hours as needed for nausea.  12 suppository  3   Current Facility-Administered Medications  Medication Dose Route Frequency Provider Last Rate Last Dose  . influenza  inactive virus vaccine (FLUZONE/FLUARIX) injection 0.5 mL  0.5 mL Intramuscular Once Lesly Dukes, MD        SURGICAL HISTORY:  Past Surgical History  Procedure Date  . Mastectomy 06/18/09    RT  . Cholecystectomy   . Laparoscopy     salpingectomy  . Eye surgery   . Nasal septum surgery     deviated septum  . Breast lumpectomy 06/05/00    right, ER/PR +, HER 2-  . Axillary node dissection 04/29/12    right, 0/4 nodes pos, mass bx- met adenocarcinoma  . Chest wall biopsy 04/07/12    soft tissue, right -metastatic adenocarcinoma, ER/PR +, HER2 -    REVIEW OF SYSTEMS:  Pertinent items are noted in HPI.   PHYSICAL EXAMINATION:  General: Patient is awake alert in no acute distress. HEENT exam: EOMI PERRLA sclerae anicteric no conjunctival pallor oral mucosa is moist no thrush no mucositis neck is supple no palpable cervical supraclavicular or axillary adenopathy. Lungs: Clear to auscultation and percussion cardiovascular:  Regular rate rhythm no murmurs gallops or rubs abdomen: Soft nontender nondistended bowel sounds are present no hepatosplenomegaly extremities: No clubbing cyanosis or edema neuro: Alert oriented otherwise nonfocal. Chest wall mastectomy site looks clean there is no evidence of local recurrence.  ECOG PERFORMANCE STATUS: 0 - Asymptomatic  Blood pressure 138/81, pulse 83, temperature 98.6 F (37 C), temperature source Oral, height 5\' 7"  (1.702 m), weight 184 lb 11.2 oz (83.779 kg).  LABORATORY DATA: Lab Results  Component Value Date   WBC 7.2 07/01/2012   HGB 12.2 07/01/2012   HCT 36.6 07/01/2012   MCV 85.1 07/01/2012   PLT 313 07/01/2012      Chemistry      Component Value Date/Time   NA 140 06/17/2012 1218   K 4.4 06/17/2012 1218   CL 102 06/17/2012 1218   CO2 32 06/17/2012 1218   BUN 11 06/17/2012 1218   CREATININE 0.98 06/17/2012 1218  Component Value Date/Time   CALCIUM 9.3 06/17/2012 1218   ALKPHOS 58 06/17/2012 1218   AST 26 06/17/2012 1218   ALT 27 06/17/2012 1218   BILITOT 0.4 06/17/2012 1218       RADIOGRAPHIC STUDIES:  No results found.  ASSESSMENT: 41 year old female with:  1.  history of 2 primary breast cancers in the same breast. She has undergone a mastectomy. Patient now with recurrent breast cancer 2 possibly the right. Lymph nodes and chest wall. patient now with recurrent breast cancer at that is ER positive PR positive likely her original disease back in 2001. Patient has had a right axillary lymph node dissection and removal of what looks like a Rotters node.  #2 patient has had second opinion at [facility] by Dr. Tomi Likens one of the medical oncologists there. Her recommendation was CMF combination chemotherapy followed by an antiestrogen therapy. I do concur with this. However I do think that we should give the CMF concurrently with her radiation therapy. Patient and I discussed this today. I will plan on giving her 2 cycles of CMF up front. Then she could  proceed with getting cyclophosphamide and 5-FU concurrently with radiation therapy. Once she completes her radiation then we will continue the CMF combination. A total of 6 cycles is planned tentatively.  PLAN  #1 Patient will proceed with cycle #2 of CMF. And she will return in one week's time for interim labs.  #2 patient has been seen by Dr. Dayton Scrape and simulation is planned next Tuesday. With the final plan on starting radiation on August 15. At that time I will plan on a limited in methotrexate From her chemotherapy regimen for duration of radiation.Patient understands the rationale for doing this.  #3 she knows to call me with any problems. She has the appropriate anti-emetics to start taking after her chemotherapy today.  All questions were answered. The patient knows to call the clinic with any problems, questions or concerns. We can certainly see the patient much sooner if necessary.  I spent counseling the patient face to face. The total time spent in the appointment was 30 minutes.    Drue Second, MD Medical/Oncology [facility] [phone] (beeper) [phone] (Office)

## 2012-07-02 ENCOUNTER — Telehealth: Payer: Self-pay | Admitting: Medical Oncology

## 2012-07-02 NOTE — Telephone Encounter (Signed)
Spoke with patient, per MD, patient to push fluids, rest, continue checking temperatures, anit-emetics, Tylenol for fever.  Return call to clinic if symptoms worsen.  Patient expressed understanding, no further questions at this time.

## 2012-07-02 NOTE — Telephone Encounter (Signed)
States now she has developed a rash " splotches all over " on her face, , neck , torso and legs-no itching. I instructed pt to take benadryl 50 mg po now and to go to ED if she starts experiencing SOB, worsening hives or other allergic symptoms. Pt voices understanding.

## 2012-07-02 NOTE — Addendum Note (Signed)
Encounter addended by: Mary Clare Chesson, RN on: 07/02/2012  9:27 AM<BR>     Documentation filed: Charges VN

## 2012-07-02 NOTE — Telephone Encounter (Signed)
Pt calling to report temp of 100,8 at 8 am today , persistant frontal headache that started last night about 10 pm , , nausea , vomited x 1 -taking compazine every 6 hours. Cycle 2 of CMF yesterday.  I will consult with Dr Welton Flakes

## 2012-07-06 ENCOUNTER — Ambulatory Visit (HOSPITAL_BASED_OUTPATIENT_CLINIC_OR_DEPARTMENT_OTHER): Payer: Medicaid Other | Admitting: Oncology

## 2012-07-06 ENCOUNTER — Other Ambulatory Visit (HOSPITAL_BASED_OUTPATIENT_CLINIC_OR_DEPARTMENT_OTHER): Payer: Medicaid Other | Admitting: Lab

## 2012-07-06 ENCOUNTER — Ambulatory Visit
Admission: RE | Admit: 2012-07-06 | Discharge: 2012-07-06 | Disposition: A | Discharge status: Home / Self Care | Payer: Medicaid Other | Source: Ambulatory Visit | Attending: Radiation Oncology | Admitting: Radiation Oncology

## 2012-07-06 ENCOUNTER — Encounter: Payer: Self-pay | Admitting: Oncology

## 2012-07-06 VITALS — BP 118/82 | HR 77 | Temp 97.6°F | Resp 20 | Ht 67.0 in | Wt 184.6 lb

## 2012-07-06 DIAGNOSIS — C50919 Malignant neoplasm of unspecified site of unspecified female breast: Secondary | ICD-10-CM

## 2012-07-06 DIAGNOSIS — C44599 Other specified malignant neoplasm of skin of other part of trunk: Secondary | ICD-10-CM

## 2012-07-06 DIAGNOSIS — R21 Rash and other nonspecific skin eruption: Secondary | ICD-10-CM

## 2012-07-06 DIAGNOSIS — Z853 Personal history of malignant neoplasm of breast: Secondary | ICD-10-CM

## 2012-07-06 LAB — COMPREHENSIVE METABOLIC PANEL
AST: 14 U/L (ref 0–37)
Albumin: 3.9 g/dL (ref 3.5–5.2)
BUN: 15 mg/dL (ref 6–23)
Calcium: 8.9 mg/dL (ref 8.4–10.5)
Chloride: 104 mEq/L (ref 96–112)
Creatinine, Ser: 0.9 mg/dL (ref 0.50–1.10)
Glucose, Bld: 83 mg/dL (ref 70–99)
Potassium: 3.7 mEq/L (ref 3.5–5.3)

## 2012-07-06 LAB — CBC WITH DIFFERENTIAL/PLATELET
Basophils Absolute: 0.1 10*3/uL (ref 0.0–0.1)
EOS%: 4.7 % (ref 0.0–7.0)
Eosinophils Absolute: 0.4 10*3/uL (ref 0.0–0.5)
HCT: 36.1 % (ref 34.8–46.6)
HGB: 12.2 g/dL (ref 11.6–15.9)
MCH: 29 pg (ref 25.1–34.0)
MCV: 85.5 fL (ref 79.5–101.0)
NEUT#: 5.6 10*3/uL (ref 1.5–6.5)
NEUT%: 68.7 % (ref 38.4–76.8)
RDW: 13.5 % (ref 11.2–14.5)
lymph#: 2 10*3/uL (ref 0.9–3.3)

## 2012-07-06 NOTE — Patient Instructions (Addendum)
Doing well  I will see you in 2 weeks for next cycle of chemotherapy

## 2012-07-06 NOTE — Progress Notes (Signed)
Simulation/treatment planning note: The patient was taken to the CT simulator. She was placed on a breast board with a custom neck mold for immobilization. Her sternal tattoo was marked. Other Tattoos were absent secondary to having mastectomy. Her previous simulation films were reviewed. Her tumor bed along the pectoral muscles was outlined. Brachial plexus was also outlined. She was setup AP and PA. I overlap her previous treatment fields by a few centimeters. I'm prescribing 2700 cGy in 15 sessions. I will then move off her previously irradiated tangential field to deliver a further 2340 cGy in 13 sessions. An isodose plan is requested. I will attempt to reconstruct her previous tangential fields based on her previous records, and perhaps fused her outside diagnostic CT scan which is apparently negative for adenopathy. We may be able to determine which clips were old for reference. This represents a special treatment procedure because of the overlap with her previously irradiated volume. Dosimetry is requested.

## 2012-07-06 NOTE — Progress Notes (Signed)
Met with patient to discuss RO billing.  Dx: 174.9 Female Breast, NOS (excludes Skin of breast T-173.5)  Attending Rad: Dr. Chinita Greenland Tx: [id number] Extrl Beam

## 2012-07-06 NOTE — Progress Notes (Signed)
OFFICE PROGRESS NOTE    No primary provider on file. No primary provider on file.  DIAGNOSIS: 41 year old female with history of invasive ductal carcinoma of the right breast. She was originally diagnosed in June 2011 with a stage II breast cancer. At that time she underwent a lumpectomy followed by chemotherapy given adjuvantly consisting of Adriamycin and Taxotere following CALGB 82956 study. She then went on to receive 5 years of tamoxifen.  She was then diagnosed with a second cancer in the ipsilateral breast measuring 1-0.1 and 1.6 cm with LVI. It was triple negative. She underwent a mastectomy on 06/18/2009 followed by adjuvant chemotherapy consisting of Taxol and carboplatinum which she completed in December of 2010.  PRIOR THERAPY:  #1 stage II right breast cancer diagnosed 2001 status post lumpectomy followed by adjuvant chemotherapy consisting of Adriamycin and Cytoxan following CALGB 21308 study. She then received 5 years of tamoxifen.  #2 second cancer in the ipsilateral breast measuring 2.1 and 1.6 cm with LVI. ER negative PR negative HER-2/neu negative. Status post mastectomy 06/18/2009. She then received Taxol and carboplatinum from 08/16/2009 through 11/08/2009.  #3 the patient was recently seen at [facility] by her surgeon. She was noted to have some pain. She went on to have staging scans performed which revealed no evidence of disease. However on clinical exam patient apparently had a nodule in the right anterior chest wall. This has been biopsied and the pathology is consistent with a recurrent invasive ductal carcinoma that is ER positive PR positive.  #4 patient is now status post right axillary lymph node dissection for level II and 3 lymph nodes there was no evidence of malignancy in 4 lymph nodes. There was soft tissue taken called Aundria Rud node excision that showed 3.1 cm metastatic adenocarcinoma with perineural invasion apparently the tumor was ER positive and PR  positive. No lymph node tissue was identified possibly representing either an entirely replaced lymph node with extracapsular extension or a soft tissue metastasis.  #4 patient began chemotherapy consisting of CMF on 06/10/2012.  CURRENT THERAPY: Status post Cycle 2 CMF   INTERVAL HISTORY: Breanna Lara 41 y.o. female returns for followup visit today.Overall she is doing well she tolerated the cycle 2 of CMF quite well. The evening of her chemotherapy she did develop some rashes all over her body with a temperature of her 100.8. She was instructed to take Benadryl and Tylenol and everything has resolved she has no evidence of any residual rash. The rash lasted only 24 hours. It is of unclear etiology. She has some nausea associated with it and 1 episode of vomiting no diarrhea. She denies any fevers chills night sweats headaches shortness of breath chest pains palpitations today. She has no bleeding. Patient was seen by radiation oncology today and she has had her initial simulation performed. She is set to start radiation on August 14. Remainder of the 10 point review of systems is negative.   MEDICAL HISTORY: Past Medical History  Diagnosis Date  . Breast cancer 2001, 2010     S/P Rt mastectomy  BRAC neg  . Ectopic pregnancy, tubal 1998  . Irritable bowel syndrome   . Hemorrhoids   . History of toe surgery   . Anemia     H/O  . Blood transfusion, without reported diagnosis   . Genital HSV   . ASCUS (atypical squamous cells of undetermined significance) on Pap smear 2009    colpo  . Type o blood, rh negative   .  Anxiety   . Hx of radiation therapy 01/04/01 - 02/19/01    right breast  . History of chemotherapy     ALLERGIES:  is allergic to percocet.  MEDICATIONS:  Current Outpatient Prescriptions  Medication Sig Dispense Refill  . cholecalciferol (VITAMIN D) 1000 UNITS tablet Take 1,000 Units by mouth daily.        Marland Kitchen dexamethasone (DECADRON) 4 MG tablet Take 2 tablets by mouth  daily starting the day after chemotherapy for 2 days. Take with food.  30 tablet  1  . hydrocortisone-pramoxine (PROCTOFOAM-HC) rectal foam Place 1 applicator rectally 3 (three) times daily as needed.  10 g  1  . lidocaine-prilocaine (EMLA) cream Apply topically as needed.  30 g  6  . LORazepam (ATIVAN) 0.5 MG tablet Take 1 tablet (0.5 mg total) by mouth every 6 (six) hours as needed (Nausea or vomiting).  30 tablet  0  . Multiple Vitamin (MULTIVITAMIN PO) Take by mouth daily.        . ondansetron (ZOFRAN) 8 MG tablet Take 1 tablet two times a day starting the day after chemo for 2 days. Then take 1 tablet two times a day as needed for nausea or vomiting.  30 tablet  1  . polyethylene glycol powder (GLYCOLAX/MIRALAX) powder Take 17 g by mouth as needed.       . prochlorperazine (COMPAZINE) 10 MG tablet Take 1 tablet (10 mg total) by mouth every 6 (six) hours as needed (Nausea or vomiting).  30 tablet  1  . prochlorperazine (COMPAZINE) 25 MG suppository Place 1 suppository (25 mg total) rectally every 12 (twelve) hours as needed for nausea.  12 suppository  3    SURGICAL HISTORY:  Past Surgical History  Procedure Date  . Mastectomy 06/18/09    RT  . Cholecystectomy   . Laparoscopy     salpingectomy  . Eye surgery   . Nasal septum surgery     deviated septum  . Breast lumpectomy 06/05/00    right, ER/PR +, HER 2-  . Axillary node dissection 04/29/12    right, 0/4 nodes pos, mass bx- met adenocarcinoma  . Chest wall biopsy 04/07/12    soft tissue, right -metastatic adenocarcinoma, ER/PR +, HER2 -    REVIEW OF SYSTEMS:  Pertinent items are noted in HPI.   PHYSICAL EXAMINATION:  General: Patient is awake alert in no acute distress. HEENT exam: EOMI PERRLA sclerae anicteric no conjunctival pallor oral mucosa is moist no thrush no mucositis neck is supple no palpable cervical supraclavicular or axillary adenopathy. Lungs: Clear to auscultation and percussion cardiovascular: Regular rate rhythm no  murmurs gallops or rubs abdomen: Soft nontender nondistended bowel sounds are present no hepatosplenomegaly extremities: No clubbing cyanosis or edema neuro: Alert oriented otherwise nonfocal. Chest wall mastectomy site looks clean there is no evidence of local recurrence.  ECOG PERFORMANCE STATUS: 0 - Asymptomatic  Blood pressure 118/82, pulse 77, temperature 97.6 F (36.4 C), resp. rate 20, height 5\' 7"  (1.702 m), weight 184 lb 9.6 oz (83.734 kg).  LABORATORY DATA: Lab Results  Component Value Date   WBC 8.1 07/06/2012   HGB 12.2 07/06/2012   HCT 36.1 07/06/2012   MCV 85.5 07/06/2012   PLT 194 07/06/2012      Chemistry      Component Value Date/Time   NA 140 07/01/2012 0901   K 4.1 07/01/2012 0901   CL 103 07/01/2012 0901   CO2 26 07/01/2012 0901   BUN 8 07/01/2012 0901  CREATININE 0.87 07/01/2012 0901      Component Value Date/Time   CALCIUM 9.2 07/01/2012 0901   ALKPHOS 76 07/01/2012 0901   AST 32 07/01/2012 0901   ALT 40* 07/01/2012 0901   BILITOT 0.3 07/01/2012 0901       RADIOGRAPHIC STUDIES:  No results found.  ASSESSMENT: 41 year old female with:  1.  history of 2 primary breast cancers in the same breast. She has undergone a mastectomy. Patient now with recurrent breast cancer 2 possibly the right. Lymph nodes and chest wall. patient now with recurrent breast cancer at that is ER positive PR positive likely her original disease back in 2001. Patient has had a right axillary lymph node dissection and removal of what looks like a Rotters node.  #2 patient has had second opinion at [facility] by Dr. Tomi Likens one of the medical oncologists there. Her recommendation was CMF combination chemotherapy followed by an antiestrogen therapy. I do concur with this. However I do think that we should give the CMF concurrently with her radiation therapy. Patient and I discussed this today. I will plan on giving her 2 cycles of CMF up front. Then she could proceed with getting cyclophosphamide and  5-FU concurrently with radiation therapy. Once she completes her radiation then we will continue the CMF combination. A total of 6 cycles is planned tentatively.  PLAN  #1Patient will return in 2 weeks' time for cycle #3 of combination chemotherapy. She will receive cyclophosphamide and 5-FU only since she has started radiation therapy. We will hold the methotrexate until she completes all of her radiation.  #2 overall patient is doing well. Rash remains of unclear etiology I will continue to monitor her carefully.  All questions were answered. The patient knows to call the clinic with any problems, questions or concerns. We can certainly see the patient much sooner if necessary.  I spent counseling the patient face to face. The total time spent in the appointment was 30 minutes.    Drue Second, MD Medical/Oncology [facility] [phone] (beeper) [phone] (Office)

## 2012-07-07 ENCOUNTER — Encounter: Payer: Self-pay | Admitting: Radiation Oncology

## 2012-07-07 NOTE — Progress Notes (Signed)
Simulation/treatment planning: We obtain her outside [facility] preoperative CT scans and fused them to our treatment planning CT scan. We contoured the "old clips" for reference for her reduced field.

## 2012-07-07 NOTE — Progress Notes (Signed)
Special treatment procedure note: The patient had reconstruction of her previous tangential field radiation therapy placed on her current CT data set/isodose plan. This was following fusion of her outside CT scan to localize her previous clips (done preoperatively). The axillary nerve/brachial plexus may receive a maximum dose of approximately 4853 cGy, and thus I will limit her boost to 1800 cGy in 10 sessions. I will move off the field laterally and inferiorly and stability cover the major portion of her tumor bed. The patient understands the increased risk for irradiation in this setting.

## 2012-07-14 ENCOUNTER — Ambulatory Visit
Admission: RE | Admit: 2012-07-14 | Discharge: 2012-07-14 | Disposition: A | Discharge status: Home / Self Care | Payer: Medicaid Other | Source: Ambulatory Visit | Attending: Radiation Oncology | Admitting: Radiation Oncology

## 2012-07-15 ENCOUNTER — Ambulatory Visit
Admission: RE | Admit: 2012-07-15 | Discharge: 2012-07-15 | Disposition: A | Discharge status: Home / Self Care | Payer: Medicaid Other | Source: Ambulatory Visit | Attending: Radiation Oncology | Admitting: Radiation Oncology

## 2012-07-15 DIAGNOSIS — C50919 Malignant neoplasm of unspecified site of unspecified female breast: Secondary | ICD-10-CM

## 2012-07-15 MED ORDER — RADIAPLEXRX EX GEL
Freq: Once | CUTANEOUS | Status: AC
  Administered 2012-07-15: 16:00:00 via TOPICAL

## 2012-07-15 MED ORDER — ALRA NON-METALLIC DEODORANT (RAD-ONC)
1.0000 "application " | Freq: Once | TOPICAL | Status: AC
  Administered 2012-07-15: 1 via TOPICAL

## 2012-07-15 NOTE — Progress Notes (Signed)
Post sim ed completed w/pt; gave her "Radiation and You" booklet w/pertinent pages marked. Gave pt Radiaplex, Alra w/instructions for proper use. All questions answered. Pt will discuss her treatment area w/dr Monday; she is questioning the upper area of tx, stating she "didn't think her tumor was that high".

## 2012-07-15 NOTE — Addendum Note (Signed)
Encounter addended by: Glennie Hawk, RN on: 07/15/2012  4:07 PM<BR>     Documentation filed: Inpatient MAR, Orders

## 2012-07-16 ENCOUNTER — Ambulatory Visit
Admission: RE | Admit: 2012-07-16 | Discharge: 2012-07-16 | Disposition: A | Discharge status: Home / Self Care | Payer: Medicaid Other | Source: Ambulatory Visit | Attending: Radiation Oncology | Admitting: Radiation Oncology

## 2012-07-19 ENCOUNTER — Ambulatory Visit
Admission: RE | Admit: 2012-07-19 | Discharge: 2012-07-19 | Disposition: A | Discharge status: Home / Self Care | Payer: Medicaid Other | Source: Ambulatory Visit | Attending: Radiation Oncology | Admitting: Radiation Oncology

## 2012-07-19 ENCOUNTER — Encounter: Payer: Self-pay | Admitting: Radiation Oncology

## 2012-07-19 VITALS — BP 120/80 | HR 93 | Resp 18 | Wt 183.9 lb

## 2012-07-19 DIAGNOSIS — C50919 Malignant neoplasm of unspecified site of unspecified female breast: Secondary | ICD-10-CM

## 2012-07-19 NOTE — Progress Notes (Signed)
Weekly Management Note:  Site: Right axilla/clavicular region Current Dose:  540  cGy Projected Dose: 4500  cGy  Narrative: The patient is seen today for routine under treatment assessment. CBCT/MVCT images/port films were reviewed. The chart was reviewed.   She is without complaints today. She was given Radioplex gel to use when necessary.  Physical Examination:  Filed Vitals:   07/19/12 1321  BP: 120/80  Pulse: 93  Resp: 18  .  Weight: 183 lb 14.4 oz (83.416 kg). No significant skin changes.  Impression: Tolerating radiation therapy well.  Plan: Continue radiation therapy as planned.

## 2012-07-19 NOTE — Progress Notes (Signed)
Patient presents to the clinic today unaccompanied for PUT with Dr. Dayton Scrape. Patient is alert and oriented to person, place, and time. No distress noted. Steady gait noted. Pleasant affect noted. Patient denies pain at this time. Patient denies pain in her right axilla. Patient denies skin changes to treatment area. Patient reports using Radiaplex on treatment area as directed. Patient has no complaints. Reported all findings to Dr. Dayton Scrape.

## 2012-07-20 ENCOUNTER — Ambulatory Visit
Admission: RE | Admit: 2012-07-20 | Discharge: 2012-07-20 | Disposition: A | Discharge status: Home / Self Care | Payer: Medicaid Other | Source: Ambulatory Visit | Attending: Radiation Oncology | Admitting: Radiation Oncology

## 2012-07-21 ENCOUNTER — Ambulatory Visit
Admission: RE | Admit: 2012-07-21 | Discharge: 2012-07-21 | Disposition: A | Discharge status: Home / Self Care | Payer: Medicaid Other | Source: Ambulatory Visit | Attending: Radiation Oncology | Admitting: Radiation Oncology

## 2012-07-22 ENCOUNTER — Ambulatory Visit (HOSPITAL_BASED_OUTPATIENT_CLINIC_OR_DEPARTMENT_OTHER): Payer: Medicaid Other | Admitting: Oncology

## 2012-07-22 ENCOUNTER — Encounter: Payer: Self-pay | Admitting: Oncology

## 2012-07-22 ENCOUNTER — Other Ambulatory Visit (HOSPITAL_BASED_OUTPATIENT_CLINIC_OR_DEPARTMENT_OTHER): Payer: Medicaid Other | Admitting: Lab

## 2012-07-22 ENCOUNTER — Telehealth: Payer: Self-pay | Admitting: *Deleted

## 2012-07-22 ENCOUNTER — Ambulatory Visit
Admission: RE | Admit: 2012-07-22 | Discharge: 2012-07-22 | Disposition: A | Discharge status: Home / Self Care | Payer: Medicaid Other | Source: Ambulatory Visit | Attending: Radiation Oncology | Admitting: Radiation Oncology

## 2012-07-22 ENCOUNTER — Ambulatory Visit (HOSPITAL_BASED_OUTPATIENT_CLINIC_OR_DEPARTMENT_OTHER): Payer: Medicaid Other

## 2012-07-22 VITALS — BP 112/76 | HR 78 | Temp 98.8°F | Resp 20 | Ht 67.0 in | Wt 186.1 lb

## 2012-07-22 DIAGNOSIS — Z170 Estrogen receptor positive status [ER+]: Secondary | ICD-10-CM

## 2012-07-22 DIAGNOSIS — Z5111 Encounter for antineoplastic chemotherapy: Secondary | ICD-10-CM

## 2012-07-22 DIAGNOSIS — C44599 Other specified malignant neoplasm of skin of other part of trunk: Secondary | ICD-10-CM

## 2012-07-22 DIAGNOSIS — C50919 Malignant neoplasm of unspecified site of unspecified female breast: Secondary | ICD-10-CM

## 2012-07-22 DIAGNOSIS — Z853 Personal history of malignant neoplasm of breast: Secondary | ICD-10-CM

## 2012-07-22 LAB — COMPREHENSIVE METABOLIC PANEL
Albumin: 3.9 g/dL (ref 3.5–5.2)
Alkaline Phosphatase: 66 U/L (ref 39–117)
BUN: 7 mg/dL (ref 6–23)
CO2: 27 mEq/L (ref 19–32)
Calcium: 9.1 mg/dL (ref 8.4–10.5)
Chloride: 104 mEq/L (ref 96–112)
Glucose, Bld: 89 mg/dL (ref 70–99)
Potassium: 4 mEq/L (ref 3.5–5.3)
Sodium: 138 mEq/L (ref 135–145)
Total Protein: 6.9 g/dL (ref 6.0–8.3)

## 2012-07-22 LAB — CBC WITH DIFFERENTIAL/PLATELET
Basophils Absolute: 0.1 10*3/uL (ref 0.0–0.1)
EOS%: 4.5 % (ref 0.0–7.0)
Eosinophils Absolute: 0.2 10*3/uL (ref 0.0–0.5)
HCT: 35.5 % (ref 34.8–46.6)
HGB: 11.7 g/dL (ref 11.6–15.9)
MONO#: 0.7 10*3/uL (ref 0.1–0.9)
NEUT#: 2.4 10*3/uL (ref 1.5–6.5)
NEUT%: 44.2 % (ref 38.4–76.8)
RDW: 14.3 % (ref 11.2–14.5)
WBC: 5.4 10*3/uL (ref 3.9–10.3)
lymph#: 1.9 10*3/uL (ref 0.9–3.3)

## 2012-07-22 MED ORDER — SODIUM CHLORIDE 0.9 % IJ SOLN
10.0000 mL | INTRAMUSCULAR | Status: DC | PRN
  Administered 2012-07-22: 10 mL
  Filled 2012-07-22: qty 10

## 2012-07-22 MED ORDER — DEXAMETHASONE SODIUM PHOSPHATE 10 MG/ML IJ SOLN
10.0000 mg | Freq: Once | INTRAMUSCULAR | Status: AC
  Administered 2012-07-22: 10 mg via INTRAVENOUS

## 2012-07-22 MED ORDER — FLUOROURACIL CHEMO INJECTION 2.5 GM/50ML
600.0000 mg/m2 | Freq: Once | INTRAVENOUS | Status: AC
  Administered 2012-07-22: 1200 mg via INTRAVENOUS
  Filled 2012-07-22: qty 24

## 2012-07-22 MED ORDER — SODIUM CHLORIDE 0.9 % IV SOLN
Freq: Once | INTRAVENOUS | Status: AC
Start: 2012-07-22 — End: 2012-07-22
  Administered 2012-07-22: 11:00:00 via INTRAVENOUS

## 2012-07-22 MED ORDER — ONDANSETRON 8 MG/50ML IVPB (CHCC)
8.0000 mg | Freq: Once | INTRAVENOUS | Status: AC
  Administered 2012-07-22: 8 mg via INTRAVENOUS

## 2012-07-22 MED ORDER — SODIUM CHLORIDE 0.9 % IV SOLN
600.0000 mg/m2 | Freq: Once | INTRAVENOUS | Status: AC
  Administered 2012-07-22: 1180 mg via INTRAVENOUS
  Filled 2012-07-22: qty 59

## 2012-07-22 MED ORDER — HEPARIN SOD (PORK) LOCK FLUSH 100 UNIT/ML IV SOLN
500.0000 [IU] | Freq: Once | INTRAVENOUS | Status: AC | PRN
  Administered 2012-07-22: 500 [IU]
  Filled 2012-07-22: qty 5

## 2012-07-22 NOTE — Patient Instructions (Addendum)
Return as scheduled  Proceed with chemotherapy  today

## 2012-07-22 NOTE — Progress Notes (Signed)
OFFICE PROGRESS NOTE    No primary provider on file. No primary provider on file.  DIAGNOSIS: 41 year old female with history of invasive ductal carcinoma of the right breast. She was originally diagnosed in June 2011 with a stage II breast cancer. At that time she underwent a lumpectomy followed by chemotherapy given adjuvantly consisting of Adriamycin and Taxotere following CALGB 78295 study. She then went on to receive 5 years of tamoxifen.  She was then diagnosed with a second cancer in the ipsilateral breast measuring 1-0.1 and 1.6 cm with LVI. It was triple negative. She underwent a mastectomy on 06/18/2009 followed by adjuvant chemotherapy consisting of Taxol and carboplatinum which she completed in December of 2010.  PRIOR THERAPY:  #1 stage II right breast cancer diagnosed 2001 status post lumpectomy followed by adjuvant chemotherapy consisting of Adriamycin and Cytoxan following CALGB 62130 study. She then received 5 years of tamoxifen.  #2 second cancer in the ipsilateral breast measuring 2.1 and 1.6 cm with LVI. ER negative PR negative HER-2/neu negative. Status post mastectomy 06/18/2009. She then received Taxol and carboplatinum from 08/16/2009 through 11/08/2009.  #3 the patient was recently seen at [facility] by her surgeon. She was noted to have some pain. She went on to have staging scans performed which revealed no evidence of disease. However on clinical exam patient apparently had a nodule in the right anterior chest wall. This has been biopsied and the pathology is consistent with a recurrent invasive ductal carcinoma that is ER positive PR positive.  #4 patient is now status post right axillary lymph node dissection for level II and 3 lymph nodes there was no evidence of malignancy in 4 lymph nodes. There was soft tissue taken called Aundria Rud node excision that showed 3.1 cm metastatic adenocarcinoma with perineural invasion apparently the tumor was ER positive and PR  positive. No lymph node tissue was identified possibly representing either an entirely replaced lymph node with extracapsular extension or a soft tissue metastasis.  #5 patient began chemotherapy consisting of CMF on 06/10/2012.  #6 Patient is now receiving radiation therapy. Concurrently with radiation she will receive Cytoxan and 5-FU.  CURRENT THERAPY: Concurrent chemoradiation with chemotherapy consisting of Cytoxan and 5-FU. Patient is here for cycle #3 of her chemotherapy regimen.  INTERVAL HISTORY: Breanna Lara 41 y.o. female returns for followup visit today.Overall patient is doing well she denies any fevers chills night sweats headaches no shortness of breath no chest pain she does have some fatigue she has no nausea or vomiting no aches or pains. She has a little bit of tenderness where she is receiving radiation there is not much redness noted there. Remainder of the 10 point review of systems is negative.  MEDICAL HISTORY: Past Medical History  Diagnosis Date  . Breast cancer 2001, 2010     S/P Rt mastectomy  BRAC neg  . Ectopic pregnancy, tubal 1998  . Irritable bowel syndrome   . Hemorrhoids   . History of toe surgery   . Anemia     H/O  . Blood transfusion, without reported diagnosis   . Genital HSV   . ASCUS (atypical squamous cells of undetermined significance) on Pap smear 2009    colpo  . Type o blood, rh negative   . Anxiety   . Hx of radiation therapy 01/04/01 - 02/19/01    right breast  . History of chemotherapy     ALLERGIES:  is allergic to percocet.  MEDICATIONS:  Current Outpatient Prescriptions  Medication Sig Dispense Refill  . cholecalciferol (VITAMIN D) 1000 UNITS tablet Take 1,000 Units by mouth daily.        Marland Kitchen dexamethasone (DECADRON) 4 MG tablet Take 2 tablets by mouth daily starting the day after chemotherapy for 2 days. Take with food.  30 tablet  1  . hyaluronate sodium (RADIAPLEXRX) GEL Apply topically 2 (two) times daily.      .  hydrocortisone-pramoxine (PROCTOFOAM-HC) rectal foam Place 1 applicator rectally 3 (three) times daily as needed.  10 g  1  . lidocaine-prilocaine (EMLA) cream Apply topically as needed.  30 g  6  . LORazepam (ATIVAN) 0.5 MG tablet Take 1 tablet (0.5 mg total) by mouth every 6 (six) hours as needed (Nausea or vomiting).  30 tablet  0  . Multiple Vitamin (MULTIVITAMIN PO) Take by mouth daily.        . non-metallic deodorant Thornton Papas) MISC Apply 1 application topically daily as needed.      . ondansetron (ZOFRAN) 8 MG tablet Take 1 tablet two times a day starting the day after chemo for 2 days. Then take 1 tablet two times a day as needed for nausea or vomiting.  30 tablet  1  . polyethylene glycol powder (GLYCOLAX/MIRALAX) powder Take 17 g by mouth as needed.       . prochlorperazine (COMPAZINE) 10 MG tablet Take 1 tablet (10 mg total) by mouth every 6 (six) hours as needed (Nausea or vomiting).  30 tablet  1  . prochlorperazine (COMPAZINE) 25 MG suppository Place 1 suppository (25 mg total) rectally every 12 (twelve) hours as needed for nausea.  12 suppository  3    SURGICAL HISTORY:  Past Surgical History  Procedure Date  . Mastectomy 06/18/09    RT  . Cholecystectomy   . Laparoscopy     salpingectomy  . Eye surgery   . Nasal septum surgery     deviated septum  . Breast lumpectomy 06/05/00    right, ER/PR +, HER 2-  . Axillary node dissection 04/29/12    right, 0/4 nodes pos, mass bx- met adenocarcinoma  . Chest wall biopsy 04/07/12    soft tissue, right -metastatic adenocarcinoma, ER/PR +, HER2 -    REVIEW OF SYSTEMS:  Pertinent items are noted in HPI.   PHYSICAL EXAMINATION:  General: Patient is awake alert in no acute distress. HEENT exam: EOMI PERRLA sclerae anicteric no conjunctival pallor oral mucosa is moist no thrush no mucositis neck is supple no palpable cervical supraclavicular or axillary adenopathy. Lungs: Clear to auscultation and percussion cardiovascular: Regular rate rhythm  no murmurs gallops or rubs abdomen: Soft nontender nondistended bowel sounds are present no hepatosplenomegaly extremities: No clubbing cyanosis or edema neuro: Alert oriented otherwise nonfocal. Chest wall mastectomy site looks clean there is no evidence of local recurrence.  ECOG PERFORMANCE STATUS: 0 - Asymptomatic  Blood pressure 112/76, pulse 78, temperature 98.8 F (37.1 C), temperature source Oral, resp. rate 20, height 5\' 7"  (1.702 m), weight 186 lb 1.6 oz (84.414 kg).  LABORATORY DATA: Lab Results  Component Value Date   WBC 5.4 07/22/2012   HGB 11.7 07/22/2012   HCT 35.5 07/22/2012   MCV 86.2 07/22/2012   PLT 347 07/22/2012      Chemistry      Component Value Date/Time   NA 139 07/06/2012 0752   K 3.7 07/06/2012 0752   CL 104 07/06/2012 0752   CO2 25 07/06/2012 0752   BUN 15 07/06/2012 0752   CREATININE  0.90 07/06/2012 0752      Component Value Date/Time   CALCIUM 8.9 07/06/2012 0752   ALKPHOS 61 07/06/2012 0752   AST 14 07/06/2012 0752   ALT 18 07/06/2012 0752   BILITOT 0.6 07/06/2012 0752       RADIOGRAPHIC STUDIES:  No results found.  ASSESSMENT: 41 year old female with:  1.  history of 2 primary breast cancers in the same breast. She has undergone a mastectomy. Patient now with recurrent breast cancer 2 possibly the right. Lymph nodes and chest wall. patient now with recurrent breast cancer at that is ER positive PR positive likely her original disease back in 2001. Patient has had a right axillary lymph node dissection and removal of what looks like a Rotters node.  #2 patient has had second opinion at [facility] by Dr. Tomi Likens one of the medical oncologists there. Her recommendation was CMF combination chemotherapy followed by an antiestrogen therapy. I do concur with this. However I do think that we should give the CMF concurrently with her radiation therapy. Patient and I discussed this today. I will plan on giving her 2 cycles of CMF up front. Then she could proceed with  getting cyclophosphamide and 5-FU concurrently with radiation therapy. Once she completes her radiation then we will continue the CMF combination. A total of 6 cycles is planned tentatively.  PLAN  #1Patient will proceed with scheduled Cytoxan and 5-FU. She is also receiving radiation therapy today as well. She has no evidence of recurrent disease.  #2 I will plan on seeing the patient back in one week's time. Her blood work looks Financial planner.  All questions were answered. The patient knows to call the clinic with any problems, questions or concerns. We can certainly see the patient much sooner if necessary.  I spent counseling the patient face to face. The total time spent in the appointment was 30 minutes.    Drue Second, MD Medical/Oncology [facility] [phone] (beeper) [phone] (Office)

## 2012-07-22 NOTE — Telephone Encounter (Signed)
As of 07-22-2012 patient is current with schedule

## 2012-07-22 NOTE — Patient Instructions (Addendum)
[facility] Discharge Instructions for Patients Receiving Chemotherapy  Today you received the following chemotherapy agents Fluorouracil and Cytoxan  To help prevent nausea and vomiting after your treatment, we encourage you to take your nausea medication Begin taking it at 7 pm and take it as often as prescribed for the next 24 to 72 hours.   If you develop nausea and vomiting that is not controlled by your nausea medication, call the clinic. If it is after clinic hours your family physician or the after hours number for the clinic or go to the Emergency Department.   BELOW ARE SYMPTOMS THAT SHOULD BE REPORTED IMMEDIATELY:  *FEVER GREATER THAN 100.5 F  *CHILLS WITH OR WITHOUT FEVER  NAUSEA AND VOMITING THAT IS NOT CONTROLLED WITH YOUR NAUSEA MEDICATION  *UNUSUAL SHORTNESS OF BREATH  *UNUSUAL BRUISING OR BLEEDING  TENDERNESS IN MOUTH AND THROAT WITH OR WITHOUT PRESENCE OF ULCERS  *URINARY PROBLEMS  *BOWEL PROBLEMS  UNUSUAL RASH Items with * indicate a potential emergency and should be followed up as soon as possible.  One of the nurses will contact you 24 hours after your treatment. Please let the nurse know about any problems that you may have experienced. Feel free to call the clinic you have any questions or concerns. The clinic phone number is [phone].   I have been informed and understand all the instructions given to me. I know to contact the clinic, my physician, or go to the Emergency Department if any problems should occur. I do not have any questions at this time, but understand that I may call the clinic during office hours   should I have any questions or need assistance in obtaining follow up care.    __________________________________________  _____________  __________ Signature of Patient or Authorized Representative            Date                   Time    __________________________________________ Nurse's Signature

## 2012-07-23 ENCOUNTER — Ambulatory Visit
Admission: RE | Admit: 2012-07-23 | Discharge: 2012-07-23 | Disposition: A | Discharge status: Home / Self Care | Payer: Medicaid Other | Source: Ambulatory Visit | Attending: Radiation Oncology | Admitting: Radiation Oncology

## 2012-07-26 ENCOUNTER — Ambulatory Visit
Admission: RE | Admit: 2012-07-26 | Discharge: 2012-07-26 | Disposition: A | Discharge status: Home / Self Care | Payer: Medicaid Other | Source: Ambulatory Visit | Attending: Radiation Oncology | Admitting: Radiation Oncology

## 2012-07-26 ENCOUNTER — Encounter: Payer: Self-pay | Admitting: Radiation Oncology

## 2012-07-26 VITALS — BP 113/79 | HR 80 | Temp 98.1°F | Resp 20 | Wt 186.9 lb

## 2012-07-26 DIAGNOSIS — C50919 Malignant neoplasm of unspecified site of unspecified female breast: Secondary | ICD-10-CM

## 2012-07-26 NOTE — Progress Notes (Signed)
Pt had chemo last Thurs, states she had headache, rash over face , upper torso, arms which is resolving. She took Benadryl for rash. Fatigued, no loss of appetite. Applying radiaplex lotion to tx area.

## 2012-07-26 NOTE — Progress Notes (Signed)
Weekly Management Note:  Site: Right axilla/supraclavicular region Current Dose:  1440  cGy Projected Dose: 4500  cGy  Narrative: The patient is seen today for routine under treatment assessment. CBCT/MVCT images/port films were reviewed. The chart was reviewed.   She had a headache and developed a rash over the face and upper torso following chemotherapy (CF) last week. She is doing well today.  Physical Examination:  Filed Vitals:   07/26/12 1100  BP: 113/79  Pulse: 80  Temp: 98.1 F (36.7 C)  Resp: 20  .  Weight: 186 lb 14.4 oz (84.777 kg). There is faint erythema the skin along the right clavicular region. There is induration and adjacent to her surgical bed as expected.  Impression: Tolerating radiation therapy well.  Plan: Continue radiation therapy as planned.

## 2012-07-27 ENCOUNTER — Ambulatory Visit
Admission: RE | Admit: 2012-07-27 | Discharge: 2012-07-27 | Disposition: A | Discharge status: Home / Self Care | Payer: Medicaid Other | Source: Ambulatory Visit | Attending: Radiation Oncology | Admitting: Radiation Oncology

## 2012-07-28 ENCOUNTER — Ambulatory Visit
Admission: RE | Admit: 2012-07-28 | Discharge: 2012-07-28 | Disposition: A | Discharge status: Home / Self Care | Payer: Medicaid Other | Source: Ambulatory Visit | Attending: Radiation Oncology | Admitting: Radiation Oncology

## 2012-07-29 ENCOUNTER — Ambulatory Visit
Admission: RE | Admit: 2012-07-29 | Discharge: 2012-07-29 | Disposition: A | Discharge status: Home / Self Care | Payer: Medicaid Other | Source: Ambulatory Visit | Attending: Radiation Oncology | Admitting: Radiation Oncology

## 2012-07-29 ENCOUNTER — Other Ambulatory Visit (HOSPITAL_BASED_OUTPATIENT_CLINIC_OR_DEPARTMENT_OTHER): Payer: Medicaid Other | Admitting: Lab

## 2012-07-29 ENCOUNTER — Ambulatory Visit (HOSPITAL_BASED_OUTPATIENT_CLINIC_OR_DEPARTMENT_OTHER): Payer: Medicaid Other | Admitting: Oncology

## 2012-07-29 VITALS — BP 125/88 | HR 78 | Temp 98.5°F | Resp 20 | Ht 67.0 in | Wt 183.8 lb

## 2012-07-29 DIAGNOSIS — Z853 Personal history of malignant neoplasm of breast: Secondary | ICD-10-CM

## 2012-07-29 DIAGNOSIS — Z923 Personal history of irradiation: Secondary | ICD-10-CM

## 2012-07-29 DIAGNOSIS — C50919 Malignant neoplasm of unspecified site of unspecified female breast: Secondary | ICD-10-CM

## 2012-07-29 DIAGNOSIS — C44599 Other specified malignant neoplasm of skin of other part of trunk: Secondary | ICD-10-CM

## 2012-07-29 LAB — COMPREHENSIVE METABOLIC PANEL (CC13)
Albumin: 3.9 g/dL (ref 3.5–5.0)
BUN: 12 mg/dL (ref 7.0–26.0)
CO2: 26 mEq/L (ref 22–29)
Calcium: 9.2 mg/dL (ref 8.4–10.4)
Chloride: 103 mEq/L (ref 98–107)
Creatinine: 0.9 mg/dL (ref 0.6–1.1)

## 2012-07-29 LAB — CBC WITH DIFFERENTIAL/PLATELET
Basophils Absolute: 0.1 10*3/uL (ref 0.0–0.1)
Eosinophils Absolute: 0.2 10*3/uL (ref 0.0–0.5)
HCT: 34.8 % (ref 34.8–46.6)
HGB: 11.5 g/dL — ABNORMAL LOW (ref 11.6–15.9)
MCH: 28.1 pg (ref 25.1–34.0)
MONO#: 0.2 10*3/uL (ref 0.1–0.9)
NEUT#: 4.3 10*3/uL (ref 1.5–6.5)
NEUT%: 75.7 % (ref 38.4–76.8)
WBC: 5.7 10*3/uL (ref 3.9–10.3)
lymph#: 1 10*3/uL (ref 0.9–3.3)

## 2012-07-29 NOTE — Patient Instructions (Addendum)
Doing well  Continue radiaton therapy Follow up as scheduled

## 2012-07-29 NOTE — Progress Notes (Signed)
[facility] OFFICE PROGRESS NOTE  No primary provider on file.   DIAGNOSIS: 41 year old female with history of invasive ductal carcinoma of the right breast. She was originally diagnosed in June 2011 with a stage II breast cancer. At that time she underwent a lumpectomy followed by chemotherapy given adjuvantly consisting of Adriamycin and Taxotere following CALGB 78295 study. She then went on to receive 5 years of tamoxifen.  She was then diagnosed with a second cancer in the ipsilateral breast measuring 1-0.1 and 1.6 cm with LVI. It was triple negative. She underwent a mastectomy on 06/18/2009 followed by adjuvant chemotherapy consisting of Taxol and carboplatinum which she completed in December of 2010.   PRIOR THERAPY:  #1 stage II right breast cancer diagnosed 2001 status post lumpectomy followed by adjuvant chemotherapy consisting of Adriamycin and Cytoxan following CALGB 62130 study. She then received 5 years of tamoxifen.   #2 second cancer in the ipsilateral breast measuring 2.1 and 1.6 cm with LVI. ER negative PR negative HER-2/neu negative. Status post mastectomy 06/18/2009. She then received Taxol and carboplatinum from 08/16/2009 through 11/08/2009.   #3 the patient was recently seen at [facility] by her surgeon. She was noted to have some pain. She went on to have staging scans performed which revealed no evidence of disease. However on clinical exam patient apparently had a nodule in the right anterior chest wall. This has been biopsied and the pathology is consistent with a recurrent invasive ductal carcinoma that is ER positive PR positive.   #4 patient is now status post right axillary lymph node dissection for level II and 3 lymph nodes there was no evidence of malignancy in 4 lymph nodes. There was soft tissue taken called Aundria Rud node excision that showed 3.1 cm metastatic adenocarcinoma with perineural invasion apparently the tumor was ER positive and PR  positive. No lymph node tissue was identified possibly representing either an entirely replaced lymph node with extracapsular extension or a soft tissue metastasis.   #5 patient began chemotherapy consisting of CMF on 06/10/2012.   #6 Patient is now receiving radiation therapy. Concurrently with radiation she will receive Cytoxan and 5-FU.   CURRENT THERAPY: Concurrent chemoradiation with chemotherapy consisting of Cytoxan and 5-FU. Patient is s/p cycle #3 of her chemotherapy regimen on 07/22/2012.   INTERVAL HISTORY: Breanna Lara 41 y.o. female returns for followup visit today.Overall patient is doing well she denies any fevers, chills, night sweats, headaches; no shortness of breath, no chest pain, she does have some fatigue  but no nausea or vomiting, no aches or pains. She has a little bit of tenderness where she is receiving radiation, which may be related to nerve regeneration at the incision area. She tries to remain as active as possible.  Remainder of the 10 point review of systems is negative.    MEDICAL HISTORY: Past Medical History  Diagnosis Date  . Breast cancer 2001, 2010     S/P Rt mastectomy  BRAC neg  . Ectopic pregnancy, tubal 1998  . Irritable bowel syndrome   . Hemorrhoids   . History of toe surgery   . Anemia     H/O  . Blood transfusion, without reported diagnosis   . Genital HSV   . ASCUS (atypical squamous cells of undetermined significance) on Pap smear 2009    colpo  . Type o blood, rh negative   . Anxiety   . Hx of radiation therapy 01/04/01 - 02/19/01    right breast  .  History of chemotherapy     SURGICAL HISTORY:  Past Surgical History  Procedure Date  . Mastectomy 06/18/09    RT  . Cholecystectomy   . Laparoscopy     salpingectomy  . Eye surgery   . Nasal septum surgery     deviated septum  . Breast lumpectomy 06/05/00    right, ER/PR +, HER 2-  . Axillary node dissection 04/29/12    right, 0/4 nodes pos, mass bx- met adenocarcinoma  .  Chest wall biopsy 04/07/12    soft tissue, right -metastatic adenocarcinoma, ER/PR +, HER2 -    MEDICATIONS: Current Outpatient Prescriptions  Medication Sig Dispense Refill  . cholecalciferol (VITAMIN D) 1000 UNITS tablet Take 1,000 Units by mouth daily.        Marland Kitchen dexamethasone (DECADRON) 4 MG tablet Take 2 tablets by mouth daily starting the day after chemotherapy for 2 days. Take with food.  30 tablet  1  . hyaluronate sodium (RADIAPLEXRX) GEL Apply topically 2 (two) times daily.      . hydrocortisone-pramoxine (PROCTOFOAM-HC) rectal foam Place 1 applicator rectally 3 (three) times daily as needed.  10 g  1  . lidocaine-prilocaine (EMLA) cream Apply topically as needed.  30 g  6  . LORazepam (ATIVAN) 0.5 MG tablet Take 1 tablet (0.5 mg total) by mouth every 6 (six) hours as needed (Nausea or vomiting).  30 tablet  0  . Multiple Vitamin (MULTIVITAMIN PO) Take by mouth daily.        . non-metallic deodorant Thornton Papas) MISC Apply 1 application topically daily as needed.      . ondansetron (ZOFRAN) 8 MG tablet Take 1 tablet two times a day starting the day after chemo for 2 days. Then take 1 tablet two times a day as needed for nausea or vomiting.  30 tablet  1  . polyethylene glycol powder (GLYCOLAX/MIRALAX) powder Take 17 g by mouth as needed.       . prochlorperazine (COMPAZINE) 10 MG tablet Take 1 tablet (10 mg total) by mouth every 6 (six) hours as needed (Nausea or vomiting).  30 tablet  1  . prochlorperazine (COMPAZINE) 25 MG suppository Place 1 suppository (25 mg total) rectally every 12 (twelve) hours as needed for nausea.  12 suppository  3    ALLERGIES:  is allergic to percocet.  REVIEW OF SYSTEMS:  Pertinent items are noted in HPI  Filed Vitals:   07/29/12 0854  BP: 125/88  Pulse: 78  Temp: 98.5 F (36.9 C)  Resp: 20   Wt Readings from Last 3 Encounters:  07/29/12 183 lb 12.8 oz (83.371 kg)  07/26/12 186 lb 14.4 oz (84.777 kg)  07/22/12 186 lb 1.6 oz (84.414 kg)       PHYSICAL EXAMINATION:  General: Patient is awake alert in no acute distress. HEENT exam: EOMI PERRLA sclerae anicteric no conjunctival pallor oral mucosa is moist no thrush no mucositis neck is supple no palpable cervical supraclavicular or axillary adenopathy. Lungs: Clear to auscultation and percussion cardiovascular: Regular rate rhythm no murmurs gallops or rubs abdomen: Soft nontender nondistended bowel sounds are present no hepatosplenomegaly extremities: No clubbing cyanosis or edema neuro: Alert oriented otherwise nonfocal.  Chest wall mastectomy site looks clean there is no evidence of local recurrence. Left Port A Cath without evidence of tenderness or infection.  ECOG PERFORMANCE STATUS: 0 - Asymptomatic     LABORATORY/RADIOLOGY DATA:   Lab 07/29/12 0841  WBC 5.7  HGB 11.5*  HCT 34.8  PLT 191  MCV 85.1  MCH 28.1  MCHC 33.0  RDW 13.4  LYMPHSABS 1.0  MONOABS 0.2  EOSABS 0.2  BASOSABS 0.1  BANDABS --    CMP   No results found for this basename: NA:5,K:5,CL:5,CO2:5,GLUCOSE:5,BUN:5,CREATININE:5,GFRCGP,:5,CALCIUM:5,MG:5,AST:5,ALT:5,ALKPHOS:5,BILITOT:5 in the last 168 hours      Component Value Date/Time   BILITOT 0.3 07/22/2012 0851     Radiology Studies:  No results found.   ASSESSMENT AND PLAN:  41 year old female with:   1. history of 2 primary breast cancers in the same breast. She has undergone a mastectomy. Patient now with recurrent breast cancer 2 possibly the right. Lymph nodes and chest wall. patient now with recurrent breast cancer at that is ER positive PR positive likely her original disease back in 2001. Patient has had a right axillary lymph node dissection and removal of what looks like a Rotters node.   #2 patient has had second opinion at [facility] by Dr. Tomi Likens one of the medical oncologists there. Her recommendation was CMF combination chemotherapy followed by an antiestrogen therapy. I do concur with this. However I do think  that we should give the CMF concurrently with her radiation therapy. Patient and I discussed this today. I will plan on giving her 2 cycles of CMF up front. Then she could proceed with getting cyclophosphamide and 5-FU  concurrently with radiation therapy. Once she completes her radiation then we will continue the CMF combination. A total of 6 cycles is planned tentatively.   PLAN   #1Patient will proceed with scheduled Cytoxan and 5-FU. She is also receiving radiation therapy today as well. She has no evidence of recurrent disease.   #2 I will plan on seeing the patient  On August 12, 2012. Her blood work looks Financial planner.   All questions were answered. The patient knows to call the clinic with any problems, questions or concerns. We can certainly see the patient much sooner if necessary.   I spent counseling the patient face to face.  Case discussed with Dr. Welton Flakes. The total time spent in the appointment was 30 minutes.

## 2012-07-30 ENCOUNTER — Ambulatory Visit
Admission: RE | Admit: 2012-07-30 | Discharge: 2012-07-30 | Disposition: A | Discharge status: Home / Self Care | Payer: Medicaid Other | Source: Ambulatory Visit | Attending: Radiation Oncology | Admitting: Radiation Oncology

## 2012-08-03 ENCOUNTER — Encounter: Payer: Self-pay | Admitting: Radiation Oncology

## 2012-08-03 ENCOUNTER — Ambulatory Visit
Admission: RE | Admit: 2012-08-03 | Discharge: 2012-08-03 | Disposition: A | Discharge status: Home / Self Care | Payer: Medicaid Other | Source: Ambulatory Visit | Attending: Radiation Oncology | Admitting: Radiation Oncology

## 2012-08-03 DIAGNOSIS — C50919 Malignant neoplasm of unspecified site of unspecified female breast: Secondary | ICD-10-CM

## 2012-08-03 NOTE — Progress Notes (Signed)
IMRT simulation/treatment planning note:  [facility] completed her IMRT simulation/treatment planning for treatment to her nodal tumor bed. Dose fine histograms were obtained for the brachial plexus, target, and overlap regions. IMRT was chosen to decrease the dose to the overlap region with her previous tangential fields and also brachial plexus. This could not be done with conventional radiation therapy. We did have an area of overlap with a volume of approximately 7800 cGy delivered to just under 500 cc. He Brachioplexus to less than 6600 cGy . Please see the electronic medical record for specific dose volume histograms. I requesting daily MV CT, setting up to the surgical clips, spine and bone. I prescribing 1800 cGy in 10 sessions utilizing 6 MV photons, helical IMRT Tomotherapy.

## 2012-08-03 NOTE — Progress Notes (Signed)
Patient alert,oriented, x3, here weekly rad txs, 13/15 right axilla, then will do 10 boost, eating and drinking well stated,. Very slight erythema under axilla, occasional twinges there 10:00 AM

## 2012-08-03 NOTE — Progress Notes (Signed)
Weekly Management Note:  Site:R axilla/clavicular region Current Dose:  2340  cGy Projected Dose: 4500 (max dose to overlap 7790)  cGy  Narrative: The patient is seen today for routine under treatment assessment. CBCT/MVCT images/port films were reviewed. The chart was reviewed.   No complaints today. Her IMRT planned for her final 1800 cGy was reviewed and excepted earlier today.  Physical Examination: There were no vitals filed for this visit..  Weight:  . There is erythema along the skin with induration along the infraclavicular/tumor bed region as noted previously.  Impression: Tolerating radiation therapy well.  Plan: Continue radiation therapy as planned.

## 2012-08-04 ENCOUNTER — Ambulatory Visit
Admission: RE | Admit: 2012-08-04 | Discharge: 2012-08-04 | Disposition: A | Discharge status: Home / Self Care | Payer: Medicaid Other | Source: Ambulatory Visit | Attending: Radiation Oncology | Admitting: Radiation Oncology

## 2012-08-05 ENCOUNTER — Ambulatory Visit
Admission: RE | Admit: 2012-08-05 | Discharge: 2012-08-05 | Disposition: A | Discharge status: Home / Self Care | Payer: Medicaid Other | Source: Ambulatory Visit | Attending: Radiation Oncology | Admitting: Radiation Oncology

## 2012-08-06 ENCOUNTER — Telehealth: Payer: Self-pay | Admitting: *Deleted

## 2012-08-06 ENCOUNTER — Ambulatory Visit: Payer: Medicaid Other

## 2012-08-06 ENCOUNTER — Ambulatory Visit
Admission: RE | Admit: 2012-08-06 | Discharge: 2012-08-06 | Disposition: A | Discharge status: Home / Self Care | Payer: Medicaid Other | Source: Ambulatory Visit | Attending: Radiation Oncology | Admitting: Radiation Oncology

## 2012-08-06 NOTE — Telephone Encounter (Signed)
Message left by [employer], pt requesting call. Followed up with pt. Concerns of bleeding. Addressed concerns with MD who advised " this is okay. If any clotting or  Heavy bleeding to call or go to ED." Discussed with pt MD instruction. Pt verbalized understanding, no further questions at this time.

## 2012-08-09 ENCOUNTER — Encounter: Payer: Self-pay | Admitting: Radiation Oncology

## 2012-08-09 ENCOUNTER — Ambulatory Visit
Admission: RE | Admit: 2012-08-09 | Discharge: 2012-08-09 | Disposition: A | Discharge status: Home / Self Care | Payer: Medicaid Other | Source: Ambulatory Visit | Attending: Radiation Oncology | Admitting: Radiation Oncology

## 2012-08-09 ENCOUNTER — Ambulatory Visit: Payer: Medicaid Other

## 2012-08-09 VITALS — BP 108/74 | HR 81 | Temp 98.3°F | Resp 20 | Wt 185.5 lb

## 2012-08-09 DIAGNOSIS — C50919 Malignant neoplasm of unspecified site of unspecified female breast: Secondary | ICD-10-CM

## 2012-08-09 NOTE — Progress Notes (Signed)
Patient alert,oriented x3, compleetd 15/15 r axilla now 2/10 boost, very slight erythema, no c/o pain, occasional zap in that area , eating and drinking well, 10:02 AM

## 2012-08-09 NOTE — Progress Notes (Signed)
Weekly Management Note:  Site:R axilla/clavicular region Current Dose:  3060  cGy Projected Dose: 4500  cGy  Narrative: The patient is seen today for routine under treatment assessment. CBCT/MVCT images/port films were reviewed. The chart was reviewed.  Her treatment setup is excellent. No new complaints today. She is scheduled for more chemotherapy this Thursday.  Physical Examination:  Filed Vitals:   08/09/12 1001  BP: 108/74  Pulse: 81  Temp: 98.3 F (36.8 C)  Resp: 20  .  Weight: 185 lb 8 oz (84.142 kg). Slight erythema along the superior anterior axilla and clavicular region, no desquamation.  Impression: Tolerating radiation therapy well.  Plan: Continue radiation therapy as planned.

## 2012-08-09 NOTE — Progress Notes (Signed)
Chart note: The patient underwent Tomotherapy segmentation on 08/06/2012 in the management of her recurrent carcinoma the right breast. She is being treated to 4.9 delivered field widths corresponding to one set of IMRT treatment devices [phone])

## 2012-08-10 ENCOUNTER — Ambulatory Visit
Admission: RE | Admit: 2012-08-10 | Discharge: 2012-08-10 | Disposition: A | Discharge status: Home / Self Care | Payer: Medicaid Other | Source: Ambulatory Visit | Attending: Radiation Oncology | Admitting: Radiation Oncology

## 2012-08-10 ENCOUNTER — Ambulatory Visit: Payer: Medicaid Other

## 2012-08-11 ENCOUNTER — Ambulatory Visit: Payer: Medicaid Other

## 2012-08-11 ENCOUNTER — Ambulatory Visit
Admission: RE | Admit: 2012-08-11 | Discharge: 2012-08-11 | Disposition: A | Discharge status: Home / Self Care | Payer: Medicaid Other | Source: Ambulatory Visit | Attending: Radiation Oncology | Admitting: Radiation Oncology

## 2012-08-12 ENCOUNTER — Ambulatory Visit (HOSPITAL_BASED_OUTPATIENT_CLINIC_OR_DEPARTMENT_OTHER): Payer: Medicaid Other | Admitting: Oncology

## 2012-08-12 ENCOUNTER — Other Ambulatory Visit (HOSPITAL_BASED_OUTPATIENT_CLINIC_OR_DEPARTMENT_OTHER): Payer: Medicaid Other | Admitting: Lab

## 2012-08-12 ENCOUNTER — Ambulatory Visit (HOSPITAL_BASED_OUTPATIENT_CLINIC_OR_DEPARTMENT_OTHER): Payer: Medicaid Other

## 2012-08-12 ENCOUNTER — Encounter: Payer: Self-pay | Admitting: Oncology

## 2012-08-12 ENCOUNTER — Ambulatory Visit
Admission: RE | Admit: 2012-08-12 | Discharge: 2012-08-12 | Disposition: A | Discharge status: Home / Self Care | Payer: Medicaid Other | Source: Ambulatory Visit | Attending: Radiation Oncology | Admitting: Radiation Oncology

## 2012-08-12 ENCOUNTER — Ambulatory Visit: Payer: Medicaid Other

## 2012-08-12 VITALS — BP 132/84 | HR 82 | Temp 98.1°F | Resp 20 | Ht 67.0 in | Wt 184.9 lb

## 2012-08-12 DIAGNOSIS — Z5111 Encounter for antineoplastic chemotherapy: Secondary | ICD-10-CM

## 2012-08-12 DIAGNOSIS — C50919 Malignant neoplasm of unspecified site of unspecified female breast: Secondary | ICD-10-CM

## 2012-08-12 DIAGNOSIS — C773 Secondary and unspecified malignant neoplasm of axilla and upper limb lymph nodes: Secondary | ICD-10-CM

## 2012-08-12 DIAGNOSIS — Z170 Estrogen receptor positive status [ER+]: Secondary | ICD-10-CM

## 2012-08-12 LAB — CBC WITH DIFFERENTIAL/PLATELET
BASO%: 1.8 % (ref 0.0–2.0)
Basophils Absolute: 0.1 10*3/uL (ref 0.0–0.1)
HCT: 36 % (ref 34.8–46.6)
HGB: 11.8 g/dL (ref 11.6–15.9)
MCHC: 32.8 g/dL (ref 31.5–36.0)
MONO#: 0.6 10*3/uL (ref 0.1–0.9)
NEUT%: 44.5 % (ref 38.4–76.8)
RDW: 14.5 % (ref 11.2–14.5)
WBC: 4.6 10*3/uL (ref 3.9–10.3)
lymph#: 1.3 10*3/uL (ref 0.9–3.3)

## 2012-08-12 LAB — COMPREHENSIVE METABOLIC PANEL (CC13)
Albumin: 3.7 g/dL (ref 3.5–5.0)
Alkaline Phosphatase: 79 U/L (ref 40–150)
BUN: 7 mg/dL (ref 7.0–26.0)
Glucose: 98 mg/dl (ref 70–99)
Potassium: 3.8 mEq/L (ref 3.5–5.1)

## 2012-08-12 MED ORDER — CYCLOPHOSPHAMIDE CHEMO INJECTION 1 GM
600.0000 mg/m2 | Freq: Once | INTRAMUSCULAR | Status: AC
  Administered 2012-08-12: 1180 mg via INTRAVENOUS
  Filled 2012-08-12: qty 59

## 2012-08-12 MED ORDER — OXYCODONE HCL 5 MG PO TABS: 5.0000 mg | ORAL_TABLET | ORAL | Status: AC | PRN

## 2012-08-12 MED ORDER — ONDANSETRON 8 MG/50ML IVPB (CHCC)
8.0000 mg | Freq: Once | INTRAVENOUS | Status: AC
  Administered 2012-08-12: 8 mg via INTRAVENOUS

## 2012-08-12 MED ORDER — DEXAMETHASONE SODIUM PHOSPHATE 10 MG/ML IJ SOLN
10.0000 mg | Freq: Once | INTRAMUSCULAR | Status: AC
Start: 2012-08-12 — End: 2012-08-12
  Administered 2012-08-12: 10 mg via INTRAVENOUS

## 2012-08-12 MED ORDER — SODIUM CHLORIDE 0.9 % IV SOLN
Freq: Once | INTRAVENOUS | Status: AC
  Administered 2012-08-12: 11:00:00 via INTRAVENOUS

## 2012-08-12 MED ORDER — FLUOROURACIL CHEMO INJECTION 2.5 GM/50ML
600.0000 mg/m2 | Freq: Once | INTRAVENOUS | Status: AC
  Administered 2012-08-12: 1200 mg via INTRAVENOUS
  Filled 2012-08-12: qty 24

## 2012-08-12 MED ORDER — SODIUM CHLORIDE 0.9 % IJ SOLN
10.0000 mL | INTRAMUSCULAR | Status: DC | PRN
  Administered 2012-08-12: 10 mL
  Filled 2012-08-12: qty 10

## 2012-08-12 MED ORDER — HEPARIN SOD (PORK) LOCK FLUSH 100 UNIT/ML IV SOLN
500.0000 [IU] | Freq: Once | INTRAVENOUS | Status: AC | PRN
  Administered 2012-08-12: 500 [IU]
  Filled 2012-08-12: qty 5

## 2012-08-12 NOTE — Patient Instructions (Signed)
[facility] Discharge Instructions for Patients Receiving Chemotherapy  Today you received the following chemotherapy agents Cytoxan and 5FU  To help prevent nausea and vomiting after your treatment, we encourage you to take your nausea medication as prescribed.   If you develop nausea and vomiting that is not controlled by your nausea medication, call the clinic. If it is after clinic hours your family physician or the after hours number for the clinic or go to the Emergency Department.   BELOW ARE SYMPTOMS THAT SHOULD BE REPORTED IMMEDIATELY:  *FEVER GREATER THAN 100.5 F  *CHILLS WITH OR WITHOUT FEVER  NAUSEA AND VOMITING THAT IS NOT CONTROLLED WITH YOUR NAUSEA MEDICATION  *UNUSUAL SHORTNESS OF BREATH  *UNUSUAL BRUISING OR BLEEDING  TENDERNESS IN MOUTH AND THROAT WITH OR WITHOUT PRESENCE OF ULCERS  *URINARY PROBLEMS  *BOWEL PROBLEMS  UNUSUAL RASH Items with * indicate a potential emergency and should be followed up as soon as possible.  One of the nurses will contact you 24 hours after your treatment. Please let the nurse know about any problems that you may have experienced. Feel free to call the clinic you have any questions or concerns. The clinic phone number is [phone].   I have been informed and understand all the instructions given to me. I know to contact the clinic, my physician, or go to the Emergency Department if any problems should occur. I do not have any questions at this time, but understand that I may call the clinic during office hours   should I have any questions or need assistance in obtaining follow up care.    __________________________________________  _____________  __________ Signature of Patient or Authorized Representative            Date                   Time    __________________________________________ Nurse's Signature

## 2012-08-12 NOTE — Progress Notes (Signed)
OFFICE PROGRESS NOTE    No primary provider on file. No primary provider on file.  DIAGNOSIS: 41 year old female with history of invasive ductal carcinoma of the right breast. She was originally diagnosed in June 2011 with a stage II breast cancer. At that time she underwent a lumpectomy followed by chemotherapy given adjuvantly consisting of Adriamycin and Taxotere following CALGB 60454 study. She then went on to receive 5 years of tamoxifen.  She was then diagnosed with a second cancer in the ipsilateral breast measuring 1-0.1 and 1.6 cm with LVI. It was triple negative. She underwent a mastectomy on 06/18/2009 followed by adjuvant chemotherapy consisting of Taxol and carboplatinum which she completed in December of 2010.  PRIOR THERAPY:  #1 stage II right breast cancer diagnosed 2001 status post lumpectomy followed by adjuvant chemotherapy consisting of Adriamycin and Cytoxan following CALGB 09811 study. She then received 5 years of tamoxifen.  #2 second cancer in the ipsilateral breast measuring 2.1 and 1.6 cm with LVI. ER negative PR negative HER-2/neu negative. Status post mastectomy 06/18/2009. She then received Taxol and carboplatinum from 08/16/2009 through 11/08/2009.  #3 the patient was recently seen at [facility] by her surgeon. She was noted to have some pain. She went on to have staging scans performed which revealed no evidence of disease. However on clinical exam patient apparently had a nodule in the right anterior chest wall. This has been biopsied and the pathology is consistent with a recurrent invasive ductal carcinoma that is ER positive PR positive.  #4 patient is now status post right axillary lymph node dissection for level II and 3 lymph nodes there was no evidence of malignancy in 4 lymph nodes. There was soft tissue taken called Aundria Rud node excision that showed 3.1 cm metastatic adenocarcinoma with perineural invasion apparently the tumor was ER positive and PR  positive. No lymph node tissue was identified possibly representing either an entirely replaced lymph node with extracapsular extension or a soft tissue metastasis.  #5 patient began chemotherapy consisting of CMF on 06/10/2012.  #6 Patient is now receiving radiation therapy. Concurrently with radiation she will receive Cytoxan and 5-FU.  CURRENT THERAPY: Concurrent chemoradiation with chemotherapy consisting of Cytoxan and 5-FU. Patient is here for cycle #3 of her chemotherapy regimen.  INTERVAL HISTORY: Breanna Lara 41 y.o. female returns for followup visit today.Overall patient is doing well she denies any fevers chills night sweats headaches no shortness of breath no chest pain she does have some fatigue she has no nausea or vomiting no aches or pains. She has a little bit of tenderness where she is receiving radiation. She occasionally does experience pain at the chest wall site due to the radiation.Remainder of the 10 point review of systems is negative.  MEDICAL HISTORY: Past Medical History  Diagnosis Date  . Breast cancer 2001, 2010     S/P Rt mastectomy  BRAC neg  . Ectopic pregnancy, tubal 1998  . Irritable bowel syndrome   . Hemorrhoids   . History of toe surgery   . Anemia     H/O  . Blood transfusion, without reported diagnosis   . Genital HSV   . ASCUS (atypical squamous cells of undetermined significance) on Pap smear 2009    colpo  . Type o blood, rh negative   . Anxiety   . Hx of radiation therapy 01/04/01 - 02/19/01    right breast  . History of chemotherapy     ALLERGIES:  is allergic to percocet.  MEDICATIONS:  Current Outpatient Prescriptions  Medication Sig Dispense Refill  . cholecalciferol (VITAMIN D) 1000 UNITS tablet Take 1,000 Units by mouth daily.        Marland Kitchen dexamethasone (DECADRON) 4 MG tablet Take 2 tablets by mouth daily starting the day after chemotherapy for 2 days. Take with food.  30 tablet  1  . hyaluronate sodium (RADIAPLEXRX) GEL Apply  topically 2 (two) times daily.      . hydrocortisone-pramoxine (PROCTOFOAM-HC) rectal foam Place 1 applicator rectally 3 (three) times daily as needed.  10 g  1  . lidocaine-prilocaine (EMLA) cream Apply topically as needed.  30 g  6  . LORazepam (ATIVAN) 0.5 MG tablet Take 1 tablet (0.5 mg total) by mouth every 6 (six) hours as needed (Nausea or vomiting).  30 tablet  0  . Multiple Vitamin (MULTIVITAMIN PO) Take by mouth daily.        . non-metallic deodorant Thornton Papas) MISC Apply 1 application topically daily as needed.      . ondansetron (ZOFRAN) 8 MG tablet Take 1 tablet two times a day starting the day after chemo for 2 days. Then take 1 tablet two times a day as needed for nausea or vomiting.  30 tablet  1  . polyethylene glycol powder (GLYCOLAX/MIRALAX) powder Take 17 g by mouth as needed.       . prochlorperazine (COMPAZINE) 10 MG tablet Take 1 tablet (10 mg total) by mouth every 6 (six) hours as needed (Nausea or vomiting).  30 tablet  1  . prochlorperazine (COMPAZINE) 25 MG suppository Place 1 suppository (25 mg total) rectally every 12 (twelve) hours as needed for nausea.  12 suppository  3    SURGICAL HISTORY:  Past Surgical History  Procedure Date  . Mastectomy 06/18/09    RT  . Cholecystectomy   . Laparoscopy     salpingectomy  . Eye surgery   . Nasal septum surgery     deviated septum  . Breast lumpectomy 06/05/00    right, ER/PR +, HER 2-  . Axillary node dissection 04/29/12    right, 0/4 nodes pos, mass bx- met adenocarcinoma  . Chest wall biopsy 04/07/12    soft tissue, right -metastatic adenocarcinoma, ER/PR +, HER2 -    REVIEW OF SYSTEMS:  Pertinent items are noted in HPI.   PHYSICAL EXAMINATION:  General: Patient is awake alert in no acute distress. HEENT exam: EOMI PERRLA sclerae anicteric no conjunctival pallor oral mucosa is moist no thrush no mucositis neck is supple no palpable cervical supraclavicular or axillary adenopathy. Lungs: Clear to auscultation and  percussion cardiovascular: Regular rate rhythm no murmurs gallops or rubs abdomen: Soft nontender nondistended bowel sounds are present no hepatosplenomegaly extremities: No clubbing cyanosis or edema neuro: Alert oriented otherwise nonfocal. Chest wall mastectomy site looks clean there is no evidence of local recurrence.  ECOG PERFORMANCE STATUS: 0 - Asymptomatic  Blood pressure 132/84, pulse 82, temperature 98.1 F (36.7 C), temperature source Oral, resp. rate 20, height 5\' 7"  (1.702 m), weight 184 lb 14.4 oz (83.87 kg).  LABORATORY DATA: Lab Results  Component Value Date   WBC 4.6 08/12/2012   HGB 11.8 08/12/2012   HCT 36.0 08/12/2012   MCV 87.0 08/12/2012   PLT 311 08/12/2012      Chemistry      Component Value Date/Time   NA 138 07/29/2012 0841   NA 138 07/22/2012 0851   K 3.6 07/29/2012 0841   K 4.0 07/22/2012 0851   CL  103 07/29/2012 0841   CL 104 07/22/2012 0851   CO2 26 07/29/2012 0841   CO2 27 07/22/2012 0851   BUN 12.0 07/29/2012 0841   BUN 7 07/22/2012 0851   CREATININE 0.9 07/29/2012 0841   CREATININE 0.92 07/22/2012 0851      Component Value Date/Time   CALCIUM 9.2 07/29/2012 0841   CALCIUM 9.1 07/22/2012 0851   ALKPHOS 66 07/29/2012 0841   ALKPHOS 66 07/22/2012 0851   AST 15 07/29/2012 0841   AST 28 07/22/2012 0851   ALT 17 07/29/2012 0841   ALT 36* 07/22/2012 0851   BILITOT 0.70 07/29/2012 0841   BILITOT 0.3 07/22/2012 0851       RADIOGRAPHIC STUDIES:  No results found.  ASSESSMENT: 41 year old female with:  1.  history of 2 primary breast cancers in the same breast. She has undergone a mastectomy. Patient now with recurrent breast cancer 2 possibly the right. Lymph nodes and chest wall. patient now with recurrent breast cancer at that is ER positive PR positive likely her original disease back in 2001. Patient has had a right axillary lymph node dissection and removal of what looks like a Rotters node.  #2 patient has had second opinion at [facility] by Dr. Tomi Likens one of the medical oncologists there. Her recommendation was CMF combination chemotherapy followed by an antiestrogen therapy. I do concur with this. However I do think that we should give the CMF concurrently with her radiation therapy. Patient and I discussed this today. I will plan on giving her 2 cycles of CMF up front. Then she could proceed with getting cyclophosphamide and 5-FU concurrently with radiation therapy. Once she completes her radiation then we will continue the CMF combination. A total of 6 cycles is planned tentatively.  PLAN  #1Patient will proceed with scheduled Cytoxan and 5-FU. She is also receiving radiation therapy today as well. She has no evidence of recurrent disease.  #2 I will give patient a small dose of pain medication in case she does experience more pain so that she doesn't have to call in.  # I will plan on seeing the patient back in one week's time. Her blood work looks Financial planner.  All questions were answered. The patient knows to call the clinic with any problems, questions or concerns. We can certainly see the patient much sooner if necessary.  I spent >25 minutes counseling the patient face to face. The total time spent in the appointment was 30 minutes.    Drue Second, MD Medical/Oncology [facility] [phone] (beeper) [phone] (Office)

## 2012-08-12 NOTE — Patient Instructions (Addendum)
Proceed with chemotherapy as scheduled.  Oxycodone for pain if needed  I will see you back in 1 week for interim labs and follow up

## 2012-08-13 ENCOUNTER — Ambulatory Visit
Admission: RE | Admit: 2012-08-13 | Discharge: 2012-08-13 | Disposition: A | Discharge status: Home / Self Care | Payer: Medicaid Other | Source: Ambulatory Visit | Attending: Radiation Oncology | Admitting: Radiation Oncology

## 2012-08-13 ENCOUNTER — Ambulatory Visit: Payer: Medicaid Other

## 2012-08-16 ENCOUNTER — Encounter: Payer: Self-pay | Admitting: Radiation Oncology

## 2012-08-16 ENCOUNTER — Ambulatory Visit
Admission: RE | Admit: 2012-08-16 | Discharge: 2012-08-16 | Disposition: A | Discharge status: Home / Self Care | Payer: Medicaid Other | Source: Ambulatory Visit | Attending: Radiation Oncology | Admitting: Radiation Oncology

## 2012-08-16 ENCOUNTER — Ambulatory Visit: Payer: Medicaid Other

## 2012-08-16 VITALS — BP 113/77 | HR 91 | Temp 97.5°F | Resp 20 | Wt 185.1 lb

## 2012-08-16 DIAGNOSIS — C50919 Malignant neoplasm of unspecified site of unspecified female breast: Secondary | ICD-10-CM

## 2012-08-16 NOTE — Progress Notes (Signed)
Patient here weekly rad txs 7/10 right axilla boost completed, 3 more txs and done, gave [facility] card to patient,  Alert,oriented x3, occasional twinges in right chest wall area, very minute erythema on skin, no c/o pain,using radiaplex gel  10:04 AM

## 2012-08-16 NOTE — Progress Notes (Signed)
Weekly Management Note:  Site:R axilla/clavicular region Current Dose:  3960  cGy Projected Dose: 4500  cGy  Narrative: The patient is seen today for routine under treatment assessment. CBCT/MVCT images/port films were reviewed. The chart was reviewed.   No complaints today. She had more chemotherapy (CF) this past Thursday. Blood counts have been satisfactory.  Physical Examination:  Filed Vitals:   08/16/12 0959  BP: 113/77  Pulse: 91  Temp: 97.5 F (36.4 C)  Resp: 20  .  Weight: 185 lb 1.6 oz (83.961 kg). There is residual erythema along the clavicular region with focal dry desquamation but no axillary reaction.  Labs:  Lab Results  Component Value Date   WBC 4.6 08/12/2012   HGB 11.8 08/12/2012   HCT 36.0 08/12/2012   MCV 87.0 08/12/2012   PLT 311 08/12/2012    Impression: Tolerating radiation therapy well.  Plan: Continue radiation therapy as planned. She'll finish her region therapy this Thursday. One-month followup.

## 2012-08-17 ENCOUNTER — Ambulatory Visit
Admission: RE | Admit: 2012-08-17 | Discharge: 2012-08-17 | Disposition: A | Discharge status: Home / Self Care | Payer: Medicaid Other | Source: Ambulatory Visit | Attending: Radiation Oncology | Admitting: Radiation Oncology

## 2012-08-17 ENCOUNTER — Ambulatory Visit: Payer: Medicaid Other

## 2012-08-18 ENCOUNTER — Ambulatory Visit
Admission: RE | Admit: 2012-08-18 | Discharge: 2012-08-18 | Disposition: A | Discharge status: Home / Self Care | Payer: Medicaid Other | Source: Ambulatory Visit | Attending: Radiation Oncology | Admitting: Radiation Oncology

## 2012-08-18 ENCOUNTER — Ambulatory Visit: Payer: Medicaid Other

## 2012-08-18 DIAGNOSIS — C50919 Malignant neoplasm of unspecified site of unspecified female breast: Secondary | ICD-10-CM | POA: Insufficient documentation

## 2012-08-18 DIAGNOSIS — Z510 Encounter for antineoplastic radiation therapy: Secondary | ICD-10-CM | POA: Insufficient documentation

## 2012-08-19 ENCOUNTER — Encounter: Payer: Self-pay | Admitting: Radiation Oncology

## 2012-08-19 ENCOUNTER — Ambulatory Visit
Admission: RE | Admit: 2012-08-19 | Discharge: 2012-08-19 | Disposition: A | Discharge status: Home / Self Care | Payer: Medicaid Other | Source: Ambulatory Visit | Attending: Radiation Oncology | Admitting: Radiation Oncology

## 2012-08-19 ENCOUNTER — Ambulatory Visit (HOSPITAL_BASED_OUTPATIENT_CLINIC_OR_DEPARTMENT_OTHER): Payer: Medicaid Other | Admitting: Oncology

## 2012-08-19 ENCOUNTER — Other Ambulatory Visit (HOSPITAL_BASED_OUTPATIENT_CLINIC_OR_DEPARTMENT_OTHER): Payer: Medicaid Other | Admitting: Lab

## 2012-08-19 ENCOUNTER — Ambulatory Visit: Payer: Medicaid Other

## 2012-08-19 VITALS — BP 125/84 | HR 77 | Temp 98.7°F | Resp 20 | Ht 67.0 in | Wt 187.8 lb

## 2012-08-19 DIAGNOSIS — C50919 Malignant neoplasm of unspecified site of unspecified female breast: Secondary | ICD-10-CM

## 2012-08-19 DIAGNOSIS — C44599 Other specified malignant neoplasm of skin of other part of trunk: Secondary | ICD-10-CM

## 2012-08-19 DIAGNOSIS — C773 Secondary and unspecified malignant neoplasm of axilla and upper limb lymph nodes: Secondary | ICD-10-CM

## 2012-08-19 LAB — CBC WITH DIFFERENTIAL/PLATELET
Basophils Absolute: 0.1 10*3/uL (ref 0.0–0.1)
Eosinophils Absolute: 0.3 10*3/uL (ref 0.0–0.5)
HCT: 32.9 % — ABNORMAL LOW (ref 34.8–46.6)
HGB: 11.1 g/dL — ABNORMAL LOW (ref 11.6–15.9)
LYMPH%: 15.8 % (ref 14.0–49.7)
MCV: 87 fL (ref 79.5–101.0)
MONO#: 0.2 10*3/uL (ref 0.1–0.9)
MONO%: 4.3 % (ref 0.0–14.0)
NEUT#: 3.6 10*3/uL (ref 1.5–6.5)
NEUT%: 72.1 % (ref 38.4–76.8)
Platelets: 161 10*3/uL (ref 145–400)
WBC: 5 10*3/uL (ref 3.9–10.3)

## 2012-08-19 LAB — COMPREHENSIVE METABOLIC PANEL (CC13)
BUN: 14 mg/dL (ref 7.0–26.0)
CO2: 25 mEq/L (ref 22–29)
Creatinine: 0.9 mg/dL (ref 0.6–1.1)
Glucose: 100 mg/dl — ABNORMAL HIGH (ref 70–99)
Sodium: 141 mEq/L (ref 136–145)
Total Bilirubin: 0.4 mg/dL (ref 0.20–1.20)
Total Protein: 6.9 g/dL (ref 6.4–8.3)

## 2012-08-19 NOTE — Progress Notes (Signed)
Chart note: On 07/07/2012 Breanna Lara had construction of 2 separate multileaf collimators for her radiation therapy.

## 2012-08-19 NOTE — Progress Notes (Signed)
Simulation verification note: On 07/14/2012 the patient underwent similar to verification for treatment to her right clavicular region. Her isocenter was in good position and the multileaf collimators contoured the treatment volume appropriately.

## 2012-08-19 NOTE — Progress Notes (Signed)
[facility] Radiation Oncology End of Treatment Note  Name:Breanna Lara  Date: 08/19/2012 ZOX:[id number] DOB:[birth year]   Status:outpatient    CC: Dr. Drue Second  REFERRING PHYSICIAN: Dr. Drue Second       DIAGNOSIS: Recurrent carcinoma the right breast, subpectoral lymph nodes    INDICATION FOR TREATMENT: Curative   TREATMENT DATES: 07/15/2012 through 08/19/2012                          SITE/DOSE: Right axilla/clavicular region 4500 cGy 25 sessions                           BEAMS/ENERGY:   Mixed 6 MV/18 MV photons, parallel opposed anterior posterior fields for the first 2700 cGy in 15 sessions. 6 MV photons helical IMRT Tomotherapy for the final 1800 cGy in 10 sessions.                NARRATIVE:  [name] presented a challenge for planning and delivery of her radiation therapy. We fused  her outside CT scan  with her CT simulation CT data set with re- construction her previous treatment to her right chest/breast. We overlap fields for the first 2700 cGy and then changed to IMRT to limit our dose to the brachial plexus/axillary nerve to no more than a cumulative dose of 6600 cGy. She tolerated treatment well with only moderate erythema but otherwise no cutaneous toxicity by completion of therapy.                   PLAN: Routine followup in one month. Patient instructed to call if questions or worsening complaints in interim.

## 2012-08-23 ENCOUNTER — Encounter: Payer: Self-pay | Admitting: Specialist

## 2012-08-23 NOTE — Progress Notes (Signed)
I received a referral from radiation oncology to talk to Breanna Lara about [facility], however, she is still doing chemotherapy.  I suggested she wait until the next session of [facility], which should begin in February 2014.

## 2012-08-29 NOTE — Progress Notes (Signed)
OFFICE PROGRESS NOTE    No primary provider on file. No primary provider on file.  DIAGNOSIS: 41 year old female with history of invasive ductal carcinoma of the right breast. She was originally diagnosed in June 2011 with a stage II breast cancer. At that time she underwent a lumpectomy followed by chemotherapy given adjuvantly consisting of Adriamycin and Taxotere following CALGB 16109 study. She then went on to receive 5 years of tamoxifen.  She was then diagnosed with a second cancer in the ipsilateral breast measuring 1-0.1 and 1.6 cm with LVI. It was triple negative. She underwent a mastectomy on 06/18/2009 followed by adjuvant chemotherapy consisting of Taxol and carboplatinum which she completed in December of 2010.  PRIOR THERAPY:  #1 stage II right breast cancer diagnosed 2001 status post lumpectomy followed by adjuvant chemotherapy consisting of Adriamycin and Cytoxan following CALGB 60454 study. She then received 5 years of tamoxifen.  #2 second cancer in the ipsilateral breast measuring 2.1 and 1.6 cm with LVI. ER negative PR negative HER-2/neu negative. Status post mastectomy 06/18/2009. She then received Taxol and carboplatinum from 08/16/2009 through 11/08/2009.  #3 the patient was recently seen at [facility] by her surgeon. She was noted to have some pain. She went on to have staging scans performed which revealed no evidence of disease. However on clinical exam patient apparently had a nodule in the right anterior chest wall. This has been biopsied and the pathology is consistent with a recurrent invasive ductal carcinoma that is ER positive PR positive.  #4 patient is now status post right axillary lymph node dissection for level II and 3 lymph nodes there was no evidence of malignancy in 4 lymph nodes. There was soft tissue taken called Aundria Rud node excision that showed 3.1 cm metastatic adenocarcinoma with perineural invasion apparently the tumor was ER positive and PR  positive. No lymph node tissue was identified possibly representing either an entirely replaced lymph node with extracapsular extension or a soft tissue metastasis.  #5 patient began chemotherapy consisting of CMF on 06/10/2012.  #6 Patient is now receiving radiation therapy. Concurrently with radiation she will receive Cytoxan and 5-FU.  CURRENT THERAPY: Concurrent chemoradiation with chemotherapy consisting of Cytoxan and 5-FU. Status post cycle #3 of Cytoxan and 5-FU. INTERVAL HISTORY: Breanna Lara 41 y.o. female returns for followup visit today.Overall patient is doing well she denies any fevers chills night sweats headaches no shortness of breath no chest pain she does have some fatigue she has no nausea or vomiting no aches or pains. She has a little bit of tenderness where she is receiving radiation. She occasionally does experience pain at the chest wall site due to the radiation.Remainder of the 10 point review of systems is negative.  MEDICAL HISTORY: Past Medical History  Diagnosis Date  . Breast cancer 2001, 2010     S/P Rt mastectomy  BRAC neg  . Ectopic pregnancy, tubal 1998  . Irritable bowel syndrome   . Hemorrhoids   . History of toe surgery   . Anemia     H/O  . Blood transfusion, without reported diagnosis   . Genital HSV   . ASCUS (atypical squamous cells of undetermined significance) on Pap smear 2009    colpo  . Type o blood, rh negative   . Anxiety   . Hx of radiation therapy 01/04/01 - 02/19/01    right breast  . History of chemotherapy     ALLERGIES:  is allergic to percocet.  MEDICATIONS:  Current  Outpatient Prescriptions  Medication Sig Dispense Refill  . cholecalciferol (VITAMIN D) 1000 UNITS tablet Take 1,000 Units by mouth daily.        Marland Kitchen dexamethasone (DECADRON) 4 MG tablet Take 2 tablets by mouth daily starting the day after chemotherapy for 2 days. Take with food.  30 tablet  1  . hyaluronate sodium (RADIAPLEXRX) GEL Apply topically 2 (two) times  daily.      . hydrocortisone-pramoxine (PROCTOFOAM-HC) rectal foam Place 1 applicator rectally 3 (three) times daily as needed.  10 g  1  . lidocaine-prilocaine (EMLA) cream Apply topically as needed.  30 g  6  . LORazepam (ATIVAN) 0.5 MG tablet Take 1 tablet (0.5 mg total) by mouth every 6 (six) hours as needed (Nausea or vomiting).  30 tablet  0  . Multiple Vitamin (MULTIVITAMIN PO) Take by mouth daily.        . non-metallic deodorant Thornton Papas) MISC Apply 1 application topically daily as needed.      . ondansetron (ZOFRAN) 8 MG tablet Take 1 tablet two times a day starting the day after chemo for 2 days. Then take 1 tablet two times a day as needed for nausea or vomiting.  30 tablet  1  . polyethylene glycol powder (GLYCOLAX/MIRALAX) powder Take 17 g by mouth as needed.       . prochlorperazine (COMPAZINE) 10 MG tablet Take 1 tablet (10 mg total) by mouth every 6 (six) hours as needed (Nausea or vomiting).  30 tablet  1  . prochlorperazine (COMPAZINE) 25 MG suppository Place 1 suppository (25 mg total) rectally every 12 (twelve) hours as needed for nausea.  12 suppository  3    SURGICAL HISTORY:  Past Surgical History  Procedure Date  . Mastectomy 06/18/09    RT  . Cholecystectomy   . Laparoscopy     salpingectomy  . Eye surgery   . Nasal septum surgery     deviated septum  . Breast lumpectomy 06/05/00    right, ER/PR +, HER 2-  . Axillary node dissection 04/29/12    right, 0/4 nodes pos, mass bx- met adenocarcinoma  . Chest wall biopsy 04/07/12    soft tissue, right -metastatic adenocarcinoma, ER/PR +, HER2 -    REVIEW OF SYSTEMS:  Pertinent items are noted in HPI.   PHYSICAL EXAMINATION:  General: Patient is awake alert in no acute distress. HEENT exam: EOMI PERRLA sclerae anicteric no conjunctival pallor oral mucosa is moist no thrush no mucositis neck is supple no palpable cervical supraclavicular or axillary adenopathy. Lungs: Clear to auscultation and percussion cardiovascular:  Regular rate rhythm no murmurs gallops or rubs abdomen: Soft nontender nondistended bowel sounds are present no hepatosplenomegaly extremities: No clubbing cyanosis or edema neuro: Alert oriented otherwise nonfocal. Chest wall mastectomy site looks clean there is no evidence of local recurrence.  ECOG PERFORMANCE STATUS: 0 - Asymptomatic  Blood pressure 125/84, pulse 77, temperature 98.7 F (37.1 C), temperature source Oral, resp. rate 20, height 5\' 7"  (1.702 m), weight 187 lb 12.8 oz (85.186 kg).  LABORATORY DATA: Lab Results  Component Value Date   WBC 5.0 08/19/2012   HGB 11.1* 08/19/2012   HCT 32.9* 08/19/2012   MCV 87.0 08/19/2012   PLT 161 08/19/2012      Chemistry      Component Value Date/Time   NA 141 08/19/2012 0936   NA 138 07/22/2012 0851   K 4.0 08/19/2012 0936   K 4.0 07/22/2012 0851   CL 104 08/19/2012 0936  CL 104 07/22/2012 0851   CO2 25 08/19/2012 0936   CO2 27 07/22/2012 0851   BUN 14.0 08/19/2012 0936   BUN 7 07/22/2012 0851   CREATININE 0.9 08/19/2012 0936   CREATININE 0.92 07/22/2012 0851      Component Value Date/Time   CALCIUM 9.1 08/19/2012 0936   CALCIUM 9.1 07/22/2012 0851   ALKPHOS 71 08/19/2012 0936   ALKPHOS 66 07/22/2012 0851   AST 13 08/19/2012 0936   AST 28 07/22/2012 0851   ALT 15 08/19/2012 0936   ALT 36* 07/22/2012 0851   BILITOT 0.40 08/19/2012 0936   BILITOT 0.3 07/22/2012 0851       RADIOGRAPHIC STUDIES:  No results found.  ASSESSMENT: 41 year old female with:  1.  history of 2 primary breast cancers in the same breast. She has undergone a mastectomy. Patient now with recurrent breast cancer 2 possibly the right. Lymph nodes and chest wall. patient now with recurrent breast cancer at that is ER positive PR positive likely her original disease back in 2001. Patient has had a right axillary lymph node dissection and removal of what looks like a Rotters node.  #2 patient has had second opinion at [facility] by Dr. Tomi Likens one of the medical  oncologists there. Her recommendation was CMF combination chemotherapy followed by an antiestrogen therapy. I do concur with this. However I do think that we should give the CMF concurrently with her radiation therapy. Patient and I discussed this today. I will plan on giving her 2 cycles of CMF up front. Then she could proceed with getting cyclophosphamide and 5-FU concurrently with radiation therapy. Once she completes her radiation then we will continue the CMF combination. A total of 6 cycles is planned tentatively.  PLAN  #1 overall patient is tolerating her chemotherapy and radiation very well she does of course have some redness due to the radiation therapy.  #2 she will return in 2 weeks' time for followup and next cycle of CMF.  All questions were answered. The patient knows to call the clinic with any problems, questions or concerns. We can certainly see the patient much sooner if necessary.  I spent >15 minutes counseling the patient face to face. The total time spent in the appointment was 30 minutes.    Drue Second, MD Medical/Oncology [facility] [phone] (beeper) [phone] (Office)

## 2012-09-02 ENCOUNTER — Telehealth: Payer: Self-pay | Admitting: Emergency Medicine

## 2012-09-02 ENCOUNTER — Ambulatory Visit (HOSPITAL_BASED_OUTPATIENT_CLINIC_OR_DEPARTMENT_OTHER): Payer: Medicaid Other | Admitting: Oncology

## 2012-09-02 ENCOUNTER — Encounter: Payer: Self-pay | Admitting: Oncology

## 2012-09-02 ENCOUNTER — Other Ambulatory Visit: Payer: Medicaid Other | Admitting: Lab

## 2012-09-02 ENCOUNTER — Other Ambulatory Visit: Payer: Self-pay | Admitting: Oncology

## 2012-09-02 ENCOUNTER — Ambulatory Visit (HOSPITAL_BASED_OUTPATIENT_CLINIC_OR_DEPARTMENT_OTHER): Payer: Medicaid Other

## 2012-09-02 VITALS — BP 119/79 | HR 88 | Temp 98.9°F | Resp 20 | Ht 67.0 in | Wt 186.7 lb

## 2012-09-02 DIAGNOSIS — Z5111 Encounter for antineoplastic chemotherapy: Secondary | ICD-10-CM

## 2012-09-02 DIAGNOSIS — Z170 Estrogen receptor positive status [ER+]: Secondary | ICD-10-CM

## 2012-09-02 DIAGNOSIS — C50919 Malignant neoplasm of unspecified site of unspecified female breast: Secondary | ICD-10-CM

## 2012-09-02 DIAGNOSIS — C773 Secondary and unspecified malignant neoplasm of axilla and upper limb lymph nodes: Secondary | ICD-10-CM

## 2012-09-02 DIAGNOSIS — C779 Secondary and unspecified malignant neoplasm of lymph node, unspecified: Secondary | ICD-10-CM

## 2012-09-02 LAB — COMPREHENSIVE METABOLIC PANEL (CC13)
Albumin: 3.8 g/dL (ref 3.5–5.0)
Alkaline Phosphatase: 80 U/L (ref 40–150)
Calcium: 9.1 mg/dL (ref 8.4–10.4)
Chloride: 105 mEq/L (ref 98–107)
Glucose: 106 mg/dl — ABNORMAL HIGH (ref 70–99)
Potassium: 3.8 mEq/L (ref 3.5–5.1)
Sodium: 139 mEq/L (ref 136–145)
Total Protein: 7.3 g/dL (ref 6.4–8.3)

## 2012-09-02 LAB — CBC WITH DIFFERENTIAL/PLATELET
EOS%: 12.7 % — ABNORMAL HIGH (ref 0.0–7.0)
MCH: 29 pg (ref 25.1–34.0)
MCV: 87.5 fL (ref 79.5–101.0)
MONO%: 19.5 % — ABNORMAL HIGH (ref 0.0–14.0)
NEUT#: 1.6 10*3/uL (ref 1.5–6.5)
RBC: 4 10*6/uL (ref 3.70–5.45)
RDW: 15 % — ABNORMAL HIGH (ref 11.2–14.5)
lymph#: 1.1 10*3/uL (ref 0.9–3.3)
nRBC: 0 % (ref 0–0)

## 2012-09-02 MED ORDER — SODIUM CHLORIDE 0.9 % IV SOLN
600.0000 mg/m2 | Freq: Once | INTRAVENOUS | Status: AC
  Administered 2012-09-02: 1180 mg via INTRAVENOUS
  Filled 2012-09-02: qty 59

## 2012-09-02 MED ORDER — SODIUM CHLORIDE 0.9 % IV SOLN: Freq: Once | INTRAVENOUS | Status: DC

## 2012-09-02 MED ORDER — METHOTREXATE SODIUM CHEMO INJECTION 25 MG/ML
40.0000 mg/m2 | Freq: Once | INTRAMUSCULAR | Status: AC
  Administered 2012-09-02: 80 mg via INTRAVENOUS
  Filled 2012-09-02: qty 3.2

## 2012-09-02 MED ORDER — DEXAMETHASONE SODIUM PHOSPHATE 10 MG/ML IJ SOLN
10.0000 mg | Freq: Once | INTRAMUSCULAR | Status: AC
  Administered 2012-09-02: 10 mg via INTRAVENOUS

## 2012-09-02 MED ORDER — SODIUM CHLORIDE 0.9 % IJ SOLN
10.0000 mL | INTRAMUSCULAR | Status: DC | PRN
  Administered 2012-09-02: 10 mL
  Filled 2012-09-02: qty 10

## 2012-09-02 MED ORDER — ONDANSETRON 8 MG/50ML IVPB (CHCC)
8.0000 mg | Freq: Once | INTRAVENOUS | Status: AC
  Administered 2012-09-02: 8 mg via INTRAVENOUS

## 2012-09-02 MED ORDER — FLUOROURACIL CHEMO INJECTION 2.5 GM/50ML
600.0000 mg/m2 | Freq: Once | INTRAVENOUS | Status: AC
  Administered 2012-09-02: 1200 mg via INTRAVENOUS
  Filled 2012-09-02: qty 24

## 2012-09-02 NOTE — Patient Instructions (Addendum)
You are doing well.  You will receive chemotherapy today, and we will see you back next week.  Okay to go back to work.    Please call with any questions or concerns.

## 2012-09-02 NOTE — Patient Instructions (Signed)
[facility] Discharge Instructions for Patients Receiving Chemotherapy  Today you received the following chemotherapy agents Cytoxan, Adrucil and Methotrexate.  To help prevent nausea and vomiting after your treatment, we encourage you to take your nausea medication. Begin taking your nausea medication as often as prescribed for by Dr. Welton Flakes.    If you develop nausea and vomiting that is not controlled by your nausea medication, call the clinic. If it is after clinic hours your family physician or the after hours number for the clinic or go to the Emergency Department.   BELOW ARE SYMPTOMS THAT SHOULD BE REPORTED IMMEDIATELY:  *FEVER GREATER THAN 100.5 F  *CHILLS WITH OR WITHOUT FEVER  NAUSEA AND VOMITING THAT IS NOT CONTROLLED WITH YOUR NAUSEA MEDICATION  *UNUSUAL SHORTNESS OF BREATH  *UNUSUAL BRUISING OR BLEEDING  TENDERNESS IN MOUTH AND THROAT WITH OR WITHOUT PRESENCE OF ULCERS  *URINARY PROBLEMS  *BOWEL PROBLEMS  UNUSUAL RASH Items with * indicate a potential emergency and should be followed up as soon as possible.  One of the nurses will contact you 24 hours after your treatment. Please let the nurse know about any problems that you may have experienced. Feel free to call the clinic you have any questions or concerns. The clinic phone number is [phone].   I have been informed and understand all the instructions given to me. I know to contact the clinic, my physician, or go to the Emergency Department if any problems should occur. I do not have any questions at this time, but understand that I may call the clinic during office hours   should I have any questions or need assistance in obtaining follow up care.    __________________________________________  _____________  __________ Signature of Patient or Authorized Representative            Date                   Time    __________________________________________ Nurse's Signature

## 2012-09-02 NOTE — Progress Notes (Signed)
OFFICE PROGRESS NOTE    No primary provider on file. No primary provider on file.  DIAGNOSIS: 41 year old female with history of invasive ductal carcinoma of the right breast. She was originally diagnosed in June 2001 with a stage II breast cancer. At that time she underwent a lumpectomy followed by chemotherapy given adjuvantly consisting of Adriamycin and Taxotere following CALGB 84696 study. She then went on to receive 5 years of tamoxifen.  She was then diagnosed with a second cancer in the ipsilateral breast measuring 1-0.1 and 1.6 cm with LVI. It was triple negative. She underwent a mastectomy on 06/18/2009 followed by adjuvant chemotherapy consisting of Taxol and carboplatinum which she completed in December of 2010.  PRIOR THERAPY:  #1 stage II right breast cancer diagnosed 2001 status post lumpectomy followed by adjuvant chemotherapy consisting of Adriamycin and Cytoxan following CALGB 29528 study. She then received 5 years of tamoxifen.  #2 second cancer in the ipsilateral breast measuring 2.1 and 1.6 cm with LVI. ER negative PR negative HER-2/neu negative. Status post mastectomy 06/18/2009. She then received Taxol and carboplatinum from 08/16/2009 through 11/08/2009.  #3 the patient was recently seen at [facility] by her surgeon. She was noted to have some pain. She went on to have staging scans performed which revealed no evidence of disease. However on clinical exam patient apparently had a nodule in the right anterior chest wall. This has been biopsied and the pathology is consistent with a recurrent invasive ductal carcinoma that is ER positive PR positive.  #4 patient is now status post right axillary lymph node dissection for level II and 3 lymph nodes there was no evidence of malignancy in 4 lymph nodes. There was soft tissue taken called Aundria Rud node excision that showed 3.1 cm metastatic adenocarcinoma with perineural invasion apparently the tumor was ER positive and PR  positive. No lymph node tissue was identified possibly representing either an entirely replaced lymph node with extracapsular extension or a soft tissue metastasis.  #5 patient began chemotherapy consisting of CMF on 06/10/2012.  #6 Patient is now receiving radiation therapy. Concurrently with radiation she will receive Cytoxan and 5-FU.  CURRENT THERAPY: Concurrent chemoradiation with chemotherapy consisting of Cytoxan and 5-FU.  INTERVAL HISTORY: Breanna Lara 41 y.o. female returns for followup visit today.  She is slightly fatigued, but is otherwise feeling okay.  She does get a slight rash with the chemotherapy, but is able to tolerate it with benadryl and steroids. She is inquiring about working again.  She has mild gum bleeding with tooth brushing that is short lived, and is otherwise she is feeling well and w/o questions or concerns.      MEDICAL HISTORY: Past Medical History  Diagnosis Date  . Breast cancer 2001, 2010     S/P Rt mastectomy  BRAC neg  . Ectopic pregnancy, tubal 1998  . Irritable bowel syndrome   . Hemorrhoids   . History of toe surgery   . Anemia     H/O  . Blood transfusion, without reported diagnosis   . Genital HSV   . ASCUS (atypical squamous cells of undetermined significance) on Pap smear 2009    colpo  . Type o blood, rh negative   . Anxiety   . Hx of radiation therapy 01/04/01 - 02/19/01    right breast  . History of chemotherapy     ALLERGIES:  is allergic to percocet.  MEDICATIONS:  Current Outpatient Prescriptions  Medication Sig Dispense Refill  . cholecalciferol (VITAMIN  D) 1000 UNITS tablet Take 1,000 Units by mouth daily.        Marland Kitchen dexamethasone (DECADRON) 4 MG tablet Take 2 tablets by mouth daily starting the day after chemotherapy for 2 days. Take with food.  30 tablet  1  . hyaluronate sodium (RADIAPLEXRX) GEL Apply topically 2 (two) times daily.      Marland Kitchen lidocaine-prilocaine (EMLA) cream Apply topically as needed.  30 g  6  . LORazepam  (ATIVAN) 0.5 MG tablet Take 1 tablet (0.5 mg total) by mouth every 6 (six) hours as needed (Nausea or vomiting).  30 tablet  0  . Multiple Vitamin (MULTIVITAMIN PO) Take by mouth daily.        . non-metallic deodorant Thornton Papas) MISC Apply 1 application topically daily as needed.      . ondansetron (ZOFRAN) 8 MG tablet Take 1 tablet two times a day starting the day after chemo for 2 days. Then take 1 tablet two times a day as needed for nausea or vomiting.  30 tablet  1  . prochlorperazine (COMPAZINE) 10 MG tablet Take 1 tablet (10 mg total) by mouth every 6 (six) hours as needed (Nausea or vomiting).  30 tablet  1  . hydrocortisone-pramoxine (PROCTOFOAM-HC) rectal foam Place 1 applicator rectally 3 (three) times daily as needed.  10 g  1  . polyethylene glycol powder (GLYCOLAX/MIRALAX) powder Take 17 g by mouth as needed.       . prochlorperazine (COMPAZINE) 25 MG suppository Place 1 suppository (25 mg total) rectally every 12 (twelve) hours as needed for nausea.  12 suppository  3    SURGICAL HISTORY:  Past Surgical History  Procedure Date  . Mastectomy 06/18/09    RT  . Cholecystectomy   . Laparoscopy     salpingectomy  . Eye surgery   . Nasal septum surgery     deviated septum  . Breast lumpectomy 06/05/00    right, ER/PR +, HER 2-  . Axillary node dissection 04/29/12    right, 0/4 nodes pos, mass bx- met adenocarcinoma  . Chest wall biopsy 04/07/12    soft tissue, right -metastatic adenocarcinoma, ER/PR +, HER2 -    REVIEW OF SYSTEMS:   General: fatigue (+), night sweats (-), fever (-), pain (-) Lymph: palpable nodes (-) HEENT: vision changes (-), mucositis (-), gum bleeding (+), epistaxis (-) Cardiovascular: chest pain (-), palpitations (-) Pulmonary: shortness of breath (-), dyspnea (-), cough (-), hemoptysis (-) GI:  Early satiety (-), melena (-), dysphagia (-), nausea/vomiting (-), diarrhea (-) GU: dysuria (-), hematuria (-), incontinence (-) Musculoskeletal: joint swelling (-),  joint pain (-), back pain (-) Neuro: weakness (-), numbness (-), headache (-), confusion (-) Skin: Rash (-), lesions (-), dryness (-) Psych: depression (-), suicidal/homicidal ideation (-), feeling of hopelessness (-)  PHYSICAL EXAMINATION:  General: Patient is a well appearing female in no acute distress HEENT: PERRLA, sclerae anicteric no conjunctival pallor, MMM Neck: supple, no palpable adenopathy Lungs: clear to auscultation bilaterally, no wheezes, rhonchi, or rales Cardiovascular: regular rate rhythm, S1, S2, no murmurs, rubs or gallops Abdomen: Soft, non-tender, non-distended, normoactive bowel sounds, no HSM Extremities: warm and well perfused, no clubbing, cyanosis, or edema Skin: No rashes or lesions Neuro: Non-focal Breast: Right chest wall looks well healed, no evidence of local recurrence  ECOG PERFORMANCE STATUS: 0 - Asymptomatic  Blood pressure 119/79, pulse 88, temperature 98.9 F (37.2 C), temperature source Oral, resp. rate 20, height 5\' 7"  (1.702 m), weight 186 lb 11.2 oz (84.687  kg).  LABORATORY DATA: Lab Results  Component Value Date   WBC 4.1 09/02/2012   HGB 11.6 09/02/2012   HCT 35.0 09/02/2012   MCV 87.5 09/02/2012   PLT 247 09/02/2012      Chemistry      Component Value Date/Time   NA 141 08/19/2012 0936   NA 138 07/22/2012 0851   K 4.0 08/19/2012 0936   K 4.0 07/22/2012 0851   CL 104 08/19/2012 0936   CL 104 07/22/2012 0851   CO2 25 08/19/2012 0936   CO2 27 07/22/2012 0851   BUN 14.0 08/19/2012 0936   BUN 7 07/22/2012 0851   CREATININE 0.9 08/19/2012 0936   CREATININE 0.92 07/22/2012 0851      Component Value Date/Time   CALCIUM 9.1 08/19/2012 0936   CALCIUM 9.1 07/22/2012 0851   ALKPHOS 71 08/19/2012 0936   ALKPHOS 66 07/22/2012 0851   AST 13 08/19/2012 0936   AST 28 07/22/2012 0851   ALT 15 08/19/2012 0936   ALT 36* 07/22/2012 0851   BILITOT 0.40 08/19/2012 0936   BILITOT 0.3 07/22/2012 0851       RADIOGRAPHIC STUDIES:  No results  found.  ASSESSMENT: 41 year old female with:  1.  history of 2 primary breast cancers in the same breast. She has undergone a mastectomy. Patient now with recurrent breast cancer 2 possibly the right. Lymph nodes and chest wall. patient now with recurrent breast cancer at that is ER positive PR positive likely her original disease back in 2001. Patient has had a right axillary lymph node dissection and removal of what looks like a Rotters node.  #2 patient has had second opinion at [facility] by Dr. Tomi Likens one of the medical oncologists there. Her recommendation was CMF combination chemotherapy followed by an antiestrogen therapy. I do concur with this. However I do think that we should give the CMF concurrently with her radiation therapy. Patient and I discussed this today. I will plan on giving her 2 cycles of CMF up front. Then she could proceed with getting cyclophosphamide and 5-FU concurrently with radiation therapy. Once she completes her radiation then we will continue the CMF combination. A total of 6 cycles is planned tentatively.  PLAN  #1 She has completed radiation therapy.  She is tolerating the chemotherapy well.  Will receive cycle 5 of CMF today.    #2 She will return in one week for follow-up.    All questions were answered. The patient knows to call the clinic with any problems, questions or concerns. We can certainly see the patient much sooner if necessary.  I spent >15 minutes counseling the patient face to face. The total time spent in the appointment was 30 minutes.    Cherie Ouch Lyn Hollingshead, NP Medical Oncology [facility] Phone: [phone]  ATTENDING'S ATTESTATION:  I personally reviewed patient's chart, examined patient myself, formulated the treatment plan as above  Drue Second, MD Medical/Oncology [facility] [phone] (beeper) [phone] (Office)  09/03/2012, 1:27 PM

## 2012-09-09 ENCOUNTER — Ambulatory Visit (HOSPITAL_BASED_OUTPATIENT_CLINIC_OR_DEPARTMENT_OTHER): Payer: Medicaid Other | Admitting: Adult Health

## 2012-09-09 ENCOUNTER — Encounter: Payer: Self-pay | Admitting: Adult Health

## 2012-09-09 ENCOUNTER — Other Ambulatory Visit (HOSPITAL_BASED_OUTPATIENT_CLINIC_OR_DEPARTMENT_OTHER): Payer: Medicaid Other | Admitting: Lab

## 2012-09-09 VITALS — BP 122/85 | HR 85 | Temp 98.7°F | Resp 20 | Ht 67.0 in | Wt 187.2 lb

## 2012-09-09 DIAGNOSIS — R5381 Other malaise: Secondary | ICD-10-CM

## 2012-09-09 DIAGNOSIS — C50919 Malignant neoplasm of unspecified site of unspecified female breast: Secondary | ICD-10-CM

## 2012-09-09 DIAGNOSIS — C773 Secondary and unspecified malignant neoplasm of axilla and upper limb lymph nodes: Secondary | ICD-10-CM

## 2012-09-09 LAB — CBC WITH DIFFERENTIAL/PLATELET
Basophils Absolute: 0.1 10*3/uL (ref 0.0–0.1)
EOS%: 8.7 % — ABNORMAL HIGH (ref 0.0–7.0)
Eosinophils Absolute: 0.4 10*3/uL (ref 0.0–0.5)
LYMPH%: 13.9 % — ABNORMAL LOW (ref 14.0–49.7)
MCH: 29.8 pg (ref 25.1–34.0)
MCV: 88.2 fL (ref 79.5–101.0)
MONO%: 3.7 % (ref 0.0–14.0)
Platelets: 154 10*3/uL (ref 145–400)
RBC: 3.77 10*6/uL (ref 3.70–5.45)
RDW: 15.1 % — ABNORMAL HIGH (ref 11.2–14.5)

## 2012-09-09 LAB — COMPREHENSIVE METABOLIC PANEL (CC13)
AST: 19 U/L (ref 5–34)
Albumin: 3.8 g/dL (ref 3.5–5.0)
Alkaline Phosphatase: 72 U/L (ref 40–150)
BUN: 12 mg/dL (ref 7.0–26.0)
Glucose: 102 mg/dl — ABNORMAL HIGH (ref 70–99)
Potassium: 4.3 mEq/L (ref 3.5–5.1)
Sodium: 140 mEq/L (ref 136–145)
Total Bilirubin: 0.4 mg/dL (ref 0.20–1.20)

## 2012-09-09 NOTE — Progress Notes (Signed)
OFFICE PROGRESS NOTE    No primary provider on file. No primary provider on file.  DIAGNOSIS: 41 year old female with history of invasive ductal carcinoma of the right breast. She was originally diagnosed in June 2001 with a stage II breast cancer. At that time she underwent a lumpectomy followed by chemotherapy given adjuvantly consisting of Adriamycin and Taxotere following CALGB 45409 study. She then went on to receive 5 years of tamoxifen.  She was then diagnosed with a second cancer in the ipsilateral breast measuring 1-0.1 and 1.6 cm with LVI. It was triple negative. She underwent a mastectomy on 06/18/2009 followed by adjuvant chemotherapy consisting of Taxol and carboplatinum which she completed in December of 2010.  PRIOR THERAPY:  #1 stage II right breast cancer diagnosed 2001 status post lumpectomy followed by adjuvant chemotherapy consisting of Adriamycin and Cytoxan following CALGB 81191 study. She then received 5 years of tamoxifen.  #2 second cancer in the ipsilateral breast measuring 2.1 and 1.6 cm with LVI. ER negative PR negative HER-2/neu negative. Status post mastectomy 06/18/2009. She then received Taxol and carboplatinum from 08/16/2009 through 11/08/2009.  #3 the patient was recently seen at [facility] by her surgeon. She was noted to have some pain. She went on to have staging scans performed which revealed no evidence of disease. However on clinical exam patient apparently had a nodule in the right anterior chest wall. This has been biopsied and the pathology is consistent with a recurrent invasive ductal carcinoma that is ER positive PR positive.  #4 patient is now status post right axillary lymph node dissection for level II and 3 lymph nodes there was no evidence of malignancy in 4 lymph nodes. There was soft tissue taken called Aundria Rud node excision that showed 3.1 cm metastatic adenocarcinoma with perineural invasion apparently the tumor was ER positive and PR  positive. No lymph node tissue was identified possibly representing either an entirely replaced lymph node with extracapsular extension or a soft tissue metastasis.  #5 patient began chemotherapy consisting of CMF on 06/10/2012.  #6 Patient received radiation therapy from 07/15/12 to 08/19/12. Concurrently with radiation she received Cytoxan and 5-FU (cycles 2-4).  #7 Radiation completed, CMF therapy beginning with cycle 5.  CURRENT THERAPY: Cycle 5 day 8 CMF  INTERVAL HISTORY: Breanna Lara 41 y.o. female returns for followup visit today.  She is slightly fatigued, but is otherwise feeling okay.  She does get a slight rash with the chemotherapy, but it went away with benadryl.  She also had diarrhea last week that lasted for a day and has since resolved.  Otherwise she is feeling well and without complaints.  Her husband has to be at work early today, so I saw her first, and she's getting her labs drawn second.      MEDICAL HISTORY: Past Medical History  Diagnosis Date  . Breast cancer 2001, 2010     S/P Rt mastectomy  BRAC neg  . Ectopic pregnancy, tubal 1998  . Irritable bowel syndrome   . Hemorrhoids   . History of toe surgery   . Anemia     H/O  . Blood transfusion, without reported diagnosis   . Genital HSV   . ASCUS (atypical squamous cells of undetermined significance) on Pap smear 2009    colpo  . Type o blood, rh negative   . Anxiety   . Hx of radiation therapy 01/04/01 - 02/19/01    right breast  . History of chemotherapy  ALLERGIES:  is allergic to percocet.  MEDICATIONS:  Current Outpatient Prescriptions  Medication Sig Dispense Refill  . cholecalciferol (VITAMIN D) 1000 UNITS tablet Take 1,000 Units by mouth daily.        Marland Kitchen dexamethasone (DECADRON) 4 MG tablet Take 2 tablets by mouth daily starting the day after chemotherapy for 2 days. Take with food.  30 tablet  1  . hyaluronate sodium (RADIAPLEXRX) GEL Apply topically 2 (two) times daily.      .  hydrocortisone-pramoxine (PROCTOFOAM-HC) rectal foam Place 1 applicator rectally 3 (three) times daily as needed.  10 g  1  . lidocaine-prilocaine (EMLA) cream Apply topically as needed.  30 g  6  . LORazepam (ATIVAN) 0.5 MG tablet Take 1 tablet (0.5 mg total) by mouth every 6 (six) hours as needed (Nausea or vomiting).  30 tablet  0  . Multiple Vitamin (MULTIVITAMIN PO) Take by mouth daily.        . non-metallic deodorant Thornton Papas) MISC Apply 1 application topically daily as needed.      . ondansetron (ZOFRAN) 8 MG tablet Take 1 tablet two times a day starting the day after chemo for 2 days. Then take 1 tablet two times a day as needed for nausea or vomiting.  30 tablet  1  . polyethylene glycol powder (GLYCOLAX/MIRALAX) powder Take 17 g by mouth as needed.       . prochlorperazine (COMPAZINE) 10 MG tablet Take 1 tablet (10 mg total) by mouth every 6 (six) hours as needed (Nausea or vomiting).  30 tablet  1  . prochlorperazine (COMPAZINE) 25 MG suppository Place 1 suppository (25 mg total) rectally every 12 (twelve) hours as needed for nausea.  12 suppository  3    SURGICAL HISTORY:  Past Surgical History  Procedure Date  . Mastectomy 06/18/09    RT  . Cholecystectomy   . Laparoscopy     salpingectomy  . Eye surgery   . Nasal septum surgery     deviated septum  . Breast lumpectomy 06/05/00    right, ER/PR +, HER 2-  . Axillary node dissection 04/29/12    right, 0/4 nodes pos, mass bx- met adenocarcinoma  . Chest wall biopsy 04/07/12    soft tissue, right -metastatic adenocarcinoma, ER/PR +, HER2 -    REVIEW OF SYSTEMS:   General: fatigue (+), night sweats (-), fever (-), pain (-) Lymph: palpable nodes (-) HEENT: vision changes (-), mucositis (-), gum bleeding (-), epistaxis (-) Cardiovascular: chest pain (-), palpitations (-) Pulmonary: shortness of breath (-), dyspnea (-), cough (-), hemoptysis (-) GI:  Early satiety (-), melena (-), dysphagia (-), nausea/vomiting (-), diarrhea (-) GU:  dysuria (-), hematuria (-), incontinence (-) Musculoskeletal: joint swelling (-), joint pain (-), back pain (-) Neuro: weakness (-), numbness (-), headache (-), confusion (-) Skin: Rash (-), lesions (-), dryness (-) Psych: depression (-), suicidal/homicidal ideation (-), feeling of hopelessness (-)  PHYSICAL EXAMINATION:  BP 122/85  Pulse 85  Temp 98.7 F (37.1 C) (Oral)  Resp 20  Ht 5\' 7"  (1.702 m)  Wt 187 lb 3.2 oz (84.913 kg)  BMI 29.32 kg/m2 General: Patient is a well appearing female in no acute distress HEENT: PERRLA, sclerae anicteric no conjunctival pallor, MMM Neck: supple, no palpable adenopathy Lungs: clear to auscultation bilaterally, no wheezes, rhonchi, or rales Cardiovascular: regular rate rhythm, S1, S2, no murmurs, rubs or gallops Abdomen: Soft, non-tender, non-distended, normoactive bowel sounds, no HSM Extremities: warm and well perfused, no clubbing, cyanosis, or edema  Skin: No rashes or lesions Neuro: Non-focal Breast: Right chest wall looks well healed, no evidence of local recurrence  ECOG PERFORMANCE STATUS: 0 - Asymptomatic    LABORATORY DATA: Lab Results  Component Value Date   WBC 4.1 09/02/2012   HGB 11.6 09/02/2012   HCT 35.0 09/02/2012   MCV 87.5 09/02/2012   PLT 247 09/02/2012      Chemistry      Component Value Date/Time   NA 139 09/02/2012 0859   NA 138 07/22/2012 0851   K 3.8 09/02/2012 0859   K 4.0 07/22/2012 0851   CL 105 09/02/2012 0859   CL 104 07/22/2012 0851   CO2 24 09/02/2012 0859   CO2 27 07/22/2012 0851   BUN 8.0 09/02/2012 0859   BUN 7 07/22/2012 0851   CREATININE 0.8 09/02/2012 0859   CREATININE 0.92 07/22/2012 0851      Component Value Date/Time   CALCIUM 9.1 09/02/2012 0859   CALCIUM 9.1 07/22/2012 0851   ALKPHOS 80 09/02/2012 0859   ALKPHOS 66 07/22/2012 0851   AST 34 09/02/2012 0859   AST 28 07/22/2012 0851   ALT 40 09/02/2012 0859   ALT 36* 07/22/2012 0851   BILITOT 0.40 09/02/2012 0859   BILITOT 0.3 07/22/2012 0851        RADIOGRAPHIC STUDIES:  No results found.  ASSESSMENT: 41 year old female with:  1.  history of 2 primary breast cancers in the same breast. She has undergone a mastectomy. Patient now with recurrent breast cancer 2 possibly the right. Lymph nodes and chest wall. patient now with recurrent breast cancer at that is ER positive PR positive likely her original disease back in 2001. Patient has had a right axillary lymph node dissection and removal of what looks like a Rotters node.  #2 patient has had second opinion at [facility] by Dr. Tomi Likens one of the medical oncologists there. Her recommendation was CMF combination chemotherapy followed by an antiestrogen therapy. I do concur with this. However I do think that we should give the CMF concurrently with her radiation therapy. Patient and I discussed this today. I will plan on giving her 2 cycles of CMF up front. Then she could proceed with getting cyclophosphamide and 5-FU concurrently with radiation therapy. Once she completes her radiation then we will continue the CMF combination. A total of 6 cycles is planned tentatively.  PLAN  #1 Pt is Cycle 5 day 8 of CMF.  She tolerated her chemo infusion well.  I continued to recommend exercise when able.  Will call the patient with lab results today.    #2 She will return on 10/24 for Cycle 6 CMF and 10/31 for labs.    All questions were answered. The patient knows to call the clinic with any problems, questions or concerns. We can certainly see the patient much sooner if necessary.  I spent >15 minutes counseling the patient face to face. The total time spent in the appointment was 30 minutes.    Cherie Ouch Lyn Hollingshead, NP Medical Oncology [facility] Phone: [phone] 09/09/2012, 9:15 AM

## 2012-09-09 NOTE — Patient Instructions (Addendum)
Doing well.  Will call you with lab results.   See you on 09/23/12.

## 2012-09-13 ENCOUNTER — Encounter: Payer: Self-pay | Admitting: Radiation Oncology

## 2012-09-14 ENCOUNTER — Ambulatory Visit
Admission: RE | Admit: 2012-09-14 | Discharge: 2012-09-14 | Disposition: A | Discharge status: Home / Self Care | Payer: Medicaid Other | Source: Ambulatory Visit | Attending: Radiation Oncology | Admitting: Radiation Oncology

## 2012-09-14 ENCOUNTER — Encounter: Payer: Self-pay | Admitting: Radiation Oncology

## 2012-09-14 VITALS — BP 121/75 | HR 81 | Temp 98.4°F | Resp 20 | Wt 187.3 lb

## 2012-09-14 DIAGNOSIS — C50919 Malignant neoplasm of unspecified site of unspecified female breast: Secondary | ICD-10-CM

## 2012-09-14 NOTE — Progress Notes (Signed)
F/u right breast rad txs:07/15/12-08/19/12, r axilla/clavicular region, well healed, alert,oriented x3, no c/o pain, eaing and drinking well, no new meds, slight fatigued still, chemotherapy last treatment Oct 24,2013,of cytoxan,45fu and back on methotrexate 10:21 AM  10:21 AM

## 2012-09-14 NOTE — Progress Notes (Signed)
Followup note: Breanna Lara returns today approximately 1 month following completion of radiation therapy in the management of her recurrent carcinoma the right breast involving subpectoral lymph nodes. She is without complaints today. She completed CMF this October 24. She plans on returning to work. She does report discomfort along the right clavicular region at the side of her treatment.  Physical examination: She is in good spirits. Wt Readings from Last 3 Encounters:  09/14/12 187 lb 4.8 oz (84.959 kg)  09/09/12 187 lb 3.2 oz (84.913 kg)  09/02/12 186 lb 11.2 oz (84.687 kg)   Temp Readings from Last 3 Encounters:  09/14/12 98.4 F (36.9 C) Oral  09/09/12 98.7 F (37.1 C) Oral  09/02/12 98.9 F (37.2 C) Oral   BP Readings from Last 3 Encounters:  09/14/12 121/75  09/09/12 122/85  09/02/12 119/79   Pulse Readings from Last 3 Encounters:  09/14/12 81  09/09/12 85  09/02/12 88   Head and neck examination: Grossly unremarkable. Nodes: Without palpable cervical, supraclavicular, or axillary lymphadenopathy. There is no residual erythema or radiation dermatitis along the right clavicular region. Chest: Lungs clear. Left breast without masses or lesions. Abdomen without hepatomegaly. Extremities: I believe she does has trace right upper extremity lymphedema.  Impression: Satisfactory progress.  Plan: She'll complete her chemotherapy on October 24 with Dr. Welton Flakes. I've not scheduled the patient for a formal followup visit, but I would be more than happy to see her again in the future should the need arise.

## 2012-09-16 ENCOUNTER — Other Ambulatory Visit: Payer: Medicaid Other | Admitting: Lab

## 2012-09-16 ENCOUNTER — Ambulatory Visit: Payer: Medicaid Other | Admitting: Adult Health

## 2012-09-17 ENCOUNTER — Telehealth: Payer: Self-pay | Admitting: *Deleted

## 2012-09-17 ENCOUNTER — Ambulatory Visit: Payer: Medicaid Other

## 2012-09-17 ENCOUNTER — Other Ambulatory Visit: Payer: Self-pay | Admitting: *Deleted

## 2012-09-17 DIAGNOSIS — R399 Unspecified symptoms and signs involving the genitourinary system: Secondary | ICD-10-CM

## 2012-09-17 MED ORDER — CIPROFLOXACIN HCL 500 MG PO TABS: ORAL_TABLET | ORAL | Status: DC

## 2012-09-17 NOTE — Telephone Encounter (Signed)
Please have patient come in an see one of [place] and get a U/A with culture and sensitivity

## 2012-09-17 NOTE — Telephone Encounter (Signed)
3rd call to pt.Called pt to verify message received. LMOVM pt to come in today for urine specimen. Lab closes at 4pm. Pt to call back with confirmation information received.

## 2012-09-17 NOTE — Telephone Encounter (Signed)
Pt called states " I will have my last chemo on 10/24 and would like my Port out the last week of October because I start a new job Nov 11. Also , when I pee I have a pain or burning at the end." Message forwarded to providers.

## 2012-09-17 NOTE — Telephone Encounter (Signed)
PLACED PATIENT ON LAB WALK IN AT 2:00PM PER THE DESK NURSE

## 2012-09-17 NOTE — Telephone Encounter (Signed)
Per, MD pt to come in and get u/a with culture and sensitivity. Called pt lmovm to come in for lab appt.  appt will be scheduled as walk in. Requested pt call back to confirm message received.

## 2012-09-17 NOTE — Telephone Encounter (Signed)
Called pt to verify message received. No answer.

## 2012-09-19 LAB — URINE CULTURE

## 2012-09-23 ENCOUNTER — Other Ambulatory Visit (HOSPITAL_BASED_OUTPATIENT_CLINIC_OR_DEPARTMENT_OTHER): Payer: Medicaid Other | Admitting: Lab

## 2012-09-23 ENCOUNTER — Ambulatory Visit (HOSPITAL_BASED_OUTPATIENT_CLINIC_OR_DEPARTMENT_OTHER): Payer: Medicaid Other

## 2012-09-23 ENCOUNTER — Ambulatory Visit (HOSPITAL_BASED_OUTPATIENT_CLINIC_OR_DEPARTMENT_OTHER): Payer: Medicaid Other | Admitting: Oncology

## 2012-09-23 VITALS — BP 133/87 | HR 91 | Temp 98.4°F | Resp 20 | Ht 67.0 in | Wt 188.1 lb

## 2012-09-23 DIAGNOSIS — C50919 Malignant neoplasm of unspecified site of unspecified female breast: Secondary | ICD-10-CM

## 2012-09-23 DIAGNOSIS — C773 Secondary and unspecified malignant neoplasm of axilla and upper limb lymph nodes: Secondary | ICD-10-CM

## 2012-09-23 DIAGNOSIS — N390 Urinary tract infection, site not specified: Secondary | ICD-10-CM

## 2012-09-23 DIAGNOSIS — Z5111 Encounter for antineoplastic chemotherapy: Secondary | ICD-10-CM

## 2012-09-23 LAB — COMPREHENSIVE METABOLIC PANEL (CC13)
ALT: 23 U/L (ref 0–55)
AST: 21 U/L (ref 5–34)
Albumin: 3.5 g/dL (ref 3.5–5.0)
BUN: 9 mg/dL (ref 7.0–26.0)
Calcium: 8.8 mg/dL (ref 8.4–10.4)
Chloride: 106 mEq/L (ref 98–107)
Potassium: 4 mEq/L (ref 3.5–5.1)
Sodium: 138 mEq/L (ref 136–145)
Total Protein: 6.3 g/dL — ABNORMAL LOW (ref 6.4–8.3)

## 2012-09-23 LAB — CBC WITH DIFFERENTIAL/PLATELET
BASO%: 2.5 % — ABNORMAL HIGH (ref 0.0–2.0)
HCT: 37.1 % (ref 34.8–46.6)
HGB: 12.1 g/dL (ref 11.6–15.9)
MCHC: 32.6 g/dL (ref 31.5–36.0)
MONO#: 0.9 10*3/uL (ref 0.1–0.9)
NEUT%: 39.5 % (ref 38.4–76.8)
WBC: 4.8 10*3/uL (ref 3.9–10.3)
lymph#: 1.4 10*3/uL (ref 0.9–3.3)

## 2012-09-23 MED ORDER — DEXAMETHASONE SODIUM PHOSPHATE 10 MG/ML IJ SOLN
10.0000 mg | Freq: Once | INTRAMUSCULAR | Status: AC
  Administered 2012-09-23: 10 mg via INTRAVENOUS

## 2012-09-23 MED ORDER — SODIUM CHLORIDE 0.9 % IJ SOLN
10.0000 mL | INTRAMUSCULAR | Status: DC | PRN
  Filled 2012-09-23: qty 10

## 2012-09-23 MED ORDER — METHOTREXATE SODIUM CHEMO INJECTION 25 MG/ML
40.0000 mg/m2 | Freq: Once | INTRAMUSCULAR | Status: AC
  Administered 2012-09-23: 80 mg via INTRAVENOUS
  Filled 2012-09-23: qty 3.2

## 2012-09-23 MED ORDER — ONDANSETRON 8 MG/50ML IVPB (CHCC)
8.0000 mg | Freq: Once | INTRAVENOUS | Status: AC
  Administered 2012-09-23: 8 mg via INTRAVENOUS

## 2012-09-23 MED ORDER — PROCHLORPERAZINE MALEATE 10 MG PO TABS: 10.0000 mg | ORAL_TABLET | Freq: Four times a day (QID) | ORAL | Status: DC | PRN

## 2012-09-23 MED ORDER — SODIUM CHLORIDE 0.9 % IV SOLN
600.0000 mg/m2 | Freq: Once | INTRAVENOUS | Status: AC
  Administered 2012-09-23: 1180 mg via INTRAVENOUS
  Filled 2012-09-23: qty 59

## 2012-09-23 MED ORDER — HEPARIN SOD (PORK) LOCK FLUSH 100 UNIT/ML IV SOLN
500.0000 [IU] | Freq: Once | INTRAVENOUS | Status: DC | PRN
  Filled 2012-09-23: qty 5

## 2012-09-23 MED ORDER — SODIUM CHLORIDE 0.9 % IV SOLN
Freq: Once | INTRAVENOUS | Status: AC
  Administered 2012-09-23: 11:00:00 via INTRAVENOUS

## 2012-09-23 MED ORDER — FLUOROURACIL CHEMO INJECTION 2.5 GM/50ML
600.0000 mg/m2 | Freq: Once | INTRAVENOUS | Status: AC
  Administered 2012-09-23: 1200 mg via INTRAVENOUS
  Filled 2012-09-23: qty 24

## 2012-09-23 NOTE — Patient Instructions (Addendum)
[facility] Discharge Instructions for Patients Receiving Chemotherapy  Today you received the following chemotherapy agents cytoxan, methotrexate, 5FU  To help prevent nausea and vomiting after your treatment, we encourage you to take your nausea medication if needed Begin taking it at 5pm and take it as often as prescribed for the next 48 hours as needed.   If you develop nausea and vomiting that is not controlled by your nausea medication, call the clinic. If it is after clinic hours your family physician or the after hours number for the clinic or go to the Emergency Department.   BELOW ARE SYMPTOMS THAT SHOULD BE REPORTED IMMEDIATELY:  *FEVER GREATER THAN 100.5 F  *CHILLS WITH OR WITHOUT FEVER  NAUSEA AND VOMITING THAT IS NOT CONTROLLED WITH YOUR NAUSEA MEDICATION  *UNUSUAL SHORTNESS OF BREATH  *UNUSUAL BRUISING OR BLEEDING  TENDERNESS IN MOUTH AND THROAT WITH OR WITHOUT PRESENCE OF ULCERS  *URINARY PROBLEMS  *BOWEL PROBLEMS  UNUSUAL RASH Items with * indicate a potential emergency and should be followed up as soon as possible.  One of the nurses will contact you 24 hours after your treatment. Please let the nurse know about any problems that you may have experienced. Feel free to call the clinic you have any questions or concerns. The clinic phone number is [phone].   I have been informed and understand all the instructions given to me. I know to contact the clinic, my physician, or go to the Emergency Department if any problems should occur. I do not have any questions at this time, but understand that I may call the clinic during office hours   should I have any questions or need assistance in obtaining follow up care.    __________________________________________  _____________  __________ Signature of Patient or Authorized Representative            Date                   Time    __________________________________________ Nurse's  Signature

## 2012-09-23 NOTE — Patient Instructions (Addendum)
Proceed with your chemotherapy today CONGRATULATIONS!!!!!  I will see you back in 1 week

## 2012-09-27 ENCOUNTER — Encounter (HOSPITAL_COMMUNITY): Payer: Self-pay | Admitting: Pharmacy Technician

## 2012-09-28 ENCOUNTER — Other Ambulatory Visit: Payer: Self-pay | Admitting: Radiology

## 2012-09-30 ENCOUNTER — Telehealth: Payer: Self-pay | Admitting: *Deleted

## 2012-09-30 ENCOUNTER — Ambulatory Visit (HOSPITAL_BASED_OUTPATIENT_CLINIC_OR_DEPARTMENT_OTHER): Payer: Medicaid Other | Admitting: Oncology

## 2012-09-30 ENCOUNTER — Other Ambulatory Visit: Payer: Self-pay | Admitting: Oncology

## 2012-09-30 ENCOUNTER — Encounter: Payer: Self-pay | Admitting: Oncology

## 2012-09-30 ENCOUNTER — Ambulatory Visit (HOSPITAL_COMMUNITY)
Admission: RE | Admit: 2012-09-30 | Discharge: 2012-09-30 | Disposition: A | Discharge status: Home / Self Care | Payer: Medicaid Other | Source: Ambulatory Visit | Attending: Oncology | Admitting: Oncology

## 2012-09-30 ENCOUNTER — Other Ambulatory Visit (HOSPITAL_BASED_OUTPATIENT_CLINIC_OR_DEPARTMENT_OTHER): Payer: Medicaid Other | Admitting: Lab

## 2012-09-30 VITALS — BP 114/78 | HR 89 | Resp 24

## 2012-09-30 VITALS — BP 120/83 | HR 87 | Temp 98.4°F | Resp 20 | Ht 67.0 in | Wt 183.1 lb

## 2012-09-30 DIAGNOSIS — C50919 Malignant neoplasm of unspecified site of unspecified female breast: Secondary | ICD-10-CM

## 2012-09-30 DIAGNOSIS — C773 Secondary and unspecified malignant neoplasm of axilla and upper limb lymph nodes: Secondary | ICD-10-CM

## 2012-09-30 DIAGNOSIS — C44599 Other specified malignant neoplasm of skin of other part of trunk: Secondary | ICD-10-CM

## 2012-09-30 DIAGNOSIS — N390 Urinary tract infection, site not specified: Secondary | ICD-10-CM

## 2012-09-30 LAB — COMPREHENSIVE METABOLIC PANEL (CC13)
AST: 15 U/L (ref 5–34)
BUN: 13 mg/dL (ref 7.0–26.0)
Calcium: 9.4 mg/dL (ref 8.4–10.4)
Chloride: 106 mEq/L (ref 98–107)
Creatinine: 0.8 mg/dL (ref 0.6–1.1)
Glucose: 99 mg/dl (ref 70–99)

## 2012-09-30 LAB — CBC WITH DIFFERENTIAL/PLATELET
Basophils Absolute: 0.1 10*3/uL (ref 0.0–0.1)
EOS%: 5.4 % (ref 0.0–7.0)
HCT: 32.3 % — ABNORMAL LOW (ref 34.8–46.6)
HGB: 11 g/dL — ABNORMAL LOW (ref 11.6–15.9)
MCH: 29.9 pg (ref 25.1–34.0)
MCV: 87.6 fL (ref 79.5–101.0)
NEUT%: 70.8 % (ref 38.4–76.8)
lymph#: 0.7 10*3/uL — ABNORMAL LOW (ref 0.9–3.3)

## 2012-09-30 MED ORDER — SODIUM CHLORIDE 0.9 % IV SOLN: INTRAVENOUS | Status: DC

## 2012-09-30 MED ORDER — CEFAZOLIN SODIUM-DEXTROSE 2-3 GM-% IV SOLR: 2.0000 g | Freq: Once | INTRAVENOUS | Status: DC

## 2012-09-30 MED ORDER — DIAZEPAM 5 MG PO TABS
ORAL_TABLET | ORAL | Status: AC
  Administered 2012-09-30: 10 mg via ORAL
  Filled 2012-09-30: qty 2

## 2012-09-30 MED ORDER — CEFAZOLIN SODIUM-DEXTROSE 2-3 GM-% IV SOLR
INTRAVENOUS | Status: AC
  Filled 2012-09-30: qty 50

## 2012-09-30 MED ORDER — TAMOXIFEN CITRATE 20 MG PO TABS: 20.0000 mg | ORAL_TABLET | Freq: Every day | ORAL | Status: DC

## 2012-09-30 MED ORDER — LIDOCAINE HCL 1 % IJ SOLN
INTRAMUSCULAR | Status: AC
  Filled 2012-09-30: qty 20

## 2012-09-30 NOTE — Procedures (Signed)
Interventional Radiology Procedure Note  Procedure: Successful removal of left IJ approach portacatheter.   Complications: None Recommendations: - Routine wound care - Ibuprofen PRN pain  Signed,  Sterling Big, MD Vascular & Interventional Radiologist [facility] Radiology

## 2012-09-30 NOTE — Progress Notes (Signed)
OFFICE PROGRESS NOTE    No primary provider on file. No primary provider on file.  DIAGNOSIS: 41 year old female with history of invasive ductal carcinoma of the right breast. She was originally diagnosed in June 2001 with a stage II breast cancer. At that time she underwent a lumpectomy followed by chemotherapy given adjuvantly consisting of Adriamycin and Taxotere following CALGB 16109 study. She then went on to receive 5 years of tamoxifen.  She was then diagnosed with a second cancer in the ipsilateral breast measuring 1-0.1 and 1.6 cm with LVI. It was triple negative. She underwent a mastectomy on 06/18/2009 followed by adjuvant chemotherapy consisting of Taxol and carboplatinum which she completed in December of 2010.  PRIOR THERAPY:  #1 stage II right breast cancer diagnosed 2001 status post lumpectomy followed by adjuvant chemotherapy consisting of Adriamycin and Cytoxan following CALGB 60454 study. She then received 5 years of tamoxifen.  #2 second cancer in the ipsilateral breast measuring 2.1 and 1.6 cm with LVI. ER negative PR negative HER-2/neu negative. Status post mastectomy 06/18/2009. She then received Taxol and carboplatinum from 08/16/2009 through 11/08/2009.  #3 the patient was recently seen at [facility] by her surgeon. She was noted to have some pain. She went on to have staging scans performed which revealed no evidence of disease. However on clinical exam patient apparently had a nodule in the right anterior chest wall. This has been biopsied and the pathology is consistent with a recurrent invasive ductal carcinoma that is ER positive PR positive.  #4 patient is now status post right axillary lymph node dissection for level II and 3 lymph nodes there was no evidence of malignancy in 4 lymph nodes. There was soft tissue taken called Aundria Rud node excision that showed 3.1 cm metastatic adenocarcinoma with perineural invasion apparently the tumor was ER positive and PR  positive. No lymph node tissue was identified possibly representing either an entirely replaced lymph node with extracapsular extension or a soft tissue metastasis.  #5 patient began chemotherapy consisting of CMF on 06/10/2012.  #6 Patient received radiation therapy from 07/15/12 to 08/19/12. Concurrently with radiation she received Cytoxan and 5-FU (cycles 2-4).  #7 Radiation completed,   #8. She now has completed 6 cycles of CMF completed on 09/23/12  #9. To begin tamoxifen adjuvantly beginning 09/30/12.  CURRENT THERAPY: Begin tamoxifen 20 mg daily on 09/30/12  INTERVAL HISTORY: Breanna Lara 41 y.o. female returns for followup visit today.  She is slightly fatigued, but is otherwise feeling okay. She also had diarrhea last week that lasted for a day and has since resolved.  Otherwise she is feeling well and without complaints.  Her husband has to be at work early today, so I saw her first, and she's getting her labs drawn second.      MEDICAL HISTORY: Past Medical History  Diagnosis Date  . Breast cancer 2001, 2010     S/P Rt mastectomy  BRAC neg  . Ectopic pregnancy, tubal 1998  . Irritable bowel syndrome   . Hemorrhoids   . History of toe surgery   . Anemia     H/O  . Blood transfusion, without reported diagnosis   . Genital HSV   . ASCUS (atypical squamous cells of undetermined significance) on Pap smear 2009    colpo  . Type o blood, rh negative   . Anxiety   . Hx of radiation therapy 01/04/01 - 02/19/01    right breast  . History of chemotherapy   .  History of radiation therapy 07/15/12-08/19/12    right/axilla/clavicular region 4500 cGy/25 sessions    ALLERGIES:  is allergic to percocet.  MEDICATIONS:  Current Outpatient Prescriptions  Medication Sig Dispense Refill  . cholecalciferol (VITAMIN D) 1000 UNITS tablet Take 1,000 Units by mouth daily.        . ciprofloxacin (CIPRO) 500 MG tablet Take 1 tablet 2(two) times daily for 5 days  10 tablet  0  .  dexamethasone (DECADRON) 4 MG tablet Take 2 tablets by mouth daily starting the day after chemotherapy for 2 days. Take with food.  30 tablet  1  . diphenhydrAMINE (BENADRYL) 25 mg capsule Take 25 mg by mouth every 6 (six) hours as needed. For chemo      . fexofenadine (ALLEGRA ALLERGY) 180 MG tablet Take 180 mg by mouth daily as needed. For allergies      . hydrocortisone-pramoxine (PROCTOFOAM-HC) rectal foam Place 1 applicator rectally 3 (three) times daily as needed.  10 g  1  . lidocaine-prilocaine (EMLA) cream Apply 1 application topically as needed. For port cath insertion      . LORazepam (ATIVAN) 0.5 MG tablet Take 0.5 mg by mouth every 6 (six) hours as needed. For nausea or vomiting      . Multiple Vitamin (MULTIVITAMIN WITH MINERALS) TABS Take 1 tablet by mouth daily.      . non-metallic deodorant Thornton Papas) MISC Apply 1 application topically daily as needed.      . polyethylene glycol powder (GLYCOLAX/MIRALAX) powder Take 17 g by mouth as needed.       . prochlorperazine (COMPAZINE) 10 MG tablet Take 10 mg by mouth every 6 (six) hours as needed. For nausea      . tamoxifen (NOLVADEX) 20 MG tablet Take 1 tablet (20 mg total) by mouth daily.  90 tablet  6   No current facility-administered medications for this visit.   Facility-Administered Medications Ordered in Other Visits  Medication Dose Route Frequency Provider Last Rate Last Dose  . 0.9 %  sodium chloride infusion   Intravenous Continuous Brayton El, PA      . ceFAZolin (ANCEF) IVPB 2 g/50 mL premix  2 g Intravenous Once Brayton El, PA        SURGICAL HISTORY:  Past Surgical History  Procedure Date  . Mastectomy 06/18/09    RT  . Cholecystectomy   . Laparoscopy     salpingectomy  . Eye surgery   . Nasal septum surgery     deviated septum  . Breast lumpectomy 06/05/00    right, ER/PR +, HER 2-  . Axillary node dissection 04/29/12    right, 0/4 nodes pos, mass bx- met adenocarcinoma  . Chest wall biopsy 04/07/12    soft  tissue, right -metastatic adenocarcinoma, ER/PR +, HER2 -    REVIEW OF SYSTEMS:   General: fatigue (+), night sweats (-), fever (-), pain (-) Lymph: palpable nodes (-) HEENT: vision changes (-), mucositis (-), gum bleeding (-), epistaxis (-) Cardiovascular: chest pain (-), palpitations (-) Pulmonary: shortness of breath (-), dyspnea (-), cough (-), hemoptysis (-) GI:  Early satiety (-), melena (-), dysphagia (-), nausea/vomiting (-), diarrhea (-) GU: dysuria (-), hematuria (-), incontinence (-) Musculoskeletal: joint swelling (-), joint pain (-), back pain (-) Neuro: weakness (-), numbness (-), headache (-), confusion (-) Skin: Rash (-), lesions (-), dryness (-) Psych: depression (-), suicidal/homicidal ideation (-), feeling of hopelessness (-)  PHYSICAL EXAMINATION:  BP 120/83  Pulse 87  Temp 98.4 F (36.9 C) (  Oral)  Resp 20  Ht 5\' 7"  (1.702 m)  Wt 183 lb 1.6 oz (83.054 kg)  BMI 28.68 kg/m2 General: Patient is a well appearing female in no acute distress HEENT: PERRLA, sclerae anicteric no conjunctival pallor, MMM Neck: supple, no palpable adenopathy Lungs: clear to auscultation bilaterally, no wheezes, rhonchi, or rales Cardiovascular: regular rate rhythm, S1, S2, no murmurs, rubs or gallops Abdomen: Soft, non-tender, non-distended, normoactive bowel sounds, no HSM Extremities: warm and well perfused, no clubbing, cyanosis, or edema Skin: No rashes or lesions Neuro: Non-focal Breast: Right chest wall looks well healed, no evidence of local recurrence  ECOG PERFORMANCE STATUS: 0 - Asymptomatic    LABORATORY DATA: Lab Results  Component Value Date   WBC 3.6* 09/30/2012   HGB 11.0* 09/30/2012   HCT 32.3* 09/30/2012   MCV 87.6 09/30/2012   PLT 164 09/30/2012      Chemistry      Component Value Date/Time   NA 140 09/30/2012 1116   NA 138 07/22/2012 0851   K 4.2 09/30/2012 1116   K 4.0 07/22/2012 0851   CL 106 09/30/2012 1116   CL 104 07/22/2012 0851   CO2 28  09/30/2012 1116   CO2 27 07/22/2012 0851   BUN 13.0 09/30/2012 1116   BUN 7 07/22/2012 0851   CREATININE 0.8 09/30/2012 1116   CREATININE 0.92 07/22/2012 0851      Component Value Date/Time   CALCIUM 9.4 09/30/2012 1116   CALCIUM 9.1 07/22/2012 0851   ALKPHOS 58 09/30/2012 1116   ALKPHOS 66 07/22/2012 0851   AST 15 09/30/2012 1116   AST 28 07/22/2012 0851   ALT 16 09/30/2012 1116   ALT 36* 07/22/2012 0851   BILITOT 0.52 09/30/2012 1116   BILITOT 0.3 07/22/2012 0851       RADIOGRAPHIC STUDIES:  No results found.  ASSESSMENT: 41 year old female with:  1.  history of 2 primary breast cancers in the same breast. She has undergone a mastectomy. Patient now with recurrent breast cancer 2 possibly the right. Lymph nodes and chest wall. patient now with recurrent breast cancer at that is ER positive PR positive likely her original disease back in 2001. Patient has had a right axillary lymph node dissection and removal of what looks like a Rotters node.  #2 patient has had second opinion at [facility] by Dr. Tomi Likens one of the medical oncologists there. Her recommendation was CMF combination chemotherapy followed by an antiestrogen therapy. I do concur with this. However I do think that we should give the CMF concurrently with her radiation therapy. Patient and I discussed this today. I will plan on giving her 2 cycles of CMF up front. Then she could proceed with getting cyclophosphamide and 5-FU concurrently with radiation therapy. Once she completes her radiation then we will continue the CMF combination. She is now status post 6 cycles of CMF.  PLAN  #1Removal of port today  2. Begin Tamoxifen 20 mg daily.  3. We discussed bilateral oophorectomies today and she will discuss this with her OB.   4. She will be seen back in 3 months for follow up  All questions were answered. The patient knows to call the clinic with any problems, questions or concerns. We can certainly see the  patient much sooner if necessary.  I spent 25 minutes counseling the patient face to face. The total time spent in the appointment was 30 minutes.    Cherie Ouch Lyn Hollingshead, NP Medical Oncology [facility] Phone: [phone] 09/30/2012, 1:05 PM

## 2012-09-30 NOTE — Telephone Encounter (Signed)
Mailed out calendar to inform the patient of the new date and time 

## 2012-09-30 NOTE — Patient Instructions (Addendum)
Proceed with tamoxifen 20 mg daily  I will see you back in 3 months  Tamoxifen oral tablet What is this medicine? TAMOXIFEN (ta MOX i fen) blocks the effects of estrogen. It is commonly used to treat breast cancer. It is also used to decrease the chance of breast cancer coming back in women who have received treatment for the disease. It may also help prevent breast cancer in women who have a high risk of developing breast cancer. This medicine may be used for other purposes; ask your health care provider or pharmacist if you have questions. What should I tell my health care provider before I take this medicine? They need to know if you have any of these conditions: -blood clots -blood disease -cataracts or impaired eyesight -endometriosis -high calcium levels -high cholesterol -irregular menstrual cycles -liver disease -stroke -uterine fibroids -an unusual or allergic reaction to tamoxifen, other medicines, foods, dyes, or preservatives -pregnant or trying to get pregnant -breast-feeding How should I use this medicine? Take this medicine by mouth with a glass of water. Follow the directions on the prescription label. You can take it with or without food. Take your medicine at regular intervals. Do not take your medicine more often than directed. Do not stop taking except on your doctor's advice. A special MedGuide will be given to you by the pharmacist with each prescription and refill. Be sure to read this information carefully each time. Talk to your pediatrician regarding the use of this medicine in children. While this drug may be prescribed for selected conditions, precautions do apply. Overdosage: If you think you have taken too much of this medicine contact a poison control center or emergency room at once. NOTE: This medicine is only for you. Do not share this medicine with others. What if I miss a dose? If you miss a dose, take it as soon as you can. If it is almost time for  your next dose, take only that dose. Do not take double or extra doses. What may interact with this medicine? -aminoglutethimide -bromocriptine -chemotherapy drugs -female hormones, like estrogens and birth control pills -letrozole -medroxyprogesterone -phenobarbital -rifampin -warfarin This list may not describe all possible interactions. Give your health care provider a list of all the medicines, herbs, non-prescription drugs, or dietary supplements you use. Also tell them if you smoke, drink alcohol, or use illegal drugs. Some items may interact with your medicine. What should I watch for while using this medicine? Visit your doctor or health care professional for regular checks on your progress. You will need regular pelvic exams, breast exams, and mammograms. If you are taking this medicine to reduce your risk of getting breast cancer, you should know that this medicine does not prevent all types of breast cancer. If breast cancer or other problems occur, there is no guarantee that it will be found at an early stage. Do not become pregnant while taking this medicine or for 2 months after stopping this medicine. Stop taking this medicine if you get pregnant or think you are pregnant and contact your doctor. This medicine may harm your unborn baby. Women who can possibly become pregnant should use birth control methods that do not use hormones during tamoxifen treatment and for 2 months after therapy has stopped. Talk with your health care provider for birth control advice. Do not breast feed while taking this medicine. What side effects may I notice from receiving this medicine? Side effects that you should report to your doctor or health   care professional as soon as possible: -changes in vision (blurred vision) -changes in your menstrual cycle -difficulty breathing or shortness of breath -difficulty walking or talking -new breast lumps -numbness -pelvic pain or pressure -redness,  blistering, peeling or loosening of the skin, including inside the mouth -skin rash or itching (hives) -sudden chest pain -swelling of lips, face, or tongue -swelling, pain or tenderness in your calf or leg -unusual bruising or bleeding -vaginal discharge that is bloody, brown, or rust -weakness -yellowing of the whites of the eyes or skin Side effects that usually do not require medical attention (report to your doctor or health care professional if they continue or are bothersome): -fatigue -hair loss, although uncommon and is usually mild -headache -hot flashes -impotence (in men) -nausea, vomiting (mild) -vaginal discharge (white or clear) This list may not describe all possible side effects. Call your doctor for medical advice about side effects. You may report side effects to FDA at 1-800-FDA-1088. Where should I keep my medicine? Keep out of the reach of children. Store at room temperature between 20 and 25 degrees C (68 and 77 degrees F). Protect from light. Keep container tightly closed. Throw away any unused medicine after the expiration date. NOTE: This sheet is a summary. It may not cover all possible information. If you have questions about this medicine, talk to your doctor, pharmacist, or health care provider.  2012, [employer]. (08/03/2008 12:01:56 PM) 

## 2012-10-03 NOTE — Progress Notes (Signed)
OFFICE PROGRESS NOTE    No primary provider on file. No primary provider on file.  DIAGNOSIS: 41 year old female with history of invasive ductal carcinoma of the right breast. She was originally diagnosed in June 2001 with a stage II breast cancer. At that time she underwent a lumpectomy followed by chemotherapy given adjuvantly consisting of Adriamycin and Taxotere following CALGB 16109 study. She then went on to receive 5 years of tamoxifen.  She was then diagnosed with a second cancer in the ipsilateral breast measuring 1-0.1 and 1.6 cm with LVI. It was triple negative. She underwent a mastectomy on 06/18/2009 followed by adjuvant chemotherapy consisting of Taxol and carboplatinum which she completed in December of 2010.  PRIOR THERAPY:  #1 stage II right breast cancer diagnosed 2001 status post lumpectomy followed by adjuvant chemotherapy consisting of Adriamycin and Cytoxan following CALGB 60454 study. She then received 5 years of tamoxifen.  #2 second cancer in the ipsilateral breast measuring 2.1 and 1.6 cm with LVI. ER negative PR negative HER-2/neu negative. Status post mastectomy 06/18/2009. She then received Taxol and carboplatinum from 08/16/2009 through 11/08/2009.  #3 the patient was recently seen at [facility] by her surgeon. She was noted to have some pain. She went on to have staging scans performed which revealed no evidence of disease. However on clinical exam patient apparently had a nodule in the right anterior chest wall. This has been biopsied and the pathology is consistent with a recurrent invasive ductal carcinoma that is ER positive PR positive.  #4 patient is now status post right axillary lymph node dissection for level II and 3 lymph nodes there was no evidence of malignancy in 4 lymph nodes. There was soft tissue taken called Aundria Rud node excision that showed 3.1 cm metastatic adenocarcinoma with perineural invasion apparently the tumor was ER positive and PR  positive. No lymph node tissue was identified possibly representing either an entirely replaced lymph node with extracapsular extension or a soft tissue metastasis.  #5 patient began chemotherapy consisting of CMF on 06/10/2012.  #6 Patient received radiation therapy from 07/15/12 to 08/19/12. Concurrently with radiation she received Cytoxan and 5-FU (cycles 2-4).  #7 Radiation completed, CMF therapy beginning with cycle 5.  CURRENT THERAPY: Cycle 6 day 1 CMF  INTERVAL HISTORY: Breanna Lara 41 y.o. female returns for followup visit today.  She is slightly fatigued, but is otherwise feeling okay.  She does get a slight rash with the chemotherapy, but it went away with benadryl. Overall she is doing well she is very excited to be finishing up cycle 6 of her chemotherapy. Since her tumor was ER positive and she is premenopausal we will plan on putting her on tamoxifen When she comes which will be on October 31. Today she will proceed with her chemotherapy. She understands the risks and benefits. She does have fatigue otherwise no nausea vomiting fevers chills night sweats headaches shortness of breath chest pains or palpitations. Remainder of the 10 point review of systems is negative.   MEDICAL HISTORY: Past Medical History  Diagnosis Date  . Breast cancer 2001, 2010     S/P Rt mastectomy  BRAC neg  . Ectopic pregnancy, tubal 1998  . Irritable bowel syndrome   . Hemorrhoids   . History of toe surgery   . Anemia     H/O  . Blood transfusion, without reported diagnosis   . Genital HSV   . ASCUS (atypical squamous cells of undetermined significance) on Pap smear 2009  colpo  . Type o blood, rh negative   . Anxiety   . Hx of radiation therapy 01/04/01 - 02/19/01    right breast  . History of chemotherapy   . History of radiation therapy 07/15/12-08/19/12    right/axilla/clavicular region 4500 cGy/25 sessions    ALLERGIES:  is allergic to percocet.  MEDICATIONS:  Current Outpatient  Prescriptions  Medication Sig Dispense Refill  . cholecalciferol (VITAMIN D) 1000 UNITS tablet Take 1,000 Units by mouth daily.        . ciprofloxacin (CIPRO) 500 MG tablet Take 1 tablet 2(two) times daily for 5 days  10 tablet  0  . dexamethasone (DECADRON) 4 MG tablet Take 2 tablets by mouth daily starting the day after chemotherapy for 2 days. Take with food.  30 tablet  1  . hydrocortisone-pramoxine (PROCTOFOAM-HC) rectal foam Place 1 applicator rectally 3 (three) times daily as needed.  10 g  1  . non-metallic deodorant (ALRA) MISC Apply 1 application topically daily as needed.      . polyethylene glycol powder (GLYCOLAX/MIRALAX) powder Take 17 g by mouth as needed.       . diphenhydrAMINE (BENADRYL) 25 mg capsule Take 25 mg by mouth every 6 (six) hours as needed. For chemo      . fexofenadine (ALLEGRA ALLERGY) 180 MG tablet Take 180 mg by mouth daily as needed. For allergies      . lidocaine-prilocaine (EMLA) cream Apply 1 application topically as needed. For port cath insertion      . LORazepam (ATIVAN) 0.5 MG tablet Take 0.5 mg by mouth every 6 (six) hours as needed. For nausea or vomiting      . Multiple Vitamin (MULTIVITAMIN WITH MINERALS) TABS Take 1 tablet by mouth daily.      . prochlorperazine (COMPAZINE) 10 MG tablet Take 10 mg by mouth every 6 (six) hours as needed. For nausea      . tamoxifen (NOLVADEX) 20 MG tablet Take 1 tablet (20 mg total) by mouth daily.  90 tablet  6    SURGICAL HISTORY:  Past Surgical History  Procedure Date  . Mastectomy 06/18/09    RT  . Cholecystectomy   . Laparoscopy     salpingectomy  . Eye surgery   . Nasal septum surgery     deviated septum  . Breast lumpectomy 06/05/00    right, ER/PR +, HER 2-  . Axillary node dissection 04/29/12    right, 0/4 nodes pos, mass bx- met adenocarcinoma  . Chest wall biopsy 04/07/12    soft tissue, right -metastatic adenocarcinoma, ER/PR +, HER2 -    REVIEW OF SYSTEMS:   General: fatigue (+), night sweats  (-), fever (-), pain (-) Lymph: palpable nodes (-) HEENT: vision changes (-), mucositis (-), gum bleeding (-), epistaxis (-) Cardiovascular: chest pain (-), palpitations (-) Pulmonary: shortness of breath (-), dyspnea (-), cough (-), hemoptysis (-) GI:  Early satiety (-), melena (-), dysphagia (-), nausea/vomiting (-), diarrhea (-) GU: dysuria (-), hematuria (-), incontinence (-) Musculoskeletal: joint swelling (-), joint pain (-), back pain (-) Neuro: weakness (-), numbness (-), headache (-), confusion (-) Skin: Rash (-), lesions (-), dryness (-) Psych: depression (-), suicidal/homicidal ideation (-), feeling of hopelessness (-)  PHYSICAL EXAMINATION:  BP 133/87  Pulse 91  Temp 98.4 F (36.9 C) (Oral)  Resp 20  Ht 5\' 7"  (1.702 m)  Wt 188 lb 1.6 oz (85.322 kg)  BMI 29.46 kg/m2 General: Patient is a well appearing female in no acute  distress HEENT: PERRLA, sclerae anicteric no conjunctival pallor, MMM Neck: supple, no palpable adenopathy Lungs: clear to auscultation bilaterally, no wheezes, rhonchi, or rales Cardiovascular: regular rate rhythm, S1, S2, no murmurs, rubs or gallops Abdomen: Soft, non-tender, non-distended, normoactive bowel sounds, no HSM Extremities: warm and well perfused, no clubbing, cyanosis, or edema Skin: No rashes or lesions Neuro: Non-focal Breast: Right chest wall looks well healed, no evidence of local recurrence  ECOG PERFORMANCE STATUS: 0 - Asymptomatic    LABORATORY DATA: Lab Results  Component Value Date   WBC 3.6* 09/30/2012   HGB 11.0* 09/30/2012   HCT 32.3* 09/30/2012   MCV 87.6 09/30/2012   PLT 164 09/30/2012      Chemistry      Component Value Date/Time   NA 140 09/30/2012 1116   NA 138 07/22/2012 0851   K 4.2 09/30/2012 1116   K 4.0 07/22/2012 0851   CL 106 09/30/2012 1116   CL 104 07/22/2012 0851   CO2 28 09/30/2012 1116   CO2 27 07/22/2012 0851   BUN 13.0 09/30/2012 1116   BUN 7 07/22/2012 0851   CREATININE 0.8 09/30/2012 1116    CREATININE 0.92 07/22/2012 0851      Component Value Date/Time   CALCIUM 9.4 09/30/2012 1116   CALCIUM 9.1 07/22/2012 0851   ALKPHOS 58 09/30/2012 1116   ALKPHOS 66 07/22/2012 0851   AST 15 09/30/2012 1116   AST 28 07/22/2012 0851   ALT 16 09/30/2012 1116   ALT 36* 07/22/2012 0851   BILITOT 0.52 09/30/2012 1116   BILITOT 0.3 07/22/2012 0851       RADIOGRAPHIC STUDIES:  No results found.  ASSESSMENT: 41 year old female with:  1.  history of 2 primary breast cancers in the same breast. She has undergone a mastectomy. Patient now with recurrent breast cancer 2 possibly the right. Lymph nodes and chest wall. patient now with recurrent breast cancer at that is ER positive PR positive likely her original disease back in 2001. Patient has had a right axillary lymph node dissection and removal of what looks like a Rotters node.  #2 patient has had second opinion at [facility] by Dr. Tomi Likens one of the medical oncologists there. Her recommendation was CMF combination chemotherapy followed by an antiestrogen therapy. I do concur with this. However I do think that we should give the CMF concurrently with her radiation therapy. Patient and I discussed this today. I will plan on giving her 2 cycles of CMF up front. Then she could proceed with getting cyclophosphamide and 5-FU concurrently with radiation therapy. Once she completes her radiation then we will continue the CMF combination. A total of 6 cycles is planned tentatively.  PLAN #1 patient will proceed with cycle #6 day 1 of her chemotherapy. After this cycle we will plan on having her Port-A-Cath removed.  #2 she'll be seen back in one week's time for followup.  All questions were answered. The patient knows to call the clinic with any problems, questions or concerns. We can certainly see the patient much sooner if necessary.  I spent >15 minutes counseling the patient face to face. The total time spent in the appointment was 30  minutes.    Cherie Ouch Lyn Hollingshead, NP Medical Oncology [facility] Phone: [phone] 10/03/2012, 5:02 PM

## 2012-10-12 ENCOUNTER — Ambulatory Visit (INDEPENDENT_AMBULATORY_CARE_PROVIDER_SITE_OTHER): Payer: Medicaid Other | Admitting: Obstetrics & Gynecology

## 2012-10-12 ENCOUNTER — Other Ambulatory Visit (HOSPITAL_COMMUNITY)
Admission: RE | Admit: 2012-10-12 | Discharge: 2012-10-12 | Disposition: A | Discharge status: Home / Self Care | Payer: Medicaid Other | Source: Ambulatory Visit | Attending: Obstetrics & Gynecology | Admitting: Obstetrics & Gynecology

## 2012-10-12 ENCOUNTER — Encounter: Payer: Self-pay | Admitting: Obstetrics & Gynecology

## 2012-10-12 VITALS — BP 100/80 | HR 72 | Temp 97.3°F | Resp 16 | Ht 66.0 in | Wt 189.0 lb

## 2012-10-12 DIAGNOSIS — Z1151 Encounter for screening for human papillomavirus (HPV): Secondary | ICD-10-CM | POA: Insufficient documentation

## 2012-10-12 DIAGNOSIS — Z01419 Encounter for gynecological examination (general) (routine) without abnormal findings: Secondary | ICD-10-CM

## 2012-10-12 DIAGNOSIS — Z23 Encounter for immunization: Secondary | ICD-10-CM

## 2012-10-12 MED ORDER — INFLUENZA VIRUS VACC SPLIT PF IM SUSP
0.5000 mL | Freq: Once | INTRAMUSCULAR | Status: AC
  Administered 2012-10-12: 0.5 mL via INTRAMUSCULAR

## 2012-10-12 NOTE — Progress Notes (Signed)
Subjective:     Breanna Lara is a 41 y.o. female here for a routine exam.  Current complaints: none, pt just finsihed chemo for recurrent breast cancer that is ER+ and PR+.  This is most likely a recurrence of the 2001 tumor as the last tumor was triple negative.  Pt is still menstruating and need either Lupron or bilateral oopherectomy.  Dr. Park Breed has sent her tome to discuss options.  While I am able to discuss side effects of Lupron and risks of oopherectomy, I am unable to advise if one is better than the other in terms of breast cancer recurrence and survival.  If one is more beneficial than another based on scientific data, then I would suggest the patient proceed with best option.  WE discussed menopausal symptoms and treatment at length today.   Personal health questionnaire reviewed: yes.   Gynecologic History Patient's last menstrual period was 08/31/2012. Contraception: condoms Last Pap: 2012. Results were: normal (HPV neg)   Obstetric History OB History    Grav Para Term Preterm Abortions TAB SAB Ect Mult Living   4 2   2 1  1  2      # Outc Date GA Lbr Len/2nd Wgt Sex Del Anes PTL Lv   1 PAR 9/96    F       2 ECT 1/99           3 TAB 12/03           4 PAR 8/10    F           The following portions of the patient's history were reviewed and updated as appropriate: allergies, current medications, past family history, past medical history, past social history, past surgical history and problem list.  Review of Systems Pertinent items are noted in HPI.    Objective:   Filed Vitals:   10/12/12 1533  BP: 100/80  Pulse: 72  Temp: 97.3 F (36.3 C)  TempSrc: Oral  Resp: 16  Height: 5\' 6"  (1.676 m)  Weight: 189 lb (85.73 kg)     Vitals:  WNL General appearance: alert, cooperative and no distress Head: Normocephalic, without obvious abnormality, atraumatic Eyes: negative Throat: lips, mucosa, and tongue normal; teeth and gums normal Lungs: clear to  auscultation bilaterally Breasts: right breast surgically absent.  No chest wall masses.  Left breast nml. Heart: regular rate and rhythm Abdomen: soft, non-tender; bowel sounds normal; no masses,  no organomegaly Pelvic: cervix normal in appearance, external genitalia normal, no adnexal masses or tenderness, no bladder tenderness, no cervical motion tenderness, perianal skin: no external genital warts noted, rectovaginal septum normal, urethra without abnormality or discharge, uterus normal size, shape, and consistency and vagina normal without discharge.  Uterus retroverted Extremities: no edema, redness or tenderness in the calves or thighs Skin: no lesions or rash Lymph nodes: Axillary adenopathy: none       Assessment:    Healthy female exam.  Breast cancer recurrent    Plan:    Education reviewed: calcium supplements, self breast exams and skin cancer screening. Contraception: coitus interruptus and condoms. Follow up in: 2 months. See HPI for discussion of oopherectomy.  Pt will follow up with oncologist to see if one method is better for breast cancer curvival over the other. Patient would not like to proceed until January with any treatment.  She has just fniished chemotherapy and would like to enjoy  he holidays.  She has also just returned  to work and would not like to take more time off right now.    Message sent to Dr. Welton Flakes via Shirleen Schirmer

## 2012-12-16 ENCOUNTER — Telehealth: Payer: Self-pay | Admitting: Medical Oncology

## 2012-12-16 NOTE — Telephone Encounter (Signed)
She can see Maralyn Sago or Efraim Kaufmann

## 2012-12-16 NOTE — Telephone Encounter (Signed)
Patient LVMOM requesting to see MD to evaluate a "red spot under my arm, no lump, but it is red and irritated and I would like Dr. Welton Flakes to evaluate it. I'm off work tomorrow, could Dr. Welton Flakes schedule me in?"  Will review with MD. Next scheduled appt 01/07/13 with lab/MD.

## 2012-12-20 ENCOUNTER — Telehealth: Payer: Self-pay | Admitting: Oncology

## 2012-12-20 NOTE — Telephone Encounter (Signed)
S/w mira the desk nurse regarding this pt's appt for 12/17/2012 which that date has already passed. Per mira to remove the pof from the work que.

## 2012-12-21 ENCOUNTER — Telehealth: Payer: Self-pay | Admitting: Medical Oncology

## 2012-12-21 NOTE — Telephone Encounter (Signed)
Have her seen by [facility]

## 2012-12-21 NOTE — Telephone Encounter (Signed)
Patient LVMOM requesting an appt for the "near future, late afternoon, latest as possible" regarding redness under her arm. Next appt 01/07/13 lab/MD. Will review with MD.

## 2012-12-22 ENCOUNTER — Telehealth: Payer: Self-pay | Admitting: Oncology

## 2012-12-22 ENCOUNTER — Encounter: Payer: Self-pay | Admitting: *Deleted

## 2012-12-22 ENCOUNTER — Telehealth: Payer: Self-pay | Admitting: *Deleted

## 2012-12-22 NOTE — Telephone Encounter (Signed)
Called pt to discuss coming in to be seen today at 2pm by Efraim Kaufmann, NP. Unable to reach pt lmovm to call back concerning appt. Onc tx sent.   Drue Second, MD 12/21/2012 4:51 PM Signed  Have her seen by [facility] Parents, Antonietta Barcelona, RN 12/21/2012 4:20 PM Signed  Patient [facility] requesting an appt for the "near future, late afternoon, latest as possible" regarding redness under her arm. Next appt 01/07/13 lab/MD. Will review with MD.

## 2012-12-22 NOTE — Telephone Encounter (Signed)
Returned pt's call re appt. Per 1/22 pof appt w/[facility]. Pt not available. S/w pt's mom re appt for 1/29 @ 3pm.

## 2012-12-29 ENCOUNTER — Encounter: Payer: Self-pay | Admitting: Gynecologic Oncology

## 2012-12-29 ENCOUNTER — Ambulatory Visit (HOSPITAL_BASED_OUTPATIENT_CLINIC_OR_DEPARTMENT_OTHER): Payer: Managed Care, Other (non HMO) | Admitting: Gynecologic Oncology

## 2012-12-29 ENCOUNTER — Telehealth: Payer: Self-pay | Admitting: Oncology

## 2012-12-29 DIAGNOSIS — C773 Secondary and unspecified malignant neoplasm of axilla and upper limb lymph nodes: Secondary | ICD-10-CM

## 2012-12-29 DIAGNOSIS — R21 Rash and other nonspecific skin eruption: Secondary | ICD-10-CM

## 2012-12-29 DIAGNOSIS — Z170 Estrogen receptor positive status [ER+]: Secondary | ICD-10-CM

## 2012-12-29 DIAGNOSIS — C50919 Malignant neoplasm of unspecified site of unspecified female breast: Secondary | ICD-10-CM

## 2012-12-29 MED ORDER — CEPHALEXIN 500 MG PO CAPS: 500.0000 mg | ORAL_CAPSULE | Freq: Three times a day (TID) | ORAL | Status: DC

## 2012-12-29 NOTE — Patient Instructions (Addendum)
Take Keflex three times a day for 10 days.  Please call if the area worsens.  Keep the area clean and dry and do not use anything on that area.  Follow up in 10 days with Dr. Welton Flakes.

## 2012-12-29 NOTE — Telephone Encounter (Signed)
Per 1/29 pof f/u w/[facility] in 10 days. Pt already on schedule for 2/7 and was given schedule.

## 2012-12-29 NOTE — Progress Notes (Signed)
OFFICE PROGRESS NOTE  PCP: The patient does not have a PCP at this time. GYN: Dr. Elsie Lincoln  DIAGNOSIS: 42 year old female with history of invasive ductal carcinoma of the right breast. She was originally diagnosed in June 2001 with a stage II breast cancer. At that time she underwent a lumpectomy followed by chemotherapy given adjuvantly consisting of Adriamycin and Taxotere following CALGB 16109 study. She then went on to receive 5 years of tamoxifen.  She was then diagnosed with a second cancer in the ipsilateral breast measuring 1-0.1 and 1.6 cm with LVI. It was triple negative. She underwent a mastectomy on 06/18/2009 followed by adjuvant chemotherapy consisting of Taxol and carboplatinum which she completed in December of 2010.  PRIOR THERAPY: #1 Stage II right breast cancer diagnosed 2001 status post lumpectomy followed by adjuvant chemotherapy consisting of Adriamycin and Cytoxan following CALGB 60454 study. She then received 5 years of tamoxifen.  #2 Second cancer in the ipsilateral breast measuring 2.1 and 1.6 cm with LVI. ER negative PR negative HER-2/neu negative. Status post mastectomy 06/18/2009. She then received Taxol and carboplatinum from 08/16/2009 through 11/08/2009.  #3 The patient was recently seen at [facility] by her surgeon. She was noted to have some pain. She went on to have staging scans performed which revealed no evidence of disease. However on clinical exam patient apparently had a nodule in the right anterior chest wall. This has been biopsied and the pathology is consistent with a recurrent invasive ductal carcinoma that is ER positive PR positive.  #4 Patient is now status post right axillary lymph node dissection for level II and 3 lymph nodes there was no evidence of malignancy in 4 lymph nodes. There was soft tissue taken called Aundria Rud node excision that showed 3.1 cm metastatic adenocarcinoma with perineural invasion apparently the tumor was ER positive  and PR positive. No lymph node tissue was identified possibly representing either an entirely replaced lymph node with extracapsular extension or a soft tissue metastasis.  #5 Patient began chemotherapy consisting of CMF on 06/10/2012.  #6 Patient received radiation therapy from 07/15/12 to 08/19/12. Concurrently with radiation she received Cytoxan and 5-FU (cycles 2-4).  #7 Radiation completed,   #8. She now has completed 6 cycles of CMF completed on 09/23/12  #9. Taking tamoxifen adjuvantly beginning 09/30/12.  CURRENT THERAPY: Tamoxifen 20 mg daily since 09/30/12  INTERVAL HISTORY: Breanna Lara 42 y.o. female returns for followup visit today with complaints about right underarm redness and tenderness for the past 2 weeks.  She reports that she switched to using Tom's brand deodorant one month ago instead of the non-metallic deodorant that was prescribed after radiation.  She states that 2 weeks ago she noticed redness under her right arm in the axilla.  She reports that the erythema improved after stopping the Tom's brand deodorant for two days but then the erythema returned and worsened with tenderness reported also.  She has been putting plain baking soda to the area.  Denies drainage, fever, or similar skin lesions anywhere else on the body.  She reports that since her last visit, she had her port removed and that she has returned to work, working 9 hours a day.  She still has not arranged for a BSO with Dr. Penne Lash because she wanted to discuss the situation with Dr. Welton Flakes again before proceeding.  She also reports that her last menstrual cycle was in October of 2013.  She has been tolerating Tamoxifen with occasional, tolerable hot flashes  reported.  Otherwise she is feeling well and without complaints.   MEDICAL HISTORY: Past Medical History  Diagnosis Date  . Breast cancer 2001, 2010     S/P Rt mastectomy  BRAC neg  . Ectopic pregnancy, tubal 1998  . Irritable bowel syndrome   .  Hemorrhoids   . History of toe surgery   . Anemia     H/O  . Blood transfusion, without reported diagnosis   . Genital HSV   . ASCUS (atypical squamous cells of undetermined significance) on Pap smear 2009    colpo  . Type o blood, rh negative   . Anxiety   . Hx of radiation therapy 01/04/01 - 02/19/01    right breast  . History of chemotherapy   . History of radiation therapy 07/15/12-08/19/12    right/axilla/clavicular region 4500 cGy/25 sessions    ALLERGIES:  is allergic to percocet.  MEDICATIONS:  Current Outpatient Prescriptions  Medication Sig Dispense Refill  . cholecalciferol (VITAMIN D) 1000 UNITS tablet Take 1,000 Units by mouth daily.        . fexofenadine (ALLEGRA ALLERGY) 180 MG tablet Take 180 mg by mouth daily as needed. For allergies      . hydrocortisone-pramoxine (PROCTOFOAM-HC) rectal foam Place 1 applicator rectally 3 (three) times daily as needed.  10 g  1  . Multiple Vitamin (MULTIVITAMIN WITH MINERALS) TABS Take 1 tablet by mouth daily.      . polyethylene glycol powder (GLYCOLAX/MIRALAX) powder Take 17 g by mouth as needed.       . tamoxifen (NOLVADEX) 20 MG tablet Take 1 tablet (20 mg total) by mouth daily.  90 tablet  6  . non-metallic deodorant (ALRA) MISC Apply 1 application topically daily as needed.        SURGICAL HISTORY:  Past Surgical History  Procedure Date  . Mastectomy 06/18/09    RT  . Cholecystectomy   . Laparoscopy     salpingectomy  . Eye surgery   . Nasal septum surgery     deviated septum  . Breast lumpectomy 06/05/00    right, ER/PR +, HER 2-  . Axillary node dissection 04/29/12    right, 0/4 nodes pos, mass bx- met adenocarcinoma  . Chest wall biopsy 04/07/12    soft tissue, right -metastatic adenocarcinoma, ER/PR +, HER2 -    REVIEW OF SYSTEMS:   Constitutional: Feels well.  Cardiovascular: No chest pain, shortness of breath, or edema.  Reporting mild lymphedema in the RUE.  Pulmonary: No cough or wheeze.  Gastrointestinal: No  nausea, vomiting, or diarrhea. No bright red blood per rectum or change in bowel movement.  Genitourinary: No frequency, urgency, or dysuria. No vaginal bleeding or discharge.  Musculoskeletal: No myalgia or joint pain. Neurologic: No weakness, numbness, or change in gait.  Psychology: No depression, anxiety, or insomnia.  PHYSICAL EXAMINATION:  General: Well appearing, well developed female in no acute distress HEENT: PERRLA, sclerae anicteric no conjunctival pallor, MMM. Oropharynx clear. Neck: Supple. No palpable adenopathy Lungs: Clear to auscultation bilaterally with no wheezes, rhonchi, or rales Cardiovascular: Regular rate rhythm, S1 and S2 normal, No murmurs, rubs or gallops Abdomen: Soft, non-tender, non-distended, normoactive bowel sounds, no HSM Extremities: No clubbing, cyanosis, or edema.  Mild edema noted in the RUE Skin:  Well demarcated, mildly erythematous, rash-like area noted to the right axilla.  Area in the shape of a teardrop.  Increased warmth with the area with no drainage or nodularity noted.  No other lesions noted. Neuro:  Non-focal Breast: Right chest wall looks well healed.  No evidence of local recurrence. No masses or nodularity noted.  ECOG PERFORMANCE STATUS: 1 - Symptomatic but completely ambulatory  LABORATORY DATA: Lab Results  Component Value Date   WBC 3.6* 09/30/2012   HGB 11.0* 09/30/2012   HCT 32.3* 09/30/2012   MCV 87.6 09/30/2012   PLT 164 09/30/2012      Chemistry      Component Value Date/Time   NA 140 09/30/2012 1116   NA 138 07/22/2012 0851   K 4.2 09/30/2012 1116   K 4.0 07/22/2012 0851   CL 106 09/30/2012 1116   CL 104 07/22/2012 0851   CO2 28 09/30/2012 1116   CO2 27 07/22/2012 0851   BUN 13.0 09/30/2012 1116   BUN 7 07/22/2012 0851   CREATININE 0.8 09/30/2012 1116   CREATININE 0.92 07/22/2012 0851      Component Value Date/Time   CALCIUM 9.4 09/30/2012 1116   CALCIUM 9.1 07/22/2012 0851   ALKPHOS 58 09/30/2012 1116   ALKPHOS  66 07/22/2012 0851   AST 15 09/30/2012 1116   AST 28 07/22/2012 0851   ALT 16 09/30/2012 1116   ALT 36* 07/22/2012 0851   BILITOT 0.52 09/30/2012 1116   BILITOT 0.3 07/22/2012 0851       RADIOGRAPHIC STUDIES:  No results found.  ASSESSMENT: 42 year old female with:  #1  History of 2 primary breast cancers in the same breast. She has undergone a mastectomy. Patient now with recurrent breast cancer 2 possibly the right. Lymph nodes and chest wall. patient now with recurrent breast cancer at that is ER positive PR positive likely her original disease back in 2001. Patient has had a right axillary lymph node dissection and removal of what looks like a Rotters node.  #2 Patient has had second opinion at [facility] by Dr. Tomi Likens one of the medical oncologists there. Her recommendation was CMF combination chemotherapy followed by an antiestrogen therapy. I do concur with this. However I do think that we should give the CMF concurrently with her radiation therapy. Patient and I discussed this today. I will plan on giving her 2 cycles of CMF up front. Then she could proceed with getting cyclophosphamide and 5-FU concurrently with radiation therapy. Once she completes her radiation then we will continue the CMF combination. She is now status post 6 cycles of CMF.  #3 New onset, rash-like area for the past two weeks to the right axilla after switching to Tom's deodorant one month ago.  PLAN 1. Begin Keflex 500 mg PO three times daily for ten days to hopefully treat the rash-like area to the right axilla.  Instructed to call the office if the area worsens before her follow up appointment in 10 days and to not put lotions or powders to the area.  2. Continue Tamoxifen 20 mg daily.  3. We discussed bilateral oophorectomies again today and she is to follow up with Dr. Penne Lash about arranging the procedure.   4. She will be seen back in 10 days for follow up to assess the right axilla for  improvement in erythema, tenderness, and increased warmth.  Instructed that if the area persists, she would need a referral to a dermatologist for a skin biopsy.  All questions were answered. The patient knows to call the clinic with any problems, questions or concerns. We can certainly see the patient much sooner if necessary.  I spent 20 minutes counseling the patient face to face and  Dr. Welton Flakes observed the right axilla and spoke with the patient for 10 minutes. The total time spent in the appointment was 30 minutes.    Warner Mccreedy, NP Medical Oncology [facility] Phone: [phone]  12/29/2012, 3:08 PM

## 2013-01-07 ENCOUNTER — Other Ambulatory Visit (HOSPITAL_BASED_OUTPATIENT_CLINIC_OR_DEPARTMENT_OTHER): Payer: Managed Care, Other (non HMO)

## 2013-01-07 ENCOUNTER — Telehealth: Payer: Self-pay | Admitting: Oncology

## 2013-01-07 ENCOUNTER — Encounter: Payer: Self-pay | Admitting: Oncology

## 2013-01-07 ENCOUNTER — Ambulatory Visit (HOSPITAL_BASED_OUTPATIENT_CLINIC_OR_DEPARTMENT_OTHER): Payer: Managed Care, Other (non HMO) | Admitting: Oncology

## 2013-01-07 VITALS — BP 123/84 | HR 79 | Temp 98.3°F | Resp 20 | Ht 66.0 in | Wt 187.1 lb

## 2013-01-07 DIAGNOSIS — C50919 Malignant neoplasm of unspecified site of unspecified female breast: Secondary | ICD-10-CM

## 2013-01-07 DIAGNOSIS — C773 Secondary and unspecified malignant neoplasm of axilla and upper limb lymph nodes: Secondary | ICD-10-CM

## 2013-01-07 DIAGNOSIS — Z170 Estrogen receptor positive status [ER+]: Secondary | ICD-10-CM

## 2013-01-07 LAB — CBC WITH DIFFERENTIAL/PLATELET
BASO%: 1 % (ref 0.0–2.0)
EOS%: 3.5 % (ref 0.0–7.0)
HCT: 34.7 % — ABNORMAL LOW (ref 34.8–46.6)
HGB: 12.1 g/dL (ref 11.6–15.9)
MCHC: 34.9 g/dL (ref 31.5–36.0)
MCV: 85.1 fL (ref 79.5–101.0)
NEUT#: 3.6 10*3/uL (ref 1.5–6.5)
Platelets: 238 10*3/uL (ref 145–400)
RDW: 13.4 % (ref 11.2–14.5)
lymph#: 2.1 10*3/uL (ref 0.9–3.3)

## 2013-01-07 LAB — COMPREHENSIVE METABOLIC PANEL (CC13)
ALT: 32 U/L (ref 0–55)
AST: 29 U/L (ref 5–34)
Albumin: 3.7 g/dL (ref 3.5–5.0)
Calcium: 9.1 mg/dL (ref 8.4–10.4)
Chloride: 103 mEq/L (ref 98–107)
Potassium: 4.1 mEq/L (ref 3.5–5.1)
Sodium: 140 mEq/L (ref 136–145)
Total Protein: 7.6 g/dL (ref 6.4–8.3)

## 2013-01-07 NOTE — Patient Instructions (Addendum)
I will see you back in 6 months 

## 2013-01-07 NOTE — Progress Notes (Signed)
OFFICE PROGRESS NOTE  PCP: The patient does not have a PCP at this time. GYN: Dr. Elsie Lincoln  DIAGNOSIS: 42 year old female with history of invasive ductal carcinoma of the right breast. She was originally diagnosed in June 2001 with a stage II breast cancer. At that time she underwent a lumpectomy followed by chemotherapy given adjuvantly consisting of Adriamycin and Taxotere following CALGB 33295 study. She then went on to receive 5 years of tamoxifen.  She was then diagnosed with a second cancer in the ipsilateral breast measuring 1-0.1 and 1.6 cm with LVI. It was triple negative. She underwent a mastectomy on 06/18/2009 followed by adjuvant chemotherapy consisting of Taxol and carboplatinum which she completed in December of 2010.  PRIOR THERAPY: #1 Stage II right breast cancer diagnosed 2001 status post lumpectomy followed by adjuvant chemotherapy consisting of Adriamycin and Cytoxan following CALGB 18841 study. She then received 5 years of tamoxifen.  #2 Second cancer in the ipsilateral breast measuring 2.1 and 1.6 cm with LVI. ER negative PR negative HER-2/neu negative. Status post mastectomy 06/18/2009. She then received Taxol and carboplatinum from 08/16/2009 through 11/08/2009.  #3 The patient was recently seen at [facility] by her surgeon. She was noted to have some pain. She went on to have staging scans performed which revealed no evidence of disease. However on clinical exam patient apparently had a nodule in the right anterior chest wall. This has been biopsied and the pathology is consistent with a recurrent invasive ductal carcinoma that is ER positive PR positive.  #4 Patient is now status post right axillary lymph node dissection for level II and 3 lymph nodes there was no evidence of malignancy in 4 lymph nodes. There was soft tissue taken called Aundria Rud node excision that showed 3.1 cm metastatic adenocarcinoma with perineural invasion apparently the tumor was ER positive  and PR positive. No lymph node tissue was identified possibly representing either an entirely replaced lymph node with extracapsular extension or a soft tissue metastasis.  #5 Patient began chemotherapy consisting of CMF on 06/10/2012.  #6 Patient received radiation therapy from 07/15/12 to 08/19/12. Concurrently with radiation she received Cytoxan and 5-FU (cycles 2-4).  #7 Radiation completed,   #8. She now has completed 6 cycles of CMF completed on 09/23/12  #9. Taking tamoxifen adjuvantly beginning 09/30/12.patient continues to be premenopausal and continues to menstruate.  CURRENT THERAPY: Tamoxifen 20 mg daily since 09/30/12  INTERVAL HISTORY: Breanna Lara 42 y.o. female returns for followup visit today . She was seen 10 days ago with a rash under her right axilla. This is now resolved. She was treated with Keflex. She denies any fevers chills night sweats headaches shortness of breath chest pains or palpitations. Remainder of the 10 point review of systems is negative.  MEDICAL HISTORY: Past Medical History  Diagnosis Date  . Breast cancer 2001, 2010     S/P Rt mastectomy  BRAC neg  . Ectopic pregnancy, tubal 1998  . Irritable bowel syndrome   . Hemorrhoids   . History of toe surgery   . Anemia     H/O  . Blood transfusion, without reported diagnosis   . Genital HSV   . ASCUS (atypical squamous cells of undetermined significance) on Pap smear 2009    colpo  . Type o blood, rh negative   . Anxiety   . Hx of radiation therapy 01/04/01 - 02/19/01    right breast  . History of chemotherapy   . History of radiation therapy  07/15/12-08/19/12    right/axilla/clavicular region 4500 cGy/25 sessions    ALLERGIES:  is allergic to percocet.  MEDICATIONS:  Current Outpatient Prescriptions  Medication Sig Dispense Refill  . cephALEXin (KEFLEX) 500 MG capsule Take 1 capsule (500 mg total) by mouth 3 (three) times daily.  30 capsule  0  . cholecalciferol (VITAMIN D) 1000 UNITS  tablet Take 1,000 Units by mouth daily.        . fexofenadine (ALLEGRA ALLERGY) 180 MG tablet Take 180 mg by mouth daily as needed. For allergies      . hydrocortisone-pramoxine (PROCTOFOAM-HC) rectal foam Place 1 applicator rectally 3 (three) times daily as needed.  10 g  1  . Multiple Vitamin (MULTIVITAMIN WITH MINERALS) TABS Take 1 tablet by mouth daily.      . non-metallic deodorant Thornton Papas) MISC Apply 1 application topically daily as needed.      . tamoxifen (NOLVADEX) 20 MG tablet Take 1 tablet (20 mg total) by mouth daily.  90 tablet  6  . polyethylene glycol powder (GLYCOLAX/MIRALAX) powder Take 17 g by mouth as needed.         SURGICAL HISTORY:  Past Surgical History  Procedure Date  . Mastectomy 06/18/09    RT  . Cholecystectomy   . Laparoscopy     salpingectomy  . Eye surgery   . Nasal septum surgery     deviated septum  . Breast lumpectomy 06/05/00    right, ER/PR +, HER 2-  . Axillary node dissection 04/29/12    right, 0/4 nodes pos, mass bx- met adenocarcinoma  . Chest wall biopsy 04/07/12    soft tissue, right -metastatic adenocarcinoma, ER/PR +, HER2 -    REVIEW OF SYSTEMS:   Constitutional: Feels well.  Cardiovascular: No chest pain, shortness of breath, or edema.  Reporting mild lymphedema in the RUE.  Pulmonary: No cough or wheeze.  Gastrointestinal: No nausea, vomiting, or diarrhea. No bright red blood per rectum or change in bowel movement.  Genitourinary: No frequency, urgency, or dysuria. No vaginal bleeding or discharge.  Musculoskeletal: No myalgia or joint pain. Neurologic: No weakness, numbness, or change in gait.  Psychology: No depression, anxiety, or insomnia.  PHYSICAL EXAMINATION:  General: Well appearing, well developed female in no acute distress HEENT: PERRLA, sclerae anicteric no conjunctival pallor, MMM. Oropharynx clear. Neck: Supple. No palpable adenopathy Lungs: Clear to auscultation bilaterally with no wheezes, rhonchi, or  rales Cardiovascular: Regular rate rhythm, S1 and S2 normal, No murmurs, rubs or gallops Abdomen: Soft, non-tender, non-distended, normoactive bowel sounds, no HSM Extremities: No clubbing, cyanosis, or edema.  Mild edema noted in the RUE Skin:  Well demarcated, mildly erythematous, rash-like area noted to the right axilla.  Area in the shape of a teardrop.  Increased warmth with the area with no drainage or nodularity noted.  No other lesions noted. Neuro: Non-focal Breast: Right chest wall looks well healed.  No evidence of local recurrence. No masses or nodularity noted.  ECOG PERFORMANCE STATUS: 1 - Symptomatic but completely ambulatory  LABORATORY DATA: Lab Results  Component Value Date   WBC 6.4 01/07/2013   HGB 12.1 01/07/2013   HCT 34.7* 01/07/2013   MCV 85.1 01/07/2013   PLT 238 01/07/2013      Chemistry      Component Value Date/Time   NA 140 09/30/2012 1116   NA 138 07/22/2012 0851   K 4.2 09/30/2012 1116   K 4.0 07/22/2012 0851   CL 106 09/30/2012 1116   CL 104  07/22/2012 0851   CO2 28 09/30/2012 1116   CO2 27 07/22/2012 0851   BUN 13.0 09/30/2012 1116   BUN 7 07/22/2012 0851   CREATININE 0.8 09/30/2012 1116   CREATININE 0.92 07/22/2012 0851      Component Value Date/Time   CALCIUM 9.4 09/30/2012 1116   CALCIUM 9.1 07/22/2012 0851   ALKPHOS 58 09/30/2012 1116   ALKPHOS 66 07/22/2012 0851   AST 15 09/30/2012 1116   AST 28 07/22/2012 0851   ALT 16 09/30/2012 1116   ALT 36* 07/22/2012 0851   BILITOT 0.52 09/30/2012 1116   BILITOT 0.3 07/22/2012 0851       RADIOGRAPHIC STUDIES:  No results found.  ASSESSMENT: 42 year old female with:  #1  History of 2 primary breast cancers in the same breast. She has undergone a mastectomy. Patient now with recurrent breast cancer 2 possibly the right. Lymph nodes and chest wall. patient now with recurrent breast cancer at that is ER positive PR positive likely her original disease back in 2001. Patient has had a right axillary lymph node  dissection and removal of what looks like a Rotters node.  #2 Patient has had second opinion at [facility] by Dr. Tomi Likens one of the medical oncologists there. Her recommendation was CMF combination chemotherapy followed by an antiestrogen therapy. I do concur with this. However I do think that we should give the CMF concurrently with her radiation therapy. Patient and I discussed this today. I will plan on giving her 2 cycles of CMF up front. Then she could proceed with getting cyclophosphamide and 5-FU concurrently with radiation therapy. Once she completes her radiation then we will continue the CMF combination. She is now status post 6 cycles of CMF.  #3 New onset, rash-like area for the past two weeks to the right axilla after switching to Tom's deodorant one month ago.patient was seen about 10 days ago. She was begun on Keflex. Her rash has now improved.  #4 patient will continue tamoxifen 20 mg on a daily basis.  PLAN 1.continue tamoxifen daily.  #2 patient will discuss bilateral oophorectomies with Dr. Penne Lash. Patient wants to hold off on this until sometime in the summer.  3.she'll be seen back in July 2014 for followup care  All questions were answered. The patient knows to call the clinic with any problems, questions or concerns. We can certainly see the patient much sooner if necessary.  I spent 20 minutes counseling the patient face to face and Dr. Welton Flakes observed the right axilla and spoke with the patient for 10 minutes. The total time spent in the appointment was 30 minutes.   Drue Second, MD Medical/Oncology [facility] [phone] (beeper) [phone] (Office)  01/07/2013, 12:12 PM

## 2013-01-13 ENCOUNTER — Ambulatory Visit: Payer: Managed Care, Other (non HMO) | Admitting: Obstetrics & Gynecology

## 2013-02-01 ENCOUNTER — Ambulatory Visit (INDEPENDENT_AMBULATORY_CARE_PROVIDER_SITE_OTHER): Payer: Managed Care, Other (non HMO) | Admitting: Obstetrics & Gynecology

## 2013-02-01 ENCOUNTER — Encounter: Payer: Self-pay | Admitting: Obstetrics & Gynecology

## 2013-02-01 VITALS — BP 123/85 | HR 89 | Resp 16 | Ht 67.0 in | Wt 188.0 lb

## 2013-02-01 DIAGNOSIS — Z01818 Encounter for other preprocedural examination: Secondary | ICD-10-CM

## 2013-02-02 ENCOUNTER — Telehealth: Payer: Self-pay | Admitting: *Deleted

## 2013-02-02 NOTE — Progress Notes (Signed)
  Subjective:    Patient ID: Breanna Lara, female    DOB: 1971-01-30, 42 y.o.   MRN: [id number]  HPI  Pt is a breast cancer survivor.  She was first diagnosed with beast cancer in right breast i nher 20s.  She underwent lumpectomy, chemo, radiation.  Tumor was ER positive.  Pt was put on Tamoxifen.  During her last pregnancy she developed a second primary (triple negative) and underwent a mastectomy and dhemo.  Thre was a recent chest wall recurrence which required surgery, chemotherapy, and readiation.  Pt had her menses up until October 2013.Marland Kitchen  Last tumor is ER+.  Pt needs ovaries removed if not menopausal.  Pt has had ammenorrhea since October 2013.  We will check FSH and if in menopausal range I will discuss case with Dr. Park Breed, her medical oncologist.  Review of Systems Mild right arm sellin    Objective:   Physical Exam  Vitals reviewed. Constitutional: She appears well-developed and well-nourished.  Pulmonary/Chest: Effort normal.  Abdominal: She exhibits no distension. There is no tenderness. There is no rebound.          Assessment & Plan:  42 yo female with recurrent breast cancer; 1st adn 3rd occurrencs are ER positive.  1-Check FSH and discuss result with Dr. Park Breed 2-If surgery needed then will do [facility] with BSO. 3-Pt wants surgery in first week of may.

## 2013-02-02 NOTE — Telephone Encounter (Signed)
LM with pt's husband Alinda Money that her FSH was elevated but maybe due to being on Tamoxifen.  Dr Penne Lash spoke with [facility] who recommended that pt go ahead with supracervial hysterectomy and bilat oophorectomy .

## 2013-02-03 ENCOUNTER — Other Ambulatory Visit: Payer: Self-pay | Admitting: *Deleted

## 2013-02-03 MED ORDER — HYDROCORTISONE ACE-PRAMOXINE 1-1 % RE FOAM: 1.0000 | Freq: Three times a day (TID) | RECTAL | Status: DC | PRN

## 2013-02-03 NOTE — Telephone Encounter (Signed)
Pt called requesting RF on Proctofoam HC.  OK'd per Dr Penne Lash.   Sent to [facility].

## 2013-03-04 ENCOUNTER — Encounter (HOSPITAL_COMMUNITY): Payer: Self-pay

## 2013-03-04 ENCOUNTER — Encounter (HOSPITAL_COMMUNITY)
Admission: RE | Admit: 2013-03-04 | Discharge: 2013-03-04 | Disposition: A | Discharge status: Home / Self Care | Payer: Managed Care, Other (non HMO) | Source: Ambulatory Visit | Attending: Obstetrics & Gynecology | Admitting: Obstetrics & Gynecology

## 2013-03-04 LAB — CBC
Hemoglobin: 11.9 g/dL — ABNORMAL LOW (ref 12.0–15.0)
MCH: 28.7 pg (ref 26.0–34.0)
MCHC: 33.1 g/dL (ref 30.0–36.0)
Platelets: 233 10*3/uL (ref 150–400)

## 2013-03-04 LAB — SURGICAL PCR SCREEN: MRSA, PCR: NEGATIVE

## 2013-03-04 NOTE — Patient Instructions (Signed)
Your procedure is scheduled on:03/08/13  Enter through the Main Entrance at :0730 am Pick up desk phone and dial [phone] and inform us of your arrival.  Please call [phone] if you have any problems the morning of surgery.  Remember: Do not eat or drink after midnight: Monday  DO NOT wear jewelry, eye make-up, lipstick,body lotion, or dark fingernail polish. Do not shave for 48 hours prior to surgery.  If you are to be admitted after surgery, leave suitcase in car until your room has been assigned. Patients discharged on the day of surgery will not be allowed to drive home.

## 2013-03-08 ENCOUNTER — Encounter (HOSPITAL_COMMUNITY): Payer: Self-pay | Admitting: *Deleted

## 2013-03-08 ENCOUNTER — Ambulatory Visit (HOSPITAL_COMMUNITY)
Admission: RE | Admit: 2013-03-08 | Discharge: 2013-03-08 | Disposition: A | Discharge status: Home / Self Care | Payer: Managed Care, Other (non HMO) | Source: Ambulatory Visit | Attending: Obstetrics & Gynecology | Admitting: Obstetrics & Gynecology

## 2013-03-08 ENCOUNTER — Encounter (HOSPITAL_COMMUNITY): Payer: Self-pay | Admitting: Anesthesiology

## 2013-03-08 ENCOUNTER — Inpatient Hospital Stay (HOSPITAL_COMMUNITY): Payer: Managed Care, Other (non HMO) | Admitting: Anesthesiology

## 2013-03-08 ENCOUNTER — Encounter (HOSPITAL_COMMUNITY)
Admission: RE | Disposition: A | Discharge status: Home / Self Care | Payer: Self-pay | Source: Ambulatory Visit | Attending: Obstetrics & Gynecology

## 2013-03-08 DIAGNOSIS — Z170 Estrogen receptor positive status [ER+]: Secondary | ICD-10-CM

## 2013-03-08 DIAGNOSIS — Z4002 Encounter for prophylactic removal of ovary: Secondary | ICD-10-CM | POA: Insufficient documentation

## 2013-03-08 DIAGNOSIS — C50919 Malignant neoplasm of unspecified site of unspecified female breast: Secondary | ICD-10-CM | POA: Insufficient documentation

## 2013-03-08 HISTORY — PX: LAPAROSCOPIC BILATERAL SALPINGO OOPHERECTOMY: SHX5890

## 2013-03-08 LAB — PREGNANCY, URINE: Preg Test, Ur: NEGATIVE

## 2013-03-08 LAB — TYPE AND SCREEN
ABO/RH(D): O NEG
Antibody Screen: NEGATIVE

## 2013-03-08 LAB — CBC
Platelets: 228 10*3/uL (ref 150–400)
RBC: 4.23 MIL/uL (ref 3.87–5.11)
WBC: 6.6 10*3/uL (ref 4.0–10.5)

## 2013-03-08 SURGERY — Surgical Case
Anesthesia: *Unknown

## 2013-03-08 SURGERY — SALPINGO-OOPHORECTOMY, BILATERAL, LAPAROSCOPIC
Anesthesia: General | Laterality: Bilateral | Wound class: Clean Contaminated

## 2013-03-08 MED ORDER — GLYCOPYRROLATE 0.2 MG/ML IJ SOLN
INTRAMUSCULAR | Status: DC | PRN
  Administered 2013-03-08: 0.2 mg via INTRAVENOUS

## 2013-03-08 MED ORDER — LIDOCAINE HCL (CARDIAC) 20 MG/ML IV SOLN
INTRAVENOUS | Status: DC | PRN
  Administered 2013-03-08: 50 mg via INTRAVENOUS

## 2013-03-08 MED ORDER — KETOROLAC TROMETHAMINE 30 MG/ML IJ SOLN
INTRAMUSCULAR | Status: DC | PRN
  Administered 2013-03-08: 30 mg via INTRAVENOUS

## 2013-03-08 MED ORDER — ONDANSETRON HCL 4 MG/2ML IJ SOLN
INTRAMUSCULAR | Status: DC | PRN
  Administered 2013-03-08: 4 mg via INTRAVENOUS

## 2013-03-08 MED ORDER — PROPOFOL 10 MG/ML IV EMUL
INTRAVENOUS | Status: AC
  Filled 2013-03-08: qty 20

## 2013-03-08 MED ORDER — KETOROLAC TROMETHAMINE 30 MG/ML IJ SOLN: 15.0000 mg | Freq: Once | INTRAMUSCULAR | Status: DC | PRN

## 2013-03-08 MED ORDER — LACTATED RINGERS IR SOLN
Status: DC | PRN
  Administered 2013-03-08: 3000 mL

## 2013-03-08 MED ORDER — GLYCOPYRROLATE 0.2 MG/ML IJ SOLN
INTRAMUSCULAR | Status: AC
  Filled 2013-03-08: qty 1

## 2013-03-08 MED ORDER — DEXAMETHASONE SODIUM PHOSPHATE 10 MG/ML IJ SOLN
INTRAMUSCULAR | Status: DC | PRN
  Administered 2013-03-08: 10 mg via INTRAVENOUS

## 2013-03-08 MED ORDER — NEOSTIGMINE METHYLSULFATE 1 MG/ML IJ SOLN
INTRAMUSCULAR | Status: DC | PRN
  Administered 2013-03-08: 1 mg via INTRAVENOUS

## 2013-03-08 MED ORDER — PROPOFOL 10 MG/ML IV EMUL
INTRAVENOUS | Status: DC | PRN
  Administered 2013-03-08: 170 mg via INTRAVENOUS

## 2013-03-08 MED ORDER — PROMETHAZINE HCL 25 MG/ML IJ SOLN: 6.2500 mg | INTRAMUSCULAR | Status: DC | PRN

## 2013-03-08 MED ORDER — IBUPROFEN 600 MG PO TABS: 600.0000 mg | ORAL_TABLET | Freq: Four times a day (QID) | ORAL | Status: DC | PRN

## 2013-03-08 MED ORDER — ROCURONIUM BROMIDE 100 MG/10ML IV SOLN
INTRAVENOUS | Status: DC | PRN
  Administered 2013-03-08: 10 mg via INTRAVENOUS
  Administered 2013-03-08: 20 mg via INTRAVENOUS

## 2013-03-08 MED ORDER — ONDANSETRON HCL 4 MG/2ML IJ SOLN
INTRAMUSCULAR | Status: AC
  Filled 2013-03-08: qty 2

## 2013-03-08 MED ORDER — CEFAZOLIN SODIUM-DEXTROSE 2-3 GM-% IV SOLR
INTRAVENOUS | Status: AC
  Filled 2013-03-08: qty 50

## 2013-03-08 MED ORDER — MIDAZOLAM HCL 2 MG/2ML IJ SOLN
INTRAMUSCULAR | Status: AC
  Filled 2013-03-08: qty 2

## 2013-03-08 MED ORDER — DEXAMETHASONE SODIUM PHOSPHATE 10 MG/ML IJ SOLN
INTRAMUSCULAR | Status: AC
  Filled 2013-03-08: qty 1

## 2013-03-08 MED ORDER — KETOROLAC TROMETHAMINE 30 MG/ML IJ SOLN
INTRAMUSCULAR | Status: AC
  Filled 2013-03-08: qty 1

## 2013-03-08 MED ORDER — MEPERIDINE HCL 25 MG/ML IJ SOLN: 6.2500 mg | INTRAMUSCULAR | Status: DC | PRN

## 2013-03-08 MED ORDER — CEFAZOLIN SODIUM-DEXTROSE 2-3 GM-% IV SOLR
2.0000 g | INTRAVENOUS | Status: AC
  Administered 2013-03-08: 2 g via INTRAVENOUS

## 2013-03-08 MED ORDER — INDIGOTINDISULFONATE SODIUM 8 MG/ML IJ SOLN
INTRAMUSCULAR | Status: AC
  Filled 2013-03-08: qty 5

## 2013-03-08 MED ORDER — FENTANYL CITRATE 0.05 MG/ML IJ SOLN: 25.0000 ug | INTRAMUSCULAR | Status: DC | PRN

## 2013-03-08 MED ORDER — FENTANYL CITRATE 0.05 MG/ML IJ SOLN
INTRAMUSCULAR | Status: AC
  Filled 2013-03-08: qty 5

## 2013-03-08 MED ORDER — HYDROCODONE-ACETAMINOPHEN 5-325 MG PO TABS: ORAL_TABLET | ORAL | Status: DC

## 2013-03-08 MED ORDER — BUPIVACAINE HCL (PF) 0.25 % IJ SOLN
INTRAMUSCULAR | Status: AC
  Filled 2013-03-08: qty 30

## 2013-03-08 MED ORDER — ROCURONIUM BROMIDE 50 MG/5ML IV SOLN
INTRAVENOUS | Status: AC
  Filled 2013-03-08: qty 1

## 2013-03-08 MED ORDER — FENTANYL CITRATE 0.05 MG/ML IJ SOLN
INTRAMUSCULAR | Status: DC | PRN
  Administered 2013-03-08: 100 ug via INTRAVENOUS
  Administered 2013-03-08: 50 ug via INTRAVENOUS

## 2013-03-08 MED ORDER — NEOSTIGMINE METHYLSULFATE 1 MG/ML IJ SOLN
INTRAMUSCULAR | Status: AC
  Filled 2013-03-08: qty 1

## 2013-03-08 MED ORDER — LACTATED RINGERS IV SOLN: INTRAVENOUS | Status: DC

## 2013-03-08 MED ORDER — BUPIVACAINE HCL (PF) 0.25 % IJ SOLN
INTRAMUSCULAR | Status: DC | PRN
  Administered 2013-03-08: 9 mL

## 2013-03-08 MED ORDER — LIDOCAINE HCL (CARDIAC) 20 MG/ML IV SOLN
INTRAVENOUS | Status: AC
  Filled 2013-03-08: qty 5

## 2013-03-08 MED ORDER — LACTATED RINGERS IV SOLN
INTRAVENOUS | Status: DC
  Administered 2013-03-08 (×2): via INTRAVENOUS

## 2013-03-08 SURGICAL SUPPLY — 38 items
ADH SKN CLS APL DERMABOND .7 (GAUZE/BANDAGES/DRESSINGS) ×3
BAG SPEC RTRVL LRG 6X4 10 (ENDOMECHANICALS) ×6
BLADE LAPAROSCOPIC MORCELL KIT (BLADE) IMPLANT
BLADE SURG 15 STRL LF C SS BP (BLADE) ×3 IMPLANT
BLADE SURG 15 STRL SS (BLADE) ×4
CABLE HIGH FREQUENCY MONO STRZ (ELECTRODE) IMPLANT
CHLORAPREP W/TINT 26ML (MISCELLANEOUS) ×4 IMPLANT
CLOTH BEACON ORANGE TIMEOUT ST (SAFETY) ×4 IMPLANT
CONT PATH 16OZ SNAP LID 3702 (MISCELLANEOUS) IMPLANT
DERMABOND ADVANCED (GAUZE/BANDAGES/DRESSINGS) ×1
DERMABOND ADVANCED .7 DNX12 (GAUZE/BANDAGES/DRESSINGS) ×2 IMPLANT
DRSG COVADERM PLUS 2X2 (GAUZE/BANDAGES/DRESSINGS) ×3 IMPLANT
EVACUATOR SMOKE 8.L (FILTER) ×4 IMPLANT
GLOVE BIO SURGEON STRL SZ7 (GLOVE) ×4 IMPLANT
GLOVE BIOGEL PI IND STRL 7.0 (GLOVE) ×3 IMPLANT
GLOVE BIOGEL PI INDICATOR 7.0 (GLOVE) ×1
GOWN PREVENTION PLUS LG XLONG (DISPOSABLE) ×12 IMPLANT
NEEDLE INSUFFLATION 120MM (ENDOMECHANICALS) ×3 IMPLANT
NS IRRIG 1000ML POUR BTL (IV SOLUTION) ×4 IMPLANT
PACK LAVH (CUSTOM PROCEDURE TRAY) ×4 IMPLANT
POUCH SPECIMEN RETRIEVAL 10MM (ENDOMECHANICALS) ×6 IMPLANT
SCALPEL HARMONIC ACE (MISCELLANEOUS) ×4 IMPLANT
SCISSORS LAP 5X35 DISP (ENDOMECHANICALS) IMPLANT
SEALER TISSUE G2 CVD JAW 35 (ENDOMECHANICALS) IMPLANT
SEALER TISSUE G2 CVD JAW 45CM (ENDOMECHANICALS)
SET CYSTO W/LG BORE CLAMP LF (SET/KITS/TRAYS/PACK) IMPLANT
SET IRRIG TUBING LAPAROSCOPIC (IRRIGATION / IRRIGATOR) ×4 IMPLANT
SUT VIC AB 3-0 X1 27 (SUTURE) ×4 IMPLANT
SUT VICRYL 0 UR6 27IN ABS (SUTURE) IMPLANT
SUT VICRYL RAPIDE 3 0 (SUTURE) ×4 IMPLANT
TOWEL OR 17X24 6PK STRL BLUE (TOWEL DISPOSABLE) ×8 IMPLANT
TRAY FOLEY CATH 14FR (SET/KITS/TRAYS/PACK) ×4 IMPLANT
TROCAR BALLN 12MMX100 BLUNT (TROCAR) IMPLANT
TROCAR OPTI TIP 5M 100M (ENDOMECHANICALS) ×3 IMPLANT
TROCAR XCEL NON-BLD 11X100MML (ENDOMECHANICALS) ×6 IMPLANT
TROCAR XCEL NON-BLD 5MMX100MML (ENDOMECHANICALS) ×3 IMPLANT
WARMER LAPAROSCOPE (MISCELLANEOUS) ×4 IMPLANT
WATER STERILE IRR 1000ML POUR (IV SOLUTION) ×4 IMPLANT

## 2013-03-08 NOTE — Anesthesia Preprocedure Evaluation (Signed)
Anesthesia Evaluation  Patient identified by MRN, date of birth, ID band Patient awake    Reviewed: Allergy & Precautions, H&P , NPO status , Patient's Chart, lab work & pertinent test results, reviewed documented beta blocker date and time   Airway Mallampati: I TM Distance: >3 FB Neck ROM: full    Dental no notable dental hx. (+) Teeth Intact   Pulmonary neg pulmonary ROS,    Pulmonary exam normal       Cardiovascular negative cardio ROS      Neuro/Psych negative neurological ROS     GI/Hepatic negative GI ROS, Neg liver ROS,   Endo/Other  negative endocrine ROS  Renal/GU negative Renal ROS  negative genitourinary   Musculoskeletal negative musculoskeletal ROS (+)   Abdominal Normal abdominal exam  (+)   Peds negative pediatric ROS (+)  Hematology negative hematology ROS (+)   Anesthesia Other Findings   Reproductive/Obstetrics negative OB ROS                           Anesthesia Physical Anesthesia Plan  ASA: II  Anesthesia Plan: General   Post-op Pain Management:    Induction: Intravenous  Airway Management Planned: Oral ETT  Additional Equipment:   Intra-op Plan:   Post-operative Plan: Extubation in OR  Informed Consent: I have reviewed the patients History and Physical, chart, labs and discussed the procedure including the risks, benefits and alternatives for the proposed anesthesia with the patient or authorized representative who has indicated his/her understanding and acceptance.   Dental Advisory Given  Plan Discussed with: CRNA and Surgeon  Anesthesia Plan Comments:         Anesthesia Quick Evaluation

## 2013-03-08 NOTE — Anesthesia Postprocedure Evaluation (Signed)
Anesthesia Post Note  Patient: Breanna Lara  Procedure(s) Performed: Procedure(s) (LRB): SALPINGO OOPHORECTOMY (Bilateral)  Anesthesia type: General  Patient location: PACU  Post pain: Pain level controlled  Post assessment: Post-op Vital signs reviewed  Last Vitals:  Filed Vitals:   03/08/13 1015  BP:   Pulse: 57  Temp:   Resp: 16    Post vital signs: Reviewed  Level of consciousness: sedated  Complications: No apparent anesthesia complications

## 2013-03-08 NOTE — Op Note (Signed)
Breanna Lara PROCEDURE DATE: 03/08/2013  PREOPERATIVE DIAGNOSIS: 42 year old female with hormone receptor positive breast cancer  POSTOPERATIVE DIAGNOSIS: The same  PROCEDURE: Laparoscopic bilateral salpingoophorectomy  SURGEON:  Dr. Elsie Lincoln  ASSISTANT: Dr. Nicholaus Bloom  ANESTHESIA:  General  INDICATIONS: 42 y.o. [id number] with aforementioned preoprative diagnoses here today for laparoscopic bilateral salpinoophorectomy.   Risks of surgery were discussed with the patient including but not limited to: bleeding which may require transfusion or reoperation; infection which may require antibiotics; injury to bowel, bladder, ureters or other surrounding organs; need for additional procedures including laparotomy; thromboembolic phenomenon, incisional problems and other postoperative/anesthesia complications. Written informed consent was obtained.    FINDINGS:  Small uterus, bilateral adnexa normal with out mass or adhesions  .  No evidence of endometriosis, adhesions or any other abdominal/pelvic abnormality.  Normal upper abdomen.  INTRAVENOUS FLUIDS: 1500 ml  ESTIMATED BLOOD LOSS: 20 ml  SPECIMENS:  Bilateral ovaries and fallopian tubes  COMPLICATIONS: None immediate  PROCEDURE IN DETAIL:  The patient received intravenous antibiotics and had sequential compression devices applied to her lower extremities while in the preoperative area.  She was then taken to the operating room where general anesthesia was administered and was found to be adequate.  She was placed in the dorsal lithotomy position, and was prepped and draped in a sterile manner.  A Foley catheter was inserted into her bladder and attached to constant drainage and a uterine manipulator was then advanced into the uterus .  After an adequate timeout was performed, attention was then turned to the patient's abdomen where a 10-mm skin incision was made in the umbilical fold.  The Veress needle was carefully introduced into the  peritoneal cavity through the abdominal wall.  Intraperitoneal placement was confirmed by drop in intraabdominal pressure with insufflation of carbon dioxide gas.  Adequate pneumoperitoneum was obtained, and the 10/11-mm trocar and sleeve were then advanced without difficulty into the abdomen where intraabdominal placement was confirmed by the laparoscope. A survey of the patient's pelvis and abdomen revealed the findings above.   A 5-mm lower quadrant port was placed on the right and a 10-mm lower quadrant port was placed on the left under direct visualization.   On the right side, the uteroovarian ligament was then clamped and transected with the Harmonic device.  The right infundibulopelvic ligament was also clamped and transected allowing for salpingooophorectomy.  Excellent hemostasis was noted. Attention was then turned to the left of the pelvis where the previous steps were used to detach the left adnexa from the surrounding structures.The specimen was then removed from the abdomen through the 10/11-mm port using an Endocatch bag, under direct visualization.  The operative site was surveyed, and it was found to be hemostatic.  No intraoperative injury to other surrounding organs was noted.  The abdomen was desufflated and all instruments were then removed from the patient's abdomen. The fascial incision of the umbilicus was closed with a 0 Vicryl figure of eight stitch.  All skin incisions were closed with 3-0 Vicryl subcuticular stitches and Dermabond.   The patient will be discharged to home as per PACU criteria.  Routine postoperative instructions given.  She was prescribed Percocet, Ibuprofen and Colace.  She will follow up in the clinic in 2-3 weeks for postoperative evaluation.

## 2013-03-08 NOTE — Transfer of Care (Signed)
Immediate Anesthesia Transfer of Care Note  Patient: Breanna Lara  Procedure(s) Performed: Procedure(s): SALPINGO OOPHORECTOMY (Bilateral)  Patient Location: PACU  Anesthesia Type:General  Level of Consciousness: awake  Airway & Oxygen Therapy: Patient Spontanous Breathing  Post-op Assessment: Report given to PACU RN  Post vital signs: stable  Filed Vitals:   03/08/13 0711  BP: 126/79  Pulse: 82  Temp: 36.8 C  Resp: 18    Complications: No apparent anesthesia complications

## 2013-03-08 NOTE — H&P (Addendum)
Breanna Lara is an 42 y.o. female with hormone receptor positive breast cancer who was menstruating until 6 months ago.  She is on Tamoxifen and being switched to Femara.  Pt need ovaries removed.  Pt would like to keep her uterus at this time.  We had extensive conversations about tamoxifen causing polyps and risk of endometrial hyperplasia.  Pt has nad not vaginal bleeding or spotting for >6 months.  She is having hot flashes and mild vaginal dryness.  Femara does not have the same risk of endometrial hyperplasia / polyps.    Pertinent Gynecological History: Menses: no menses x6 months Bleeding: none Contraception: none DES exposure: denies Last pap: nlml 2012 OB History: 2 NSVDs   Menstrual History: No LMP recorded.    Past Medical History  Diagnosis Date  . Ectopic pregnancy, tubal 1998  . Irritable bowel syndrome   . Hemorrhoids   . History of toe surgery   . Anemia     H/O  . Blood transfusion, without reported diagnosis   . Genital HSV   . ASCUS (atypical squamous cells of undetermined significance) on Pap smear 2009    colpo  . Type o blood, rh negative   . Anxiety   . Hx of radiation therapy 01/04/01 - 02/19/01    right breast  . History of chemotherapy   . History of radiation therapy 07/15/12-08/19/12    right/axilla/clavicular region 4500 cGy/25 sessions  . Breast cancer 2001, 2010, 2013     S/P Rt mastectomy  BRAC neg    Past Surgical History  Procedure Laterality Date  . Mastectomy  06/18/09    RT  . Cholecystectomy    . Laparoscopy      salpingectomy  . Eye surgery    . Nasal septum surgery      deviated septum  . Breast lumpectomy  06/05/00    right, ER/PR +, HER 2-  . Axillary node dissection  04/29/12    right, 0/4 nodes pos, mass bx- met adenocarcinoma  . Chest wall biopsy  04/07/12    soft tissue, right -metastatic adenocarcinoma, ER/PR +, HER2 -    Family History  Problem Relation Age of Onset  . Heart attack Father   . Hypertension Father   .  Diabetes type II Father   . Kidney disease Father   . Melanoma Father 20    malignant  . Hypertension Brother   . Hypertension Mother   . Thyroid disease Mother     hypothyroid  . Hypothyroidism Mother   . Thyroid disease Sister     hypothyroid  . Diabetes Sister   . Hypothyroidism Sister   . Cancer Paternal Grandfather     lung  . Cancer Maternal Grandmother     pancreatic  . Breast cancer Maternal Aunt 50    Social History:  reports that she has never smoked. She has never used smokeless tobacco. She reports that she does not drink alcohol or use illicit drugs.  Allergies:  Allergies  Allergen Reactions  . Percocet (Oxycodone-Acetaminophen) Nausea Only    Prescriptions prior to admission  Medication Sig Dispense Refill  . Probiotic Product (PROBIOTIC DAILY PO) Take 1 tablet by mouth daily.      . tamoxifen (NOLVADEX) 20 MG tablet Take 1 tablet (20 mg total) by mouth daily.  90 tablet  6  . cholecalciferol (VITAMIN D) 1000 UNITS tablet Take 1,000 Units by mouth daily.          Review of  Systems  Constitutional: Negative.   Respiratory: Negative.   Cardiovascular: Negative.   Gastrointestinal: Negative.   Genitourinary: Negative.   Musculoskeletal: Negative.   Endo/Heme/Allergies:       +hot flashes    Blood pressure 126/79, pulse 82, temperature 98.2 F (36.8 C), temperature source Oral, resp. rate 18, SpO2 100.00%. Physical Exam  Vitals reviewed. Constitutional: She is oriented to person, place, and time. She appears well-developed and well-nourished. No distress.  HENT:  Head: Normocephalic and atraumatic.  Mouth/Throat: No oropharyngeal exudate.  Eyes: Conjunctivae are normal.  Neck: Neck supple. No thyromegaly present.  Cardiovascular: Normal rate and regular rhythm.   Respiratory: Breath sounds normal.  GI: Soft. She exhibits no distension. There is no tenderness.  Neurological: She is alert and oriented to person, place, and time.  Skin: Skin is warm  and dry.  Psychiatric: She has a normal mood and affect.    Results for orders placed during the hospital encounter of 03/08/13 (from the past 24 hour(s))  PREGNANCY, URINE     Status: None   Collection Time    03/08/13  7:00 AM      Result Value Range   Preg Test, Ur NEGATIVE  NEGATIVE  CBC     Status: None   Collection Time    03/08/13  8:00 AM      Result Value Range   WBC 6.6  4.0 - 10.5 K/uL   RBC 4.23  3.87 - 5.11 MIL/uL   Hemoglobin 12.3  12.0 - 15.0 g/dL   HCT 16.1  09.6 - 04.5 %   MCV 87.2  78.0 - 100.0 fL   MCH 29.1  26.0 - 34.0 pg   MCHC 33.3  30.0 - 36.0 g/dL   RDW 40.9  81.1 - 91.4 %   Platelets 228  150 - 400 K/uL    No results found.  Assessment/Plan: 42 yo female with hormone receptor positive breast cancer.  Pt has given considerable thought as to having a hysterectomy vs bilateral salpingoophorectomy.  Pt would like to keep her uterus for sexual function and decrease risk of vaginal prolapse.  Pt understands that there is limited risk of further problems of with her uterus and could need a hysterectomy in the future.   Risks of surgery include but not limited to bleeding, infection, damage to intra abdominal organs, risk of laparotomy, DVT/PE, anesthesia risks.  Pt fully understands she will need pap smears in the future and if vaginal bleeding occurs, further workup and surgery may be necessary.  LEGGETT,KELLY H. 03/08/2013, 8:27 AM

## 2013-03-10 ENCOUNTER — Encounter (HOSPITAL_COMMUNITY): Payer: Self-pay | Admitting: Obstetrics & Gynecology

## 2013-03-10 ENCOUNTER — Encounter: Payer: Self-pay | Admitting: Obstetrics & Gynecology

## 2013-03-10 DIAGNOSIS — Z90722 Acquired absence of ovaries, bilateral: Secondary | ICD-10-CM | POA: Insufficient documentation

## 2013-03-15 ENCOUNTER — Telehealth: Payer: Self-pay | Admitting: *Deleted

## 2013-03-15 NOTE — Telephone Encounter (Signed)
Called pt to adv FMLA papers are ready to be picked up at front desk - [facility] 

## 2013-03-16 NOTE — Telephone Encounter (Signed)
Erroneous

## 2013-03-29 ENCOUNTER — Encounter: Payer: Self-pay | Admitting: Obstetrics & Gynecology

## 2013-03-29 ENCOUNTER — Ambulatory Visit (INDEPENDENT_AMBULATORY_CARE_PROVIDER_SITE_OTHER): Payer: Managed Care, Other (non HMO) | Admitting: Obstetrics & Gynecology

## 2013-03-29 VITALS — BP 120/82 | HR 80 | Resp 16 | Ht 67.0 in | Wt 185.0 lb

## 2013-03-29 DIAGNOSIS — N951 Menopausal and female climacteric states: Secondary | ICD-10-CM

## 2013-03-29 MED ORDER — CALCIUM CARBONATE 600 MG PO TABS: 600.0000 mg | ORAL_TABLET | Freq: Two times a day (BID) | ORAL | Status: AC

## 2013-03-29 NOTE — Progress Notes (Signed)
  Subjective:    Patient ID: Breanna Lara, female    DOB: 1971/11/16, 42 y.o.   MRN: [id number]  HPI  Pt is 3 weeks s/p bilateral oophorectomy for ER positive breast cancer, not in menopause.  Pt has had slight worsening of her hot flashes.  No bleeding or abdominal pain.  Pt to return to work May 5th  Review of Systems  Constitutional:       Hot flashes  Cardiovascular: Negative.   Gastrointestinal: Negative.   Genitourinary: Positive for vaginal bleeding. Negative for vaginal discharge.  Psychiatric/Behavioral: Negative.        Objective:   Physical Exam  Vitals reviewed. Constitutional: She appears well-developed and well-nourished. No distress.  HENT:  Head: Normocephalic and atraumatic.  Abdominal: Soft. She exhibits no distension. There is no tenderness.  Incisions well healed  Skin: Skin is warm and dry.  Psychiatric: She has a normal mood and affect.          Assessment & Plan:  42 yo female doing well post op from [facility]SO  1-calcium and vitamin D for bone protection 2-pap up to date until 2016 3-breasts followed by general surgeon.

## 2013-04-01 ENCOUNTER — Telehealth: Payer: Self-pay | Admitting: Medical Oncology

## 2013-04-01 NOTE — Telephone Encounter (Signed)
Patient LVMOM stating she had her ovaries removed (03/08/13) and is currently taking tamoxifen, "should I continue with the tamoxifen, or were we going to change to femara now that I had my ovaries removed?" Will review with MD.  Last office visity  With MD 01/07/13, next sched appt 06/10/13 lab/MD

## 2013-04-04 ENCOUNTER — Telehealth: Payer: Self-pay | Admitting: Medical Oncology

## 2013-04-04 NOTE — Telephone Encounter (Signed)
Per MD, returned pt's call, left mssg with pt's mother, pt to continue taking tamoxifen as prescribed and to f/u with lab/MD appt 06/10/13. Patient to call with questions or concerns.

## 2013-04-04 NOTE — Telephone Encounter (Signed)
Continue tamoxifen until she sees me in July

## 2013-04-05 ENCOUNTER — Encounter: Payer: Self-pay | Admitting: *Deleted

## 2013-05-20 NOTE — Telephone Encounter (Signed)
e

## 2013-06-10 ENCOUNTER — Ambulatory Visit (HOSPITAL_BASED_OUTPATIENT_CLINIC_OR_DEPARTMENT_OTHER): Payer: Managed Care, Other (non HMO) | Admitting: Oncology

## 2013-06-10 ENCOUNTER — Encounter: Payer: Self-pay | Admitting: Oncology

## 2013-06-10 ENCOUNTER — Telehealth: Payer: Self-pay | Admitting: Oncology

## 2013-06-10 ENCOUNTER — Other Ambulatory Visit (HOSPITAL_BASED_OUTPATIENT_CLINIC_OR_DEPARTMENT_OTHER): Payer: Managed Care, Other (non HMO)

## 2013-06-10 VITALS — BP 112/78 | HR 90 | Temp 98.6°F | Resp 20 | Ht 67.0 in | Wt 187.2 lb

## 2013-06-10 DIAGNOSIS — C44509 Unspecified malignant neoplasm of skin of other part of trunk: Secondary | ICD-10-CM

## 2013-06-10 DIAGNOSIS — C50919 Malignant neoplasm of unspecified site of unspecified female breast: Secondary | ICD-10-CM

## 2013-06-10 DIAGNOSIS — C50911 Malignant neoplasm of unspecified site of right female breast: Secondary | ICD-10-CM

## 2013-06-10 DIAGNOSIS — C44599 Other specified malignant neoplasm of skin of other part of trunk: Secondary | ICD-10-CM

## 2013-06-10 DIAGNOSIS — R21 Rash and other nonspecific skin eruption: Secondary | ICD-10-CM

## 2013-06-10 LAB — COMPREHENSIVE METABOLIC PANEL (CC13)
AST: 25 U/L (ref 5–34)
Albumin: 3.6 g/dL (ref 3.5–5.0)
Alkaline Phosphatase: 72 U/L (ref 40–150)
Calcium: 9 mg/dL (ref 8.4–10.4)
Chloride: 105 mEq/L (ref 98–109)
Potassium: 3.8 mEq/L (ref 3.5–5.1)
Sodium: 141 mEq/L (ref 136–145)
Total Protein: 7.3 g/dL (ref 6.4–8.3)

## 2013-06-10 LAB — CBC WITH DIFFERENTIAL/PLATELET
Basophils Absolute: 0.1 10*3/uL (ref 0.0–0.1)
EOS%: 4.2 % (ref 0.0–7.0)
Eosinophils Absolute: 0.3 10*3/uL (ref 0.0–0.5)
HGB: 11.3 g/dL — ABNORMAL LOW (ref 11.6–15.9)
MCH: 29.1 pg (ref 25.1–34.0)
MCV: 85.3 fL (ref 79.5–101.0)
MONO%: 7.4 % (ref 0.0–14.0)
NEUT#: 4.4 10*3/uL (ref 1.5–6.5)
RBC: 3.88 10*6/uL (ref 3.70–5.45)
RDW: 13.4 % (ref 11.2–14.5)
lymph#: 2.2 10*3/uL (ref 0.9–3.3)

## 2013-06-10 NOTE — Patient Instructions (Addendum)
Continue tamoxifen 20 mg daily  Bone density scan to evaluate for osteopenia  Mammogram of the left breast  I will see you back in 6 months

## 2013-06-10 NOTE — Telephone Encounter (Signed)
, °

## 2013-06-30 NOTE — Progress Notes (Signed)
OFFICE PROGRESS NOTE  PCP: The patient does not have a PCP at this time. GYN: Dr. Elsie Lincoln  DIAGNOSIS: 42 year old female with history of invasive ductal carcinoma of the right breast. She was originally diagnosed in June 2001 with a stage II breast cancer. At that time she underwent a lumpectomy followed by chemotherapy given adjuvantly consisting of Adriamycin and Taxotere following CALGB 16109 study. She then went on to receive 5 years of tamoxifen.  She was then diagnosed with a second cancer in the ipsilateral breast measuring 1-0.1 and 1.6 cm with LVI. It was triple negative. She underwent a mastectomy on 06/18/2009 followed by adjuvant chemotherapy consisting of Taxol and carboplatinum which she completed in December of 2010.  PRIOR THERAPY: #1 Stage II right breast cancer diagnosed 2001 status post lumpectomy followed by adjuvant chemotherapy consisting of Adriamycin and Cytoxan following CALGB 60454 study. She then received 5 years of tamoxifen.  #2 Second cancer in the ipsilateral breast measuring 2.1 and 1.6 cm with LVI. ER negative PR negative HER-2/neu negative. Status post mastectomy 06/18/2009. She then received Taxol and carboplatinum from 08/16/2009 through 11/08/2009.  #3 The patient was recently seen at [facility] by her surgeon. She was noted to have some pain. She went on to have staging scans performed which revealed no evidence of disease. However on clinical exam patient apparently had a nodule in the right anterior chest wall. This has been biopsied and the pathology is consistent with a recurrent invasive ductal carcinoma that is ER positive PR positive.  #4 Patient is now status post right axillary lymph node dissection for level II and 3 lymph nodes there was no evidence of malignancy in 4 lymph nodes. There was soft tissue taken called Aundria Rud node excision that showed 3.1 cm metastatic adenocarcinoma with perineural invasion apparently the tumor was ER positive  and PR positive. No lymph node tissue was identified possibly representing either an entirely replaced lymph node with extracapsular extension or a soft tissue metastasis.  #5 Patient began chemotherapy consisting of CMF on 06/10/2012.  #6 Patient received radiation therapy from 07/15/12 to 08/19/12. Concurrently with radiation she received Cytoxan and 5-FU (cycles 2-4).  #7 Radiation completed,   #8. She now has completed 6 cycles of CMF completed on 09/23/12  #9. Taking tamoxifen adjuvantly beginning 09/30/12.patient continues to be premenopausal and continues to menstruate.  #10 status post bilateral salpingo-oophorectomies  CURRENT THERAPY: Tamoxifen 20 mg daily since 09/30/12  INTERVAL HISTORY: Breanna Lara 42 y.o. female returns for followup visit today . Overall she is doing well and is without any complaints. She is fatigued. She has no fevers chills night sweats she does have hot flashes. She denies having any peripheral paresthesias no cough hemoptysis no shortness of breath or chest pains. She has not noticed any vaginal bleeding. MEDICAL HISTORY: Past Medical History  Diagnosis Date  . Ectopic pregnancy, tubal 1998  . Irritable bowel syndrome   . Hemorrhoids   . History of toe surgery   . Anemia     H/O  . Blood transfusion, without reported diagnosis   . Genital HSV   . ASCUS (atypical squamous cells of undetermined significance) on Pap smear 2009    colpo  . Type o blood, rh negative   . Anxiety   . Hx of radiation therapy 01/04/01 - 02/19/01    right breast  . History of chemotherapy   . History of radiation therapy 07/15/12-08/19/12    right/axilla/clavicular region 4500 cGy/25 sessions  .  Breast cancer 2001, 2010, 2013     S/P Rt mastectomy  BRAC neg    ALLERGIES:  is allergic to percocet.  MEDICATIONS:  Current Outpatient Prescriptions  Medication Sig Dispense Refill  . calcium carbonate (OS-CAL) 600 MG TABS Take 1 tablet (600 mg total) by mouth 2 (two)  times daily with a meal.  60 tablet  12  . cholecalciferol (VITAMIN D) 1000 UNITS tablet Take 1,000 Units by mouth daily.        . fexofenadine (ALLEGRA) 30 MG tablet Take 30 mg by mouth as needed.      Marland Kitchen ibuprofen (ADVIL,MOTRIN) 600 MG tablet Take 1 tablet (600 mg total) by mouth every 6 (six) hours as needed for pain.  30 tablet  0  . Probiotic Product (PROBIOTIC DAILY PO) Take 1 tablet by mouth daily.      . tamoxifen (NOLVADEX) 20 MG tablet Take 1 tablet (20 mg total) by mouth daily.  90 tablet  6   No current facility-administered medications for this visit.    SURGICAL HISTORY:  Past Surgical History  Procedure Laterality Date  . Mastectomy  06/18/09    RT  . Cholecystectomy    . Laparoscopy      salpingectomy  . Eye surgery    . Nasal septum surgery      deviated septum  . Breast lumpectomy  06/05/00    right, ER/PR +, HER 2-  . Axillary node dissection  04/29/12    right, 0/4 nodes pos, mass bx- met adenocarcinoma  . Chest wall biopsy  04/07/12    soft tissue, right -metastatic adenocarcinoma, ER/PR +, HER2 -  . Laparoscopic bilateral salpingo oopherectomy Bilateral 03/08/2013    Procedure: LAPAROSCOPIC BILATERAL SALPINGO OOPHORECTOMY;  Surgeon: Lesly Dukes, MD;  Location: [facility];  Service: Gynecology;  Laterality: Bilateral;    REVIEW OF SYSTEMS:   Constitutional: Feels well.  Cardiovascular: No chest pain, shortness of breath, or edema.  Reporting mild lymphedema in the RUE.  Pulmonary: No cough or wheeze.  Gastrointestinal: No nausea, vomiting, or diarrhea. No bright red blood per rectum or change in bowel movement.  Genitourinary: No frequency, urgency, or dysuria. No vaginal bleeding or discharge.  Musculoskeletal: No myalgia or joint pain. Neurologic: No weakness, numbness, or change in gait.  Psychology: No depression, anxiety, or insomnia.  PHYSICAL EXAMINATION:  General: Well appearing, well developed female in no acute distress HEENT: PERRLA, sclerae anicteric  no conjunctival pallor, MMM. Oropharynx clear. Neck: Supple. No palpable adenopathy Lungs: Clear to auscultation bilaterally with no wheezes, rhonchi, or rales Cardiovascular: Regular rate rhythm, S1 and S2 normal, No murmurs, rubs or gallops Abdomen: Soft, non-tender, non-distended, normoactive bowel sounds, no HSM Extremities: No clubbing, cyanosis, or edema.  Mild edema noted in the RUE Skin:  Well demarcated, mildly erythematous, rash-like area noted to the right axilla.  Area in the shape of a teardrop.  Increased warmth with the area with no drainage or nodularity noted.  No other lesions noted. Neuro: Non-focal Breast: Right chest wall looks well healed.  No evidence of local recurrence. No masses or nodularity noted.  ECOG PERFORMANCE STATUS: 1 - Symptomatic but completely ambulatory  LABORATORY DATA: Lab Results  Component Value Date   WBC 7.5 06/10/2013   HGB 11.3* 06/10/2013   HCT 33.1* 06/10/2013   MCV 85.3 06/10/2013   PLT 240 06/10/2013      Chemistry      Component Value Date/Time   NA 141 06/10/2013 1414  NA 138 07/22/2012 0851   K 3.8 06/10/2013 1414   K 4.0 07/22/2012 0851   CL 103 01/07/2013 1137   CL 104 07/22/2012 0851   CO2 27 06/10/2013 1414   CO2 27 07/22/2012 0851   BUN 12.1 06/10/2013 1414   BUN 7 07/22/2012 0851   CREATININE 0.9 06/10/2013 1414   CREATININE 0.92 07/22/2012 0851      Component Value Date/Time   CALCIUM 9.0 06/10/2013 1414   CALCIUM 9.1 07/22/2012 0851   ALKPHOS 72 06/10/2013 1414   ALKPHOS 66 07/22/2012 0851   AST 25 06/10/2013 1414   AST 28 07/22/2012 0851   ALT 29 06/10/2013 1414   ALT 36* 07/22/2012 0851   BILITOT 0.22 06/10/2013 1414   BILITOT 0.3 07/22/2012 0851       RADIOGRAPHIC STUDIES:  No results found.  ASSESSMENT: 42 year old female with:  #1  History of 2 primary breast cancers in the same breast. She has undergone a mastectomy. Patient now with recurrent breast cancer 2 possibly the right. Lymph nodes and chest wall. patient now  with recurrent breast cancer at that is ER positive PR positive likely her original disease back in 2001. Patient has had a right axillary lymph node dissection and removal of what looks like a Rotters node.  #2 Patient has had second opinion at [facility] by Dr. Tomi Likens one of the medical oncologists there. Her recommendation was CMF combination chemotherapy followed by an antiestrogen therapy. I do concur with this. However I do think that we should give the CMF concurrently with her radiation therapy. Patient and I discussed this today. I will plan on giving her 2 cycles of CMF up front. Then she could proceed with getting cyclophosphamide and 5-FU concurrently with radiation therapy. Once she completes her radiation then we will continue the CMF combination. She is now status post 6 cycles of CMF.  #3 New onset, rash-like area for the past two weeks to the right axilla after switching to Tom's deodorant one month ago.patient was seen about 10 days ago. She was begun on Keflex. Her rash has now improved.  #4 patient will continue tamoxifen 20 mg on a daily basis.  PLAN 1.continue tamoxifen daily.  #2 Bone density scan to evaluate for osteopenia  #3 Mammogram of the left breast  #4 I will see the patient back in 6 months time for followup or sooner if need arises.   All questions were answered. The patient knows to call the clinic with any problems, questions or concerns. We can certainly see the patient much sooner if necessary.  I spent 20 minutes counseling the patient face to face and Dr. Welton Flakes observed the right axilla and spoke with the patient for 10 minutes. The total time spent in the appointment was 30 minutes.   Drue Second, MD Medical/Oncology [facility] [phone] (beeper) [phone] (Office)  06/30/2013, 4:39 PM

## 2013-07-01 ENCOUNTER — Other Ambulatory Visit: Payer: Managed Care, Other (non HMO)

## 2013-07-01 ENCOUNTER — Ambulatory Visit: Payer: Managed Care, Other (non HMO)

## 2013-07-14 ENCOUNTER — Telehealth: Payer: Self-pay | Admitting: *Deleted

## 2013-07-14 DIAGNOSIS — B373 Candidiasis of vulva and vagina: Secondary | ICD-10-CM

## 2013-07-14 MED ORDER — FLUCONAZOLE 150 MG PO TABS: 150.0000 mg | ORAL_TABLET | Freq: Once | ORAL | Status: DC

## 2013-07-14 NOTE — Telephone Encounter (Signed)
Pt having vaginal itching and d/c.  She does have a hisory of vaginal yeast infections.  Per protocol Diflucan 150 mg sent to her pharmacy.

## 2013-07-15 ENCOUNTER — Ambulatory Visit
Admission: RE | Admit: 2013-07-15 | Discharge: 2013-07-15 | Disposition: A | Discharge status: Home / Self Care | Payer: Managed Care, Other (non HMO) | Source: Ambulatory Visit | Attending: Oncology | Admitting: Oncology

## 2013-07-15 DIAGNOSIS — C50911 Malignant neoplasm of unspecified site of right female breast: Secondary | ICD-10-CM

## 2013-07-18 ENCOUNTER — Telehealth: Payer: Self-pay | Admitting: *Deleted

## 2013-07-18 NOTE — Telephone Encounter (Signed)
Called pt to adv DEXA was normal - [facility]

## 2013-12-16 ENCOUNTER — Ambulatory Visit (HOSPITAL_BASED_OUTPATIENT_CLINIC_OR_DEPARTMENT_OTHER): Payer: Managed Care, Other (non HMO) | Admitting: Oncology

## 2013-12-16 ENCOUNTER — Encounter: Payer: Self-pay | Admitting: Oncology

## 2013-12-16 ENCOUNTER — Telehealth: Payer: Self-pay | Admitting: Oncology

## 2013-12-16 ENCOUNTER — Encounter (INDEPENDENT_AMBULATORY_CARE_PROVIDER_SITE_OTHER): Payer: Self-pay

## 2013-12-16 ENCOUNTER — Other Ambulatory Visit (HOSPITAL_BASED_OUTPATIENT_CLINIC_OR_DEPARTMENT_OTHER): Payer: Managed Care, Other (non HMO)

## 2013-12-16 VITALS — BP 126/80 | HR 82 | Temp 98.1°F | Resp 20 | Ht 67.0 in | Wt 182.9 lb

## 2013-12-16 DIAGNOSIS — C44509 Unspecified malignant neoplasm of skin of other part of trunk: Secondary | ICD-10-CM

## 2013-12-16 DIAGNOSIS — C50919 Malignant neoplasm of unspecified site of unspecified female breast: Secondary | ICD-10-CM

## 2013-12-16 DIAGNOSIS — Z9010 Acquired absence of unspecified breast and nipple: Secondary | ICD-10-CM

## 2013-12-16 DIAGNOSIS — C50911 Malignant neoplasm of unspecified site of right female breast: Secondary | ICD-10-CM

## 2013-12-16 DIAGNOSIS — C773 Secondary and unspecified malignant neoplasm of axilla and upper limb lymph nodes: Secondary | ICD-10-CM

## 2013-12-16 DIAGNOSIS — C44599 Other specified malignant neoplasm of skin of other part of trunk: Secondary | ICD-10-CM

## 2013-12-16 DIAGNOSIS — Z170 Estrogen receptor positive status [ER+]: Secondary | ICD-10-CM

## 2013-12-16 DIAGNOSIS — Z853 Personal history of malignant neoplasm of breast: Secondary | ICD-10-CM

## 2013-12-16 LAB — CBC WITH DIFFERENTIAL/PLATELET
BASO%: 0.8 % (ref 0.0–2.0)
BASOS ABS: 0.1 10*3/uL (ref 0.0–0.1)
EOS ABS: 0.3 10*3/uL (ref 0.0–0.5)
EOS%: 3.3 % (ref 0.0–7.0)
HEMATOCRIT: 35.3 % (ref 34.8–46.6)
HEMOGLOBIN: 11.7 g/dL (ref 11.6–15.9)
LYMPH%: 26 % (ref 14.0–49.7)
MCH: 28.8 pg (ref 25.1–34.0)
MCHC: 33 g/dL (ref 31.5–36.0)
MCV: 87.1 fL (ref 79.5–101.0)
MONO#: 0.4 10*3/uL (ref 0.1–0.9)
MONO%: 5.1 % (ref 0.0–14.0)
NEUT%: 64.8 % (ref 38.4–76.8)
NEUTROS ABS: 5.4 10*3/uL (ref 1.5–6.5)
PLATELETS: 260 10*3/uL (ref 145–400)
RBC: 4.06 10*6/uL (ref 3.70–5.45)
RDW: 13.2 % (ref 11.2–14.5)
WBC: 8.4 10*3/uL (ref 3.9–10.3)
lymph#: 2.2 10*3/uL (ref 0.9–3.3)

## 2013-12-16 LAB — COMPREHENSIVE METABOLIC PANEL (CC13)
ALT: 20 U/L (ref 0–55)
ANION GAP: 10 meq/L (ref 3–11)
AST: 22 U/L (ref 5–34)
Albumin: 3.7 g/dL (ref 3.5–5.0)
Alkaline Phosphatase: 76 U/L (ref 40–150)
BILIRUBIN TOTAL: 0.25 mg/dL (ref 0.20–1.20)
BUN: 10.8 mg/dL (ref 7.0–26.0)
CALCIUM: 9 mg/dL (ref 8.4–10.4)
CHLORIDE: 104 meq/L (ref 98–109)
CO2: 28 meq/L (ref 22–29)
CREATININE: 0.9 mg/dL (ref 0.6–1.1)
GLUCOSE: 114 mg/dL (ref 70–140)
Potassium: 3.9 mEq/L (ref 3.5–5.1)
Sodium: 142 mEq/L (ref 136–145)
Total Protein: 7.3 g/dL (ref 6.4–8.3)

## 2013-12-16 NOTE — Progress Notes (Signed)
OFFICE PROGRESS NOTE  PCP: The patient does not have a PCP at this time. GYN: Dr. Silas [place]  DIAGNOSIS: 43 year old female with history of invasive ductal carcinoma of the right breast. She was originally diagnosed in June 2001 with a stage II breast cancer. At that time she underwent a lumpectomy followed by chemotherapy given adjuvantly consisting of Adriamycin and Taxotere following CALGB 57846 study. She then went on to receive 5 years of tamoxifen.  She was then diagnosed with a second cancer in the ipsilateral breast measuring 1-0.1 and 1.6 cm with LVI. It was triple negative. She underwent a mastectomy on 06/18/2009 followed by adjuvant chemotherapy consisting of Taxol and carboplatinum which she completed in December of 2010.  PRIOR THERAPY: #1 Stage II right breast cancer diagnosed 2001 status post lumpectomy followed by adjuvant chemotherapy consisting of Adriamycin and Cytoxan following CALGB 96295 study. She then received 5 years of tamoxifen.  #2 Second cancer in the ipsilateral breast measuring 2.1 and 1.6 cm with LVI. ER negative PR negative HER-2/neu negative. Status post mastectomy 06/18/2009. She then received Taxol and carboplatinum from 08/16/2009 through 11/08/2009.  #3 The patient was recently seen at [facility] by her surgeon. She was noted to have some pain. She went on to have staging scans performed which revealed no evidence of disease. However on clinical exam patient apparently had a nodule in the right anterior chest wall. This has been biopsied and the pathology is consistent with a recurrent invasive ductal carcinoma that is ER positive PR positive.  #4 Patient is now status post right axillary lymph node dissection for level II and 3 lymph nodes there was no evidence of malignancy in 4 lymph nodes. There was soft tissue taken called Stann [place] node excision that showed 3.1 cm metastatic adenocarcinoma with perineural invasion apparently the tumor was ER positive  and PR positive. No lymph node tissue was identified possibly representing either an entirely replaced lymph node with extracapsular extension or a soft tissue metastasis.  #5 Patient began chemotherapy consisting of CMF on 06/10/2012.  #6 Patient received radiation therapy from 07/15/12 to 08/19/12. Concurrently with radiation she received Cytoxan and 5-FU (cycles 2-4).  #7 Radiation completed,   #8. She now has completed 6 cycles of CMF completed on 09/23/12  #9. Taking tamoxifen adjuvantly beginning 09/30/12.patient continues to be premenopausal and continues to menstruate.  #10 status post bilateral salpingo-oophorectomies  CURRENT THERAPY: Tamoxifen 20 mg daily since 09/30/12  INTERVAL HISTORY: Breanna Lara 43 y.o. female returns for followup visit today . Overall she is doing well and is without any complaints. She is fatigued. She has no fevers chills night sweats she does have hot flashes. She denies having any peripheral paresthesias no cough hemoptysis no shortness of breath or chest pains. She has not noticed any vaginal bleeding.  MEDICAL HISTORY: Past Medical History  Diagnosis Date  . Ectopic pregnancy, tubal 1998  . Irritable bowel syndrome   . Hemorrhoids   . History of toe surgery   . Anemia     H/O  . Blood transfusion, without reported diagnosis   . Genital HSV   . ASCUS (atypical squamous cells of undetermined significance) on Pap smear 2009    colpo  . Type o blood, rh negative   . Anxiety   . Hx of radiation therapy 01/04/01 - 02/19/01    right breast  . History of chemotherapy   . History of radiation therapy 07/15/12-08/19/12    right/axilla/clavicular region 4500 cGy/25 sessions  .  Breast cancer 2001, 2010, 2013     S/P Rt mastectomy  BRAC neg    ALLERGIES:  is allergic to percocet.  MEDICATIONS:  Current Outpatient Prescriptions  Medication Sig Dispense Refill  . calcium carbonate (OS-CAL) 600 MG TABS Take 1 tablet (600 mg total) by mouth 2 (two)  times daily with a meal.  60 tablet  12  . cholecalciferol (VITAMIN D) 1000 UNITS tablet Take 1,000 Units by mouth daily.        . Probiotic Product (PROBIOTIC DAILY PO) Take 1 tablet by mouth daily.      . tamoxifen (NOLVADEX) 20 MG tablet Take 1 tablet (20 mg total) by mouth daily.  90 tablet  6  . fexofenadine (ALLEGRA) 30 MG tablet Take 30 mg by mouth as needed.      . fluconazole (DIFLUCAN) 150 MG tablet Take 1 tablet (150 mg total) by mouth once.  1 tablet  0  . ibuprofen (ADVIL,MOTRIN) 600 MG tablet Take 1 tablet (600 mg total) by mouth every 6 (six) hours as needed for pain.  30 tablet  0   No current facility-administered medications for this visit.    SURGICAL HISTORY:  Past Surgical History  Procedure Laterality Date  . Mastectomy  06/18/09    RT  . Cholecystectomy    . Laparoscopy      salpingectomy  . Eye surgery    . Nasal septum surgery      deviated septum  . Breast lumpectomy  06/05/00    right, ER/PR +, HER 2-  . Axillary node dissection  04/29/12    right, 0/4 nodes pos, mass bx- met adenocarcinoma  . Chest wall biopsy  04/07/12    soft tissue, right -metastatic adenocarcinoma, ER/PR +, HER2 -  . Laparoscopic bilateral salpingo oopherectomy Bilateral 03/08/2013    Procedure: LAPAROSCOPIC BILATERAL SALPINGO OOPHORECTOMY;  Surgeon: Guss Bunde, MD;  Location: [facility];  Service: Gynecology;  Laterality: Bilateral;    REVIEW OF SYSTEMS:   Constitutional: Feels well.  Cardiovascular: No chest pain, shortness of breath, or edema.  Reporting mild lymphedema in the RUE.  Pulmonary: No cough or wheeze.  Gastrointestinal: No nausea, vomiting, or diarrhea. No bright red blood per rectum or change in bowel movement.  Genitourinary: No frequency, urgency, or dysuria. No vaginal bleeding or discharge.  Musculoskeletal: No myalgia or joint pain. Neurologic: No weakness, numbness, or change in gait.  Psychology: No depression, anxiety, or insomnia.  PHYSICAL EXAMINATION:   General: Well appearing, well developed female in no acute distress HEENT: PERRLA, sclerae anicteric no conjunctival pallor, MMM. Oropharynx clear. Neck: Supple. No palpable adenopathy Lungs: Clear to auscultation bilaterally with no wheezes, rhonchi, or rales Cardiovascular: Regular rate rhythm, S1 and S2 normal, No murmurs, rubs or gallops Abdomen: Soft, non-tender, non-distended, normoactive bowel sounds, no HSM Extremities: No clubbing, cyanosis, or edema.  Mild edema noted in the RUE Skin:  Well demarcated, mildly erythematous, rash-like area noted to the right axilla.  Area in the shape of a teardrop.  Increased warmth with the area with no drainage or nodularity noted.  No other lesions noted. Neuro: Non-focal Breast: Right chest wall looks well healed.  No evidence of local recurrence. No masses or nodularity noted.  ECOG PERFORMANCE STATUS: 1 - Symptomatic but completely ambulatory  LABORATORY DATA: Lab Results  Component Value Date   WBC 8.4 12/16/2013   HGB 11.7 12/16/2013   HCT 35.3 12/16/2013   MCV 87.1 12/16/2013   PLT 260 12/16/2013  Chemistry      Component Value Date/Time   NA 142 12/16/2013 1405   NA 138 07/22/2012 0851   K 3.9 12/16/2013 1405   K 4.0 07/22/2012 0851   CL 103 01/07/2013 1137   CL 104 07/22/2012 0851   CO2 28 12/16/2013 1405   CO2 27 07/22/2012 0851   BUN 10.8 12/16/2013 1405   BUN 7 07/22/2012 0851   CREATININE 0.9 12/16/2013 1405   CREATININE 0.92 07/22/2012 0851      Component Value Date/Time   CALCIUM 9.0 12/16/2013 1405   CALCIUM 9.1 07/22/2012 0851   ALKPHOS 76 12/16/2013 1405   ALKPHOS 66 07/22/2012 0851   AST 22 12/16/2013 1405   AST 28 07/22/2012 0851   ALT 20 12/16/2013 1405   ALT 36* 07/22/2012 0851   BILITOT 0.25 12/16/2013 1405   BILITOT 0.3 07/22/2012 0851       RADIOGRAPHIC STUDIES:  No results found.  ASSESSMENT: 43 year old female with:  #1  History of 2 primary breast cancers in the same breast. She has undergone a mastectomy.  Patient now with recurrent breast cancer 2 possibly the right. Lymph nodes and chest wall. patient now with recurrent breast cancer at that is ER positive PR positive likely her original disease back in 2001. Patient has had a right axillary lymph node dissection and removal of what looks like a Rotters node.  #2 Patient has had second opinion at [facility] by Dr. Janan Halter one of the medical oncologists there. Her recommendation was CMF combination chemotherapy followed by an antiestrogen therapy. I do concur with this. However I do think that we should give the CMF concurrently with her radiation therapy. Patient and I discussed this today. I will plan on giving her 2 cycles of CMF up front. Then she could proceed with getting cyclophosphamide and 5-FU concurrently with radiation therapy. Once she completes her radiation then we will continue the CMF combination. She is now status post 6 cycles of CMF.  #3 patient will continue tamoxifen 20 mg on a daily basis.  PLAN 1.continue tamoxifen daily.  #2 Bone density scan : normal  #3 Mammogram of the left breast  #4 I will see the patient back in 6 months time for followup or sooner if need arises.   All questions were answered. The patient knows to call the clinic with any problems, questions or concerns. We can certainly see the patient much sooner if necessary.  I spent 20 minutes counseling the patient face to face.The total time spent in the appointment was 30 minutes.   Marcy Panning, MD Medical/Oncology [facility] [phone] (beeper) [phone] (Office)  12/16/2013, 3:30 PM

## 2013-12-16 NOTE — Patient Instructions (Signed)
Breast Cancer Survivor Follow-Up Breast cancer begins when cells in the breast divide too rapidly. The extra cells form a lump (tumor). When the cancer is treated, the goal is to get rid of all cancer cells. However, sometimes a few cells survive. These cancer cells can then grow. They become recurrent cancer. This means the cancer comes back after treatment.  Most cases of recurrent breast cancer develop 3 to 5 years after treatment. However, sometimes it comes back just a few months after treatment. Other times, it does not come back until years later. If the cancer comes back in the same area as the first breast cancer, it is called a local recurrence. If the cancer comes back somewhere else in the body, it is called regional recurrence if the site is fairly near the breast or distant recurrence if it is far from the breast. Your caregiver may also use the term metastasize to indicate a cancer that has gone to another part of your body. Treatment is still possible after either kind of recurrence. The cancer can still be controlled.  CAUSES OF RECURRENT CANCER No one knows exactly why breast cancer starts in the first place. Why the cancer comes back after treatment is also not clear. It is known that certain conditions, called risk factors, can make this more likely. They include:  Developing breast cancer for the first time before age 54.  Having breast cancer that involves the lymph nodes. These are small, round pieces of tissue found all over the body. Their job is to help fight infections.  Having a large tumor. Cancer is more apt to come back if the first tumor was bigger than 2 inches (5 cm).  Having certain types of breast cancer, such as:  Inflammatory breast cancer. This rare type grows rapidly and causes the breast to become red and swollen.  A high-grade tumor. The grade of a tumor indicates how fast it will grow and spread. High-grade tumors grow more quickly than other types.  HER2  cancer. This refers to the tumor's genetic makeup. Tumors that have this type of gene are more likely to come back after treatment.  Having close tumor margins. This refers to the space between the tumor and normal, noncancerous cells. If the space is small, the tumor has a greater chance of coming back.  Having treatment involving a surgery to remove the tumor but not the entire breast (lumpectomy) and no radiation therapy. CARE AFTER BREAST CANCER Home Monitoring Women who have had breast cancer should continue to examine their breasts every month. The goal is to catch the cancer quickly if it comes back. Many women find it helpful to do so on the same day each month and to mark the calendar as a reminder. Let your caregiver know immediately if you have any signs of recurrent breast cancer. Symptoms will vary, depending on where the cancer recurs. The original type of treatment can also make a difference. Symptoms of local recurrence after a lumpectomy or a recurrence in the opposite breast may include:  A new lump or thickening in the breast.  A change in the way the skin looks on the breast (such as a rash, dimpling, or wrinkling).  Redness or swelling of the breast.  Changes in the nipple (such as being red, puckered, swollen, or leaking fluid). Symptoms of a recurrence after a breast removal surgery (mastectomy) may include:  A lump or thickening under the skin.  A thickening around the mastectomy scar. Symptoms  of regional recurrence in the lymph nodes near the breast may include:  A lump under the arm or above the collarbone.  Swelling of the arm.  Pain in the arm, shoulder, or chest.  Numbness in the hand or arm. Symptoms of distant recurrence may include:  A cough that does not go away.  Trouble breathing or shortness of breath.  Pain in the bones or the chest. This is pain that lasts or does not respond to rest and medicine.  Headaches.  Sudden vision  problems.  Dizziness.  Nausea or vomiting.  Losing weight without trying to.  Persistent abdominal pain.  Changes in bowel movements or blood in the stool.  Yellowing of the skin or eyes (jaundice).  Blood in the urine or bloody vaginal discharge. Clinical Monitoring  It is helpful to keep a schedule of appointments for needed tests and exams. This includes physical exams, breast exams, exams of the lymph nodes, and general exams.  For the first 3 years after being treated for breast cancer, see your caregiver every 3 to 6 months.  For years 4 and 5 after breast cancer, see your caregiver every 6 to 12 months.  After 5 years, see your caregiver at least once a year.  Regular breast X-rays (mammograms) should continue even if you had a mastectomy.  A mammogram should be done 1 year after the mammogram that first detected breast cancer.  A mammogram should be done every 6 to 12 months after that. Follow your caregiver's advice.  A pelvic exam done by your caregiver checks whether female organs are the normal size and shape. The exam is usually done every year. Ask your caregiver if that schedule is right for you.  Women taking tamoxifen should report any vaginal bleeding immediately to their caregiver. Tamoxifen is often given to women with a certain type of breast cancer. It has been shown to help prevent recurrence.  You will need to decide who your primary caregiver will be.  Most people continue to see their cancer specialist (oncologist) every 3 to 6 months for the first year after cancer treatment.  At some point, you may want to go back to seeing your family caregiver. You would no longer see your oncologist for regular checkups. Many women do this about 1 year after their first diagnosis of breast cancer.  You will still need to be seen every so often by your oncologist. Ask how often that should be. Coordinate this with your family or primary caregiver.  Think about  having genetic counseling. This would provide information on traits that can be passed or inherited from one generation to the next. In some cases, breast cancer runs in families. Tell your caregiver if you:  Are of Ashkenazi Jewish heritage.  Have any family member who has had ovarian cancer.  Have a mother, sister, or daughter who had breast cancer before age 7.  Have 2 or more close relatives who have had breast cancer. This means a mother, sister, daughter, aunt, or grandmother.  Had breast cancer in both breasts.  Have a female relative who has had breast cancer.  Some tests are not recommended for routine screening. Someone recovering from breast cancer does not need to have these tests if there are no problems. The tests have risks, such as radiation exposure, and can be costly. The risks of these tests are thought to be greater than the benefits:  Blood tests.  Chest X-rays.  Bone scans.  Liver ultrasound.  Computed tomography (CT scan).  Positron emission tomography (PET scan).  Magnetic resonance imaging (MRI scan). DIAGNOSIS OF RECURRENT CANCER Recurrent breast cancer may be suspected for various reasons. A mammogram may not look normal. You might feel a lump or have other symptoms. Your caregiver may find something unusual during an exam. To be sure, your caregiver will probably order some tests. The tests are needed because there are symptoms or hints of a problem. They could include:  Blood tests, including a test to check how well the liver is working. The liver is a common site for a distant cancer recurrence.  Imaging tests that create pictures of the inside of the body. These tests include:  Chest X-rays to show if the cancer has come back in the lungs.  CT scans to create detailed pictures of various areas of the body and help find a distant recurrence.  MRI scans to find anything unusual in the breast, chest, or lymph nodes.  Breast ultrasound tests to  examine the breasts.  Bone scans to create a picture of your whole skeleton and find cancer in bony areas.  PET scans to create an image of the whole body. PET scans can be used together with CT scans to show more detail.  Biopsy. A small sample of tissue is taken and checked under a microscope. If cancer cells are found, they may be tested to see if they contain the HER2 gene or the hormones estrogen and progesterone. This will help your caregiver decide how to treat the recurrent cancer. TREATMENT  How recurrent breast cancer is treated depends on where the new cancer is found. The type of treatment that was used for the first breast cancer makes a difference, too. A combination of treatments may be used. Options include:  Surgery.  If the cancer comes back in the breast that was not treated before, you may need a lumpectomy or mastectomy.  If the cancer comes back in the breast that was treated before, you may need a mastectomy.  The lymph nodes under the arm may need to be removed.  Radiation therapy.  For a local recurrence, radiation may be used if it was not used during the first treatment.  For a distance recurrence, radiation is sometimes used.  Chemotherapy.  This may be used before surgery to treat recurrent breast cancer.  This may be used to treat recurrent cancer that cannot be treated with surgery.  This may be used to treat a distant recurrence.  Hormone therapy.  Women with the HER2 gene may be given hormone therapy to attack this gene. Document Released: 07/16/2011 Document Revised: 02/09/2012 Document Reviewed: 07/16/2011 [facility] Patient Information 2014 [place], [place].

## 2013-12-17 LAB — VITAMIN D 25 HYDROXY (VIT D DEFICIENCY, FRACTURES): VIT D 25 HYDROXY: 38 ng/mL (ref 30–89)

## 2013-12-30 ENCOUNTER — Other Ambulatory Visit: Payer: Self-pay | Admitting: Obstetrics & Gynecology

## 2013-12-30 ENCOUNTER — Other Ambulatory Visit: Payer: Self-pay | Admitting: Oncology

## 2014-06-07 ENCOUNTER — Telehealth: Payer: Self-pay | Admitting: Hematology and Oncology

## 2014-06-07 NOTE — Telephone Encounter (Signed)
, °

## 2014-06-16 ENCOUNTER — Other Ambulatory Visit: Payer: Managed Care, Other (non HMO)

## 2014-06-16 ENCOUNTER — Ambulatory Visit: Payer: Managed Care, Other (non HMO) | Admitting: Oncology

## 2014-09-18 ENCOUNTER — Other Ambulatory Visit: Payer: Self-pay

## 2014-09-18 DIAGNOSIS — Z1239 Encounter for other screening for malignant neoplasm of breast: Secondary | ICD-10-CM

## 2014-09-19 ENCOUNTER — Other Ambulatory Visit: Payer: Self-pay

## 2014-09-19 DIAGNOSIS — C50919 Malignant neoplasm of unspecified site of unspecified female breast: Secondary | ICD-10-CM

## 2014-09-20 ENCOUNTER — Other Ambulatory Visit: Payer: Managed Care, Other (non HMO)

## 2014-09-20 ENCOUNTER — Encounter: Payer: Managed Care, Other (non HMO) | Admitting: Hematology and Oncology

## 2014-09-20 NOTE — Progress Notes (Deleted)
Patient Care Team: No Pcp Per Patient as PCP - General (General Practice)  DIAGNOSIS: No matching staging information was found for the patient.  SUMMARY OF ONCOLOGIC HISTORY:  No history exists.    CHIEF COMPLIANT:   INTERVAL HISTORY: Breanna Lara is a     REVIEW OF SYSTEMS:   Constitutional: Denies fevers, chills or abnormal weight loss Eyes: Denies blurriness of vision Ears, nose, mouth, throat, and face: Denies mucositis or sore throat Respiratory: Denies cough, dyspnea or wheezes Cardiovascular: Denies palpitation, chest discomfort or lower extremity swelling Gastrointestinal:  Denies nausea, heartburn or change in bowel habits Skin: Denies abnormal skin rashes Lymphatics: Denies new lymphadenopathy or easy bruising Neurological:Denies numbness, tingling or new weaknesses Behavioral/Psych: Mood is stable, no new changes  Breast: *** denies any pain or lumps or nodules in either breasts All other systems were reviewed with the patient and are negative.  I have reviewed the past medical history, past surgical history, social history and family history with the patient and they are unchanged from previous note.  ALLERGIES:  is allergic to percocet.  MEDICATIONS:  Current Outpatient Prescriptions  Medication Sig Dispense Refill  . calcium carbonate (OS-CAL) 600 MG TABS Take 1 tablet (600 mg total) by mouth 2 (two) times daily with a meal.  60 tablet  12  . cholecalciferol (VITAMIN D) 1000 UNITS tablet Take 1,000 Units by mouth daily.        . fexofenadine (ALLEGRA) 30 MG tablet Take 30 mg by mouth as needed.      . fluconazole (DIFLUCAN) 150 MG tablet TAKE ONE TABLET BY MOUTH NOW AND REPEAT IN 3 DAYS AS NEEDED  2 tablet  1  . ibuprofen (ADVIL,MOTRIN) 600 MG tablet Take 1 tablet (600 mg total) by mouth every 6 (six) hours as needed for pain.  30 tablet  0  . pramoxine-hydrocortisone (PROCTOCREAM-HC) 1-1 % rectal cream Place 1 applicator rectally 3 (three) times daily as  needed.  30 g  1  . Probiotic Product (PROBIOTIC DAILY PO) Take 1 tablet by mouth daily.      . tamoxifen (NOLVADEX) 20 MG tablet TAKE ONE TABLET BY MOUTH EVERY DAY  480 tablet  6   No current facility-administered medications for this visit.    PHYSICAL EXAMINATION: ECOG PERFORMANCE STATUS: {CHL ONC ECOG PS:[phone]}  There were no vitals filed for this visit. There were no vitals filed for this visit.  GENERAL:alert, no distress and comfortable SKIN: skin color, texture, turgor are normal, no rashes or significant lesions EYES: normal, Conjunctiva are pink and non-injected, sclera clear OROPHARYNX:no exudate, no erythema and lips, buccal mucosa, and tongue normal  NECK: supple, thyroid normal size, non-tender, without nodularity LYMPH:  no palpable lymphadenopathy in the cervical, axillary or inguinal LUNGS: clear to auscultation and percussion with normal breathing effort HEART: regular rate & rhythm and no murmurs and no lower extremity edema ABDOMEN:abdomen soft, non-tender and normal bowel sounds Musculoskeletal:no cyanosis of digits and no clubbing  NEURO: alert & oriented x 3 with fluent speech, no focal motor/sensory deficits BREAST:*** No palpable masses or nodules in either right or left breasts. No palpable axillary supraclavicular or infraclavicular adenopathy no breast tenderness or nipple discharge.   LABORATORY DATA:  I have reviewed the data as listed   Chemistry      Component Value Date/Time   NA 142 12/16/2013 1405   NA 138 07/22/2012 0851   K 3.9 12/16/2013 1405   K 4.0 07/22/2012 0851   CL 103  01/07/2013 1137   CL 104 07/22/2012 0851   CO2 28 12/16/2013 1405   CO2 27 07/22/2012 0851   BUN 10.8 12/16/2013 1405   BUN 7 07/22/2012 0851   CREATININE 0.9 12/16/2013 1405   CREATININE 0.92 07/22/2012 0851      Component Value Date/Time   CALCIUM 9.0 12/16/2013 1405   CALCIUM 9.1 07/22/2012 0851   ALKPHOS 76 12/16/2013 1405   ALKPHOS 66 07/22/2012 0851   AST 22  12/16/2013 1405   AST 28 07/22/2012 0851   ALT 20 12/16/2013 1405   ALT 36* 07/22/2012 0851   BILITOT 0.25 12/16/2013 1405   BILITOT 0.3 07/22/2012 0851       Lab Results  Component Value Date   WBC 8.4 12/16/2013   HGB 11.7 12/16/2013   HCT 35.3 12/16/2013   MCV 87.1 12/16/2013   PLT 260 12/16/2013   NEUTROABS 5.4 12/16/2013     RADIOGRAPHIC STUDIES: I have personally reviewed the radiology reports and agreed with their findings. No results found.   ASSESSMENT & PLAN:  No problem-specific assessment & plan notes found for this encounter.   No orders of the defined types were placed in this encounter.   The patient has a good understanding of the overall plan. she agrees with it. She will call with any problems that may develop before her next visit here.  I spent {CHL ONC TIME VISIT - TMHDQ:[id number]} counseling the patient face to face. The total time spent in the appointment was {CHL ONC TIME VISIT - JHERD:[id number]} and more than 50% was on counseling and review of test results    Rulon Eisenmenger, MD 09/20/2014 2:47 PM

## 2014-09-21 ENCOUNTER — Other Ambulatory Visit: Payer: Self-pay

## 2014-09-21 DIAGNOSIS — Z1231 Encounter for screening mammogram for malignant neoplasm of breast: Secondary | ICD-10-CM

## 2014-09-27 ENCOUNTER — Ambulatory Visit: Payer: Managed Care, Other (non HMO)

## 2014-10-02 ENCOUNTER — Encounter: Payer: Self-pay | Admitting: Oncology

## 2014-10-03 ENCOUNTER — Ambulatory Visit: Payer: Self-pay

## 2014-10-04 ENCOUNTER — Telehealth: Payer: Self-pay | Admitting: Hematology and Oncology

## 2014-10-04 NOTE — Telephone Encounter (Signed)
, °

## 2014-10-16 ENCOUNTER — Ambulatory Visit
Admission: RE | Admit: 2014-10-16 | Discharge: 2014-10-16 | Disposition: A | Discharge status: Home / Self Care | Payer: BC Managed Care – PPO | Source: Ambulatory Visit

## 2014-10-16 DIAGNOSIS — Z1231 Encounter for screening mammogram for malignant neoplasm of breast: Secondary | ICD-10-CM

## 2014-10-19 ENCOUNTER — Ambulatory Visit: Payer: Self-pay

## 2014-10-31 ENCOUNTER — Ambulatory Visit (HOSPITAL_BASED_OUTPATIENT_CLINIC_OR_DEPARTMENT_OTHER): Payer: BC Managed Care – PPO | Admitting: Hematology and Oncology

## 2014-10-31 ENCOUNTER — Telehealth: Payer: Self-pay | Admitting: Hematology and Oncology

## 2014-10-31 ENCOUNTER — Other Ambulatory Visit (HOSPITAL_BASED_OUTPATIENT_CLINIC_OR_DEPARTMENT_OTHER): Payer: BC Managed Care – PPO

## 2014-10-31 VITALS — BP 122/86 | HR 86 | Temp 98.8°F | Resp 18 | Ht 67.0 in | Wt 183.8 lb

## 2014-10-31 DIAGNOSIS — Z79810 Long term (current) use of selective estrogen receptor modulators (SERMs): Secondary | ICD-10-CM

## 2014-10-31 DIAGNOSIS — C50411 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 female breast: Secondary | ICD-10-CM

## 2014-10-31 DIAGNOSIS — C50919 Malignant neoplasm of unspecified site of unspecified female breast: Secondary | ICD-10-CM

## 2014-10-31 LAB — COMPREHENSIVE METABOLIC PANEL (CC13)
ALBUMIN: 3.7 g/dL (ref 3.5–5.0)
ALK PHOS: 67 U/L (ref 40–150)
ALT: 26 U/L (ref 0–55)
AST: 26 U/L (ref 5–34)
Anion Gap: 9 mEq/L (ref 3–11)
BUN: 11.4 mg/dL (ref 7.0–26.0)
CALCIUM: 9.3 mg/dL (ref 8.4–10.4)
CHLORIDE: 104 meq/L (ref 98–109)
CO2: 29 mEq/L (ref 22–29)
Creatinine: 0.9 mg/dL (ref 0.6–1.1)
Glucose: 96 mg/dl (ref 70–140)
POTASSIUM: 4 meq/L (ref 3.5–5.1)
SODIUM: 142 meq/L (ref 136–145)
TOTAL PROTEIN: 7.4 g/dL (ref 6.4–8.3)
Total Bilirubin: 0.3 mg/dL (ref 0.20–1.20)

## 2014-10-31 LAB — CBC WITH DIFFERENTIAL/PLATELET
BASO%: 0.5 % (ref 0.0–2.0)
BASOS ABS: 0 10*3/uL (ref 0.0–0.1)
EOS ABS: 0.3 10*3/uL (ref 0.0–0.5)
EOS%: 3.5 % (ref 0.0–7.0)
HCT: 37.9 % (ref 34.8–46.6)
HEMOGLOBIN: 12.4 g/dL (ref 11.6–15.9)
LYMPH#: 2.3 10*3/uL (ref 0.9–3.3)
LYMPH%: 27.7 % (ref 14.0–49.7)
MCH: 28.6 pg (ref 25.1–34.0)
MCHC: 32.7 g/dL (ref 31.5–36.0)
MCV: 87.5 fL (ref 79.5–101.0)
MONO#: 0.6 10*3/uL (ref 0.1–0.9)
MONO%: 7 % (ref 0.0–14.0)
NEUT%: 61.3 % (ref 38.4–76.8)
NEUTROS ABS: 5.1 10*3/uL (ref 1.5–6.5)
Platelets: 251 10*3/uL (ref 145–400)
RBC: 4.33 10*6/uL (ref 3.70–5.45)
RDW: 13.4 % (ref 11.2–14.5)
WBC: 8.2 10*3/uL (ref 3.9–10.3)

## 2014-10-31 MED ORDER — LETROZOLE 2.5 MG PO TABS: 2.5000 mg | ORAL_TABLET | Freq: Every day | ORAL | Status: DC

## 2014-10-31 NOTE — Progress Notes (Signed)
Patient Care Team: No Pcp Per Patient as PCP - General (General Practice)  DIAGNOSIS: No matching staging information was found for the patient.  SUMMARY OF ONCOLOGIC HISTORY:   Breast cancer of upper-outer quadrant of right female breast   05/01/2000 Initial Biopsy Right breast invasive ductal carcinoma stage II treated with lumpectomy followed by chemotherapy Adriamycin and Taxotere on CALGB 48250 study followed by 5 years of tamoxifen   05/31/2009 Relapse/Recurrence Right breast recurrent cancer: 1.6 cm with LV I will negative status post mastectomy followed by adjuvant chemotherapy Taxol and carboplatin completed December 2010   03/31/2012 Relapse/Recurrence Chest wall recurrence diagnosed at [facility] ER/PR positive status post right axillary lymph node dissection for lymph nodes negative: 3.1 cm adenocarcinoma with perineural invasion, could be soft tissue met versus lymph node   06/10/2012 - 09/23/2012 Chemotherapy CMF chemotherapy x6 cycles   07/15/2012 - 08/19/2012 Radiation Therapy Concurrent with radiation patient received Cytoxan and 5-FU cycles 2 to 4   09/30/2012 -  Anti-estrogen oral therapy Tamoxifen 20 mg daily    Surgery Bilateral salpingo-oophorectomy    CHIEF COMPLIANT: followup of breast cancer  INTERVAL HISTORY: Breanna Lara is a 43 year old Caucasian with above-mentioned history of multiple recurrences of breast cancer who is here on tamoxifen therapy but started October 2013. She has occasional hot flashes but are manageable on tamoxifen. She underwent bilateral something oophorectomy on a year ago. She is here today to go over the results of the mammograms and to discuss further treatment plan. She denies any lumps or nodules. Her daughter is now 62 years old. She was pregnant when she got the diagnosis of recurrence.  REVIEW OF SYSTEMS:   Constitutional: Denies fevers, chills or abnormal weight loss Eyes: Denies blurriness of vision Ears, nose, mouth, throat, and  face: Denies mucositis or sore throat Respiratory: Denies cough, dyspnea or wheezes Cardiovascular: Denies palpitation, chest discomfort or lower extremity swelling Gastrointestinal:  Denies nausea, heartburn or change in bowel habits Skin: Denies abnormal skin rashes Lymphatics: Denies new lymphadenopathy or easy bruising Neurological:Denies numbness, tingling or new weaknesses Behavioral/Psych: Mood is stable, no new changes  Breast:  denies any pain or lumps or nodules in either breasts All other systems were reviewed with the patient and are negative.  I have reviewed the past medical history, past surgical history, social history and family history with the patient and they are unchanged from previous note.  ALLERGIES:  is allergic to percocet.  MEDICATIONS:  Current Outpatient Prescriptions  Medication Sig Dispense Refill  . calcium carbonate (OS-CAL) 600 MG TABS Take 1 tablet (600 mg total) by mouth 2 (two) times daily with a meal. 60 tablet 12  . cholecalciferol (VITAMIN D) 1000 UNITS tablet Take 1,000 Units by mouth daily.      . fexofenadine (ALLEGRA) 30 MG tablet Take 30 mg by mouth as needed.    . fluconazole (DIFLUCAN) 150 MG tablet TAKE ONE TABLET BY MOUTH NOW AND REPEAT IN 3 DAYS AS NEEDED 2 tablet 1  . ibuprofen (ADVIL,MOTRIN) 600 MG tablet Take 1 tablet (600 mg total) by mouth every 6 (six) hours as needed for pain. 30 tablet 0  . pramoxine-hydrocortisone (PROCTOCREAM-HC) 1-1 % rectal cream Place 1 applicator rectally 3 (three) times daily as needed. 30 g 1  . Probiotic Product (PROBIOTIC DAILY PO) Take 1 tablet by mouth daily.    Marland Kitchen letrozole (FEMARA) 2.5 MG tablet Take 1 tablet (2.5 mg total) by mouth daily. 90 tablet 3   No current  facility-administered medications for this visit.    PHYSICAL EXAMINATION: ECOG PERFORMANCE STATUS: 0 - Asymptomatic  Filed Vitals:   10/31/14 0936  BP: 122/86  Pulse: 86  Temp: 98.8 F (37.1 C)  Resp: 18   Filed Weights    10/31/14 0936  Weight: 183 lb 12.8 oz (83.371 kg)    GENERAL:alert, no distress and comfortable SKIN: skin color, texture, turgor are normal, no rashes or significant lesions EYES: normal, Conjunctiva are pink and non-injected, sclera clear OROPHARYNX:no exudate, no erythema and lips, buccal mucosa, and tongue normal  NECK: supple, thyroid normal size, non-tender, without nodularity LYMPH:  no palpable lymphadenopathy in the cervical, axillary or inguinal LUNGS: clear to auscultation and percussion with normal breathing effort HEART: regular rate & rhythm and no murmurs and no lower extremity edema ABDOMEN:abdomen soft, non-tender and normal bowel sounds Musculoskeletal:no cyanosis of digits and no clubbing  NEURO: alert & oriented x 3 with fluent speech, no focal motor/sensory deficits  LABORATORY DATA:  I have reviewed the data as listed   Chemistry      Component Value Date/Time   NA 142 10/31/2014 0921   NA 138 07/22/2012 0851   K 4.0 10/31/2014 0921   K 4.0 07/22/2012 0851   CL 103 01/07/2013 1137   CL 104 07/22/2012 0851   CO2 29 10/31/2014 0921   CO2 27 07/22/2012 0851   BUN 11.4 10/31/2014 0921   BUN 7 07/22/2012 0851   CREATININE 0.9 10/31/2014 0921   CREATININE 0.92 07/22/2012 0851      Component Value Date/Time   CALCIUM 9.3 10/31/2014 0921   CALCIUM 9.1 07/22/2012 0851   ALKPHOS 67 10/31/2014 0921   ALKPHOS 66 07/22/2012 0851   AST 26 10/31/2014 0921   AST 28 07/22/2012 0851   ALT 26 10/31/2014 0921   ALT 36* 07/22/2012 0851   BILITOT 0.30 10/31/2014 0921   BILITOT 0.3 07/22/2012 0851       Lab Results  Component Value Date   WBC 8.2 10/31/2014   HGB 12.4 10/31/2014   HCT 37.9 10/31/2014   MCV 87.5 10/31/2014   PLT 251 10/31/2014   NEUTROABS 5.1 10/31/2014     RADIOGRAPHIC STUDIES: I have personally reviewed the radiology reports and agreed with their findings. Mammograms done November 2015 normal  ASSESSMENT & PLAN:  Breast cancer of  upper-outer quadrant of right female breast 2 primary breast cancer--one is ER/PR positive.  The other is triple neg.  Pt is BRCA neg. I discussed the patient's treatment course in great detail. She is currently on antiestrogen therapy with tamoxifen. He has been tolerating it fairly well without any major side effects. I recommended Switching her from tamoxifen to Femara. I discussed this and benefits of Femara including the risk of osteoporosis. Patient understands this and is willing to change. Duration of therapy is 5 years  Surveillance: Annual mammograms and left breast. Physical exam today on the left breast was normal. Survivorship: Encouraged patient to stay on top of her preventive surveillance as recommended by her primary care physician. Also encouraged her to stay active exercising daily. Also encouraged her to eat more fruits and vegetables and less red meat.   No orders of the defined types were placed in this encounter.   The patient has a good understanding of the overall plan. she agrees with it. She will call with any problems that may develop before her next visit here.   Rulon Eisenmenger, MD 10/31/2014 10:45 AM

## 2014-10-31 NOTE — Telephone Encounter (Signed)
, °

## 2014-10-31 NOTE — Assessment & Plan Note (Addendum)
2 primary breast cancer--one is ER/PR positive.  The other is triple neg.  Pt is BRCA neg. I discussed the patient's treatment course in great detail. She is currently on antiestrogen therapy with tamoxifen. He has been tolerating it fairly well without any major side effects. I recommended Switching her from tamoxifen to Femara. I discussed this and benefits of Femara including the risk of osteoporosis. Patient understands this and is willing to change. Duration of therapy is 5 years  Surveillance: Annual mammograms and left breast. Physical exam today on the left breast was normal. Survivorship: Encouraged patient to stay on top of her preventive surveillance as recommended by her primary care physician. Also encouraged her to stay active exercising daily. Also encouraged her to eat more fruits and vegetables and less red meat.

## 2015-01-18 ENCOUNTER — Telehealth: Payer: Self-pay

## 2015-01-18 NOTE — Telephone Encounter (Signed)
Pt called in about side effects of letrozole.  Hot flashes, fatigue, pain.  Pt reports pain is the worst side effect bothering her.  Pt reports she stopped taking letrozole last week and does not want to take it anymore.  Advised patient that she should come in to her appt on 3/3 and discuss further with Dr. Lindi Adie.  Pt voiced understanding.    Routed to Dr. Lindi Adie

## 2015-01-30 ENCOUNTER — Other Ambulatory Visit: Payer: BC Managed Care – PPO

## 2015-01-30 ENCOUNTER — Ambulatory Visit: Payer: BC Managed Care – PPO | Admitting: Hematology and Oncology

## 2015-01-31 ENCOUNTER — Other Ambulatory Visit: Payer: Self-pay

## 2015-01-31 DIAGNOSIS — C50411 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 female breast: Secondary | ICD-10-CM

## 2015-02-01 ENCOUNTER — Telehealth: Payer: Self-pay | Admitting: Hematology and Oncology

## 2015-02-01 ENCOUNTER — Other Ambulatory Visit (HOSPITAL_BASED_OUTPATIENT_CLINIC_OR_DEPARTMENT_OTHER): Payer: 59

## 2015-02-01 ENCOUNTER — Ambulatory Visit (HOSPITAL_BASED_OUTPATIENT_CLINIC_OR_DEPARTMENT_OTHER): Payer: 59 | Admitting: Hematology and Oncology

## 2015-02-01 DIAGNOSIS — Z170 Estrogen receptor positive status [ER+]: Secondary | ICD-10-CM

## 2015-02-01 DIAGNOSIS — C7989 Secondary malignant neoplasm of other specified sites: Secondary | ICD-10-CM

## 2015-02-01 DIAGNOSIS — C50411 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 female breast: Secondary | ICD-10-CM

## 2015-02-01 LAB — CBC WITH DIFFERENTIAL/PLATELET
BASO%: 0.9 % (ref 0.0–2.0)
BASOS ABS: 0.1 10*3/uL (ref 0.0–0.1)
EOS%: 2.5 % (ref 0.0–7.0)
Eosinophils Absolute: 0.2 10*3/uL (ref 0.0–0.5)
HEMATOCRIT: 38.4 % (ref 34.8–46.6)
HEMOGLOBIN: 12.6 g/dL (ref 11.6–15.9)
LYMPH%: 34.8 % (ref 14.0–49.7)
MCH: 28.3 pg (ref 25.1–34.0)
MCHC: 32.8 g/dL (ref 31.5–36.0)
MCV: 86.3 fL (ref 79.5–101.0)
MONO#: 0.4 10*3/uL (ref 0.1–0.9)
MONO%: 6.1 % (ref 0.0–14.0)
NEUT#: 3.6 10*3/uL (ref 1.5–6.5)
NEUT%: 55.7 % (ref 38.4–76.8)
Platelets: 261 10*3/uL (ref 145–400)
RBC: 4.45 10*6/uL (ref 3.70–5.45)
RDW: 13.4 % (ref 11.2–14.5)
WBC: 6.4 10*3/uL (ref 3.9–10.3)
lymph#: 2.2 10*3/uL (ref 0.9–3.3)

## 2015-02-01 LAB — COMPREHENSIVE METABOLIC PANEL (CC13)
ALBUMIN: 3.7 g/dL (ref 3.5–5.0)
ALT: 25 U/L (ref 0–55)
ANION GAP: 10 meq/L (ref 3–11)
AST: 27 U/L (ref 5–34)
Alkaline Phosphatase: 69 U/L (ref 40–150)
BUN: 11.2 mg/dL (ref 7.0–26.0)
CALCIUM: 9.1 mg/dL (ref 8.4–10.4)
CHLORIDE: 103 meq/L (ref 98–109)
CO2: 29 meq/L (ref 22–29)
Creatinine: 1 mg/dL (ref 0.6–1.1)
EGFR: 69 mL/min/{1.73_m2} — ABNORMAL LOW (ref >90)
GLUCOSE: 107 mg/dL (ref 70–140)
POTASSIUM: 3.8 meq/L (ref 3.5–5.1)
SODIUM: 142 meq/L (ref 136–145)
TOTAL PROTEIN: 7.2 g/dL (ref 6.4–8.3)
Total Bilirubin: 0.39 mg/dL (ref 0.20–1.20)

## 2015-02-01 MED ORDER — EXEMESTANE 25 MG PO TABS: 25.0000 mg | ORAL_TABLET | Freq: Every day | ORAL | Status: DC

## 2015-02-01 NOTE — Assessment & Plan Note (Signed)
Right breast invasive ductal carcinoma stage II diagnosed 05/01/2000 treated with lumpectomy and adjuvant chemotherapy CALGB 83419 study followed by 5 years of tamoxifen Recurrent right breast cancer 05/31/2009 status post mastectomy followed by adjuvant chemotherapy with Taxol and carboplatin Recurrent breast cancer with chest wall recurrence diagnosed at [facility] status post resection and axillary lymph node dissection followed by CMF chemotherapy followed by radiation  Current treatment: Antiestrogen therapy with tamoxifen started 09/30/2012 status post bilateral salpingo-oophorectomy started Femara in January 2016 could not tolerate it switching to Aromasin 02/01/2015  Breast cancer surveillance 1. Breast exams to be done every 6 months 2. Mammogram on the left breast annually  If the patient can tolerate Aromasin, will then give her a 3 month supply. If she cannot tolerate Aromasin, we will then switch her back to tamoxifen.  Return to clinic in 6 months for follow-up.

## 2015-02-01 NOTE — Telephone Encounter (Signed)
per pof to sch pt appt-gave pt copy of sch °

## 2015-02-01 NOTE — Telephone Encounter (Signed)
per pof ot sch pt appt-gave pt copy of sch °

## 2015-02-01 NOTE — Progress Notes (Signed)
Patient Care Team: No Pcp Per Patient as PCP - General (General Practice)  DIAGNOSIS: No matching staging information was found for the patient.  SUMMARY OF ONCOLOGIC HISTORY:   Breast cancer of upper-outer quadrant of right female breast   05/01/2000 Initial Biopsy Right breast invasive ductal carcinoma stage II treated with lumpectomy followed by chemotherapy Adriamycin and Taxotere on CALGB 98921 study followed by 5 years of tamoxifen   05/31/2009 Relapse/Recurrence Right breast recurrent cancer: 1.6 cm with LV I will negative status post mastectomy followed by adjuvant chemotherapy Taxol and carboplatin completed December 2010   03/31/2012 Relapse/Recurrence Chest wall recurrence diagnosed at [facility] ER/PR positive status post right axillary lymph node dissection for lymph nodes negative: 3.1 cm adenocarcinoma with perineural invasion, could be soft tissue met versus lymph node   06/10/2012 - 09/23/2012 Chemotherapy CMF chemotherapy x6 cycles   07/15/2012 - 08/19/2012 Radiation Therapy Concurrent with radiation patient received Cytoxan and 5-FU cycles 2 to 4   09/30/2012 -  Anti-estrogen oral therapy Tamoxifen 20 mg daily    Surgery Bilateral salpingo-oophorectomy    CHIEF COMPLIANT: Unable to tolerate letrozole because of flulike symptoms  INTERVAL HISTORY: Breanna Lara is a 44 year old lady with above-mentioned history of relapsed recurrent breast cancer who is currently on letrozole started in January 2016. She felt severely fatigued and weak and musculoskeletal symptoms and she stopped taking it and she is feeling better today. She did not have any of these symptoms of tamoxifen therapy.  REVIEW OF SYSTEMS:   Constitutional: Denies fevers, chills or abnormal weight loss Eyes: Denies blurriness of vision Ears, nose, mouth, throat, and face: Denies mucositis or sore throat Respiratory: Denies cough, dyspnea or wheezes Cardiovascular: Denies palpitation, chest discomfort or lower  extremity swelling Gastrointestinal:  Denies nausea, heartburn or change in bowel habits Skin: Denies abnormal skin rashes Lymphatics: Denies new lymphadenopathy or easy bruising Neurological:Denies numbness, tingling or new weaknesses Behavioral/Psych: Mood is stable, no new changes  Breast:  denies any pain or lumps or nodules in either breasts All other systems were reviewed with the patient and are negative.  I have reviewed the past medical history, past surgical history, social history and family history with the patient and they are unchanged from previous note.  ALLERGIES:  is allergic to percocet.  MEDICATIONS:  Current Outpatient Prescriptions  Medication Sig Dispense Refill  . calcium carbonate (OS-CAL) 600 MG TABS Take 1 tablet (600 mg total) by mouth 2 (two) times daily with a meal. 60 tablet 12  . cholecalciferol (VITAMIN D) 1000 UNITS tablet Take 1,000 Units by mouth daily.      . fexofenadine (ALLEGRA) 30 MG tablet Take 30 mg by mouth as needed.    . fluconazole (DIFLUCAN) 150 MG tablet TAKE ONE TABLET BY MOUTH NOW AND REPEAT IN 3 DAYS AS NEEDED 2 tablet 1  . ibuprofen (ADVIL,MOTRIN) 600 MG tablet Take 1 tablet (600 mg total) by mouth every 6 (six) hours as needed for pain. 30 tablet 0  . pramoxine-hydrocortisone (PROCTOCREAM-HC) 1-1 % rectal cream Place 1 applicator rectally 3 (three) times daily as needed. 30 g 1  . Probiotic Product (PROBIOTIC DAILY PO) Take 1 tablet by mouth daily.    Marland Kitchen exemestane (AROMASIN) 25 MG tablet Take 1 tablet (25 mg total) by mouth daily after breakfast. 30 tablet 0   No current facility-administered medications for this visit.    PHYSICAL EXAMINATION: ECOG PERFORMANCE STATUS: 1 - Symptomatic but completely ambulatory  Filed Vitals:   02/01/15  0954  BP: 126/76  Pulse: 97  Temp: 98.5 F (36.9 C)  Resp: 18   Filed Weights   02/01/15 0954  Weight: 180 lb 14.4 oz (82.056 kg)    GENERAL:alert, no distress and comfortable SKIN: skin  color, texture, turgor are normal, no rashes or significant lesions EYES: normal, Conjunctiva are pink and non-injected, sclera clear OROPHARYNX:no exudate, no erythema and lips, buccal mucosa, and tongue normal  NECK: supple, thyroid normal size, non-tender, without nodularity LYMPH:  no palpable lymphadenopathy in the cervical, axillary or inguinal LUNGS: clear to auscultation and percussion with normal breathing effort HEART: regular rate & rhythm and no murmurs and no lower extremity edema ABDOMEN:abdomen soft, non-tender and normal bowel sounds Musculoskeletal:no cyanosis of digits and no clubbing  NEURO: alert & oriented x 3 with fluent speech, no focal motor/sensory deficits  LABORATORY DATA:  I have reviewed the data as listed   Chemistry      Component Value Date/Time   NA 142 10/31/2014 0921   NA 138 07/22/2012 0851   K 4.0 10/31/2014 0921   K 4.0 07/22/2012 0851   CL 103 01/07/2013 1137   CL 104 07/22/2012 0851   CO2 29 10/31/2014 0921   CO2 27 07/22/2012 0851   BUN 11.4 10/31/2014 0921   BUN 7 07/22/2012 0851   CREATININE 0.9 10/31/2014 0921   CREATININE 0.92 07/22/2012 0851      Component Value Date/Time   CALCIUM 9.3 10/31/2014 0921   CALCIUM 9.1 07/22/2012 0851   ALKPHOS 67 10/31/2014 0921   ALKPHOS 66 07/22/2012 0851   AST 26 10/31/2014 0921   AST 28 07/22/2012 0851   ALT 26 10/31/2014 0921   ALT 36* 07/22/2012 0851   BILITOT 0.30 10/31/2014 0921   BILITOT 0.3 07/22/2012 0851       Lab Results  Component Value Date   WBC 6.4 02/01/2015   HGB 12.6 02/01/2015   HCT 38.4 02/01/2015   MCV 86.3 02/01/2015   PLT 261 02/01/2015   NEUTROABS 3.6 02/01/2015    ASSESSMENT & PLAN:  Breast cancer of upper-outer quadrant of right female breast Right breast invasive ductal carcinoma stage II diagnosed 05/01/2000 treated with lumpectomy and adjuvant chemotherapy CALGB 53614 study followed by 5 years of tamoxifen Recurrent right breast cancer 05/31/2009 status  post mastectomy followed by adjuvant chemotherapy with Taxol and carboplatin Recurrent breast cancer with chest wall recurrence diagnosed at [facility] status post resection and axillary lymph node dissection followed by CMF chemotherapy followed by radiation  Current treatment: Antiestrogen therapy with tamoxifen started 09/30/2012 status post bilateral salpingo-oophorectomy started Femara in January 2016 could not tolerate it switching to Aromasin 02/01/2015  Breast cancer surveillance 1. Breast exams to be done every 6 months 2. Mammogram on the left breast annually  If the patient can tolerate Aromasin, will then give her a 3 month supply. If she cannot tolerate Aromasin, we will then switch her back to tamoxifen.  Return to clinic in 6 months for follow-up.    No orders of the defined types were placed in this encounter.   The patient has a good understanding of the overall plan. she agrees with it. She will call with any problems that may develop before her next visit here.   Rulon Eisenmenger, MD

## 2015-03-09 ENCOUNTER — Other Ambulatory Visit: Payer: Self-pay

## 2015-03-09 ENCOUNTER — Telehealth: Payer: Self-pay | Admitting: *Deleted

## 2015-03-09 DIAGNOSIS — C50411 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 female breast: Secondary | ICD-10-CM

## 2015-03-09 MED ORDER — TAMOXIFEN CITRATE 20 MG PO TABS: 20.0000 mg | ORAL_TABLET | Freq: Every day | ORAL | Status: DC

## 2015-03-09 NOTE — Progress Notes (Signed)
Per note below and MD office note dtd 02/01/15, aromasin discontinued and prescription for tamoxifen sent to pharmacy.  Let pt know prescription was sent - she voiced understanding.     CAMIYA VINAL  03/09/2015  Telephone  MRN:  [id number]   Description: 44 year old female  Provider: Otila Kluver, RN  Department: [facility]       Reason for Call     Medication Problem    cannot tolerate aromasin         Call Documentation      Otila Kluver, RN at 03/09/2015 12:12 PM     Status: Signed       Expand All Collapse All   TC from pt. Stating that she is unable to tolerate Aromasin (experiencing increased aches and pains) and is asking for new prescription for Tamoxifen to be called in to her pharmacy ([place]) See Dr. Geralyn Flash last office visit note.

## 2015-03-09 NOTE — Telephone Encounter (Signed)
TC from pt. Stating that she is unable to tolerate Aromasin (experiencing increased aches and pains) and is asking for new prescription for Tamoxifen to be called in to her pharmacy ([place])  See Dr. Geralyn Flash last office visit note.

## 2015-04-25 ENCOUNTER — Telehealth: Payer: Self-pay | Admitting: *Deleted

## 2015-04-25 DIAGNOSIS — B373 Candidiasis of vulva and vagina: Secondary | ICD-10-CM

## 2015-04-25 DIAGNOSIS — B3731 Acute candidiasis of vulva and vagina: Secondary | ICD-10-CM

## 2015-04-25 MED ORDER — FLUCONAZOLE 150 MG PO TABS: 150.0000 mg | ORAL_TABLET | Freq: Once | ORAL | Status: DC

## 2015-04-25 NOTE — Telephone Encounter (Signed)
Pt called requesting a RF on Diflucan 150 mg for vaginal yeast.

## 2015-06-01 ENCOUNTER — Other Ambulatory Visit: Payer: Self-pay | Admitting: Hematology and Oncology

## 2015-07-20 ENCOUNTER — Telehealth: Payer: Self-pay | Admitting: Hematology and Oncology

## 2015-07-20 NOTE — Telephone Encounter (Signed)
Spoke with patient and she is aware that dr Lindi Adie will be out of the office 9/9 and she will call back to reschedule

## 2015-08-10 ENCOUNTER — Ambulatory Visit: Payer: 59 | Admitting: Hematology and Oncology

## 2015-08-15 ENCOUNTER — Other Ambulatory Visit: Payer: Self-pay | Admitting: Hematology and Oncology

## 2015-08-15 NOTE — Telephone Encounter (Signed)
Chart reviewed.  Pt cancelled last appt - only filled for 30 days.  Pharmacy notified to inform pt to call for appt

## 2015-10-30 ENCOUNTER — Other Ambulatory Visit: Payer: Self-pay | Admitting: Hematology and Oncology

## 2015-10-30 ENCOUNTER — Other Ambulatory Visit: Payer: Self-pay

## 2015-10-30 NOTE — Telephone Encounter (Signed)
Chart reviewed.

## 2015-10-31 ENCOUNTER — Telehealth: Payer: Self-pay | Admitting: Hematology and Oncology

## 2015-10-31 NOTE — Telephone Encounter (Signed)
s.w. pt and advised on Jan appt.....pt ok and aware °

## 2015-11-16 NOTE — Progress Notes (Signed)
This encounter was created in error - please disregard.

## 2015-12-06 ENCOUNTER — Other Ambulatory Visit: Payer: Self-pay

## 2015-12-06 DIAGNOSIS — Z1231 Encounter for screening mammogram for malignant neoplasm of breast: Secondary | ICD-10-CM

## 2015-12-06 DIAGNOSIS — Z9011 Acquired absence of right breast and nipple: Secondary | ICD-10-CM

## 2015-12-06 DIAGNOSIS — C50411 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 female breast: Secondary | ICD-10-CM

## 2015-12-10 ENCOUNTER — Ambulatory Visit: Payer: 59

## 2015-12-10 ENCOUNTER — Ambulatory Visit: Payer: 59 | Admitting: Hematology and Oncology

## 2015-12-10 ENCOUNTER — Other Ambulatory Visit: Payer: 59

## 2015-12-14 ENCOUNTER — Other Ambulatory Visit: Payer: Self-pay | Admitting: Hematology and Oncology

## 2015-12-14 NOTE — Telephone Encounter (Signed)
Chart reviewed.

## 2015-12-18 NOTE — Assessment & Plan Note (Addendum)
Right breast invasive ductal carcinoma stage II diagnosed 05/01/2000 treated with lumpectomy and adjuvant chemotherapy CALGB 60454 study followed by 5 years of tamoxifen Recurrent right breast cancer 05/31/2009 status post mastectomy followed by adjuvant chemotherapy with Taxol and carboplatin Recurrent breast cancer with chest wall recurrence diagnosed at [facility] status post resection and axillary lymph node dissection followed by CMF chemotherapy followed by radiation  Current treatment: Antiestrogen therapy with tamoxifen started 09/30/2012 status post bilateral salpingo-oophorectomy started Femara in January 2016 could not tolerate it switched to Aromasin 02/01/2015  Breast cancer surveillance 1. Breast exams 12/19/2015: No palpable abnormalities of concern 2. Mammogram 12/10/2015:   Return to clinic in 1 year for follow-up

## 2015-12-19 ENCOUNTER — Ambulatory Visit (HOSPITAL_BASED_OUTPATIENT_CLINIC_OR_DEPARTMENT_OTHER): Payer: 59 | Admitting: Hematology and Oncology

## 2015-12-19 ENCOUNTER — Telehealth: Payer: Self-pay | Admitting: Hematology and Oncology

## 2015-12-19 ENCOUNTER — Other Ambulatory Visit (HOSPITAL_BASED_OUTPATIENT_CLINIC_OR_DEPARTMENT_OTHER): Payer: 59

## 2015-12-19 ENCOUNTER — Encounter: Payer: Self-pay | Admitting: Hematology and Oncology

## 2015-12-19 VITALS — BP 120/69 | HR 77 | Temp 98.2°F | Resp 18 | Ht 67.0 in | Wt 174.7 lb

## 2015-12-19 DIAGNOSIS — C50411 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 female breast: Secondary | ICD-10-CM

## 2015-12-19 LAB — CBC WITH DIFFERENTIAL/PLATELET
BASO%: 0.6 % (ref 0.0–2.0)
Basophils Absolute: 0 10*3/uL (ref 0.0–0.1)
EOS%: 2.5 % (ref 0.0–7.0)
Eosinophils Absolute: 0.2 10*3/uL (ref 0.0–0.5)
HEMATOCRIT: 37.2 % (ref 34.8–46.6)
HEMOGLOBIN: 12.1 g/dL (ref 11.6–15.9)
LYMPH#: 2.1 10*3/uL (ref 0.9–3.3)
LYMPH%: 28.7 % (ref 14.0–49.7)
MCH: 28.3 pg (ref 25.1–34.0)
MCHC: 32.5 g/dL (ref 31.5–36.0)
MCV: 87.1 fL (ref 79.5–101.0)
MONO#: 0.4 10*3/uL (ref 0.1–0.9)
MONO%: 5.3 % (ref 0.0–14.0)
NEUT%: 62.9 % (ref 38.4–76.8)
NEUTROS ABS: 4.5 10*3/uL (ref 1.5–6.5)
Platelets: 267 10*3/uL (ref 145–400)
RBC: 4.27 10*6/uL (ref 3.70–5.45)
RDW: 13.5 % (ref 11.2–14.5)
WBC: 7.2 10*3/uL (ref 3.9–10.3)

## 2015-12-19 LAB — COMPREHENSIVE METABOLIC PANEL
ALT: 26 U/L (ref 0–55)
AST: 22 U/L (ref 5–34)
Albumin: 3.9 g/dL (ref 3.5–5.0)
Alkaline Phosphatase: 67 U/L (ref 40–150)
Anion Gap: 9 mEq/L (ref 3–11)
BUN: 12.9 mg/dL (ref 7.0–26.0)
CHLORIDE: 105 meq/L (ref 98–109)
CO2: 28 meq/L (ref 22–29)
CREATININE: 0.9 mg/dL (ref 0.6–1.1)
Calcium: 9.3 mg/dL (ref 8.4–10.4)
EGFR: 75 mL/min/{1.73_m2} — ABNORMAL LOW (ref >90)
Glucose: 94 mg/dl (ref 70–140)
Potassium: 3.9 mEq/L (ref 3.5–5.1)
SODIUM: 142 meq/L (ref 136–145)
Total Bilirubin: 0.5 mg/dL (ref 0.20–1.20)
Total Protein: 7.6 g/dL (ref 6.4–8.3)

## 2015-12-19 MED ORDER — ANASTROZOLE 1 MG PO TABS: 1.0000 mg | ORAL_TABLET | Freq: Every day | ORAL | Status: DC

## 2015-12-19 NOTE — Telephone Encounter (Signed)
Appointments made and avs given. °

## 2015-12-19 NOTE — Progress Notes (Signed)
Patient Care Team: No Pcp Per Patient as PCP - General (General Practice)  DIAGNOSIS: No matching staging information was found for the patient.  SUMMARY OF ONCOLOGIC HISTORY:   Breast cancer of upper-outer quadrant of right female breast ([place])   05/01/2000 Initial Biopsy Right breast invasive ductal carcinoma stage II treated with lumpectomy followed by chemotherapy Adriamycin and Taxotere on CALGB 61607 study followed by 5 years of tamoxifen   05/31/2009 Relapse/Recurrence Right breast recurrent cancer: 1.6 cm with LV I will negative status post mastectomy followed by adjuvant chemotherapy Taxol and carboplatin completed December 2010   03/31/2012 Relapse/Recurrence Chest wall recurrence diagnosed at [facility] ER/PR positive status post right axillary lymph node dissection for lymph nodes negative: 3.1 cm adenocarcinoma with perineural invasion, could be soft tissue met versus lymph node   06/10/2012 - 09/23/2012 Chemotherapy CMF chemotherapy x6 cycles   07/15/2012 - 08/19/2012 Radiation Therapy Concurrent with radiation patient received Cytoxan and 5-FU cycles 2 to 4   09/30/2012 -  Anti-estrogen oral therapy Tamoxifen 20 mg daily    Surgery Bilateral salpingo-oophorectomy    CHIEF COMPLIANT: follow-up on Aromasin  INTERVAL HISTORY: Breanna Lara is a 45 year old with above-mentioned history of right breast cancer who had recurrence of the disease in 2010 and in 2013. She received adjuvant chemotherapy followed by radiation and has been on oral antiestrogen therapy since October 2013. She was on tamoxifen and after she underwent bilateral salpingo-oophorectomy, she was switched to aromatase inhibitor therapy with Aromasin. Previously she would not tolerate letrozole. Patient is complaining of muscle aches and pains and would like to try another antiestrogen therapy. She was also complaining of discomfort in the left breast. She also noticed some increased hair loss and vaginal  dryness.  REVIEW OF SYSTEMS:   Constitutional: Denies fevers, chills or abnormal weight loss Eyes: Denies blurriness of vision Ears, nose, mouth, throat, and face: Denies mucositis or sore throat Respiratory: Denies cough, dyspnea or wheezes Cardiovascular: Denies palpitation, chest discomfort Gastrointestinal:  Denies nausea, heartburn or change in bowel habits Skin: Denies abnormal skin rashes Lymphatics: Denies new lymphadenopathy or easyand then she can repeat it again one month bruising Neurological:Denies numbness, tingling or new weaknesses Behavioral/Psych: Mood is stable, no new changes  Extremities: No lower extremity edema Breast:  denies any pain or lumps or nodules in either breasts All other systems were reviewed with the patient and are negative.  I have reviewed the past medical history, past surgical history, social history and family history with the patient and they are unchanged from previous note.  ALLERGIES:  is allergic to percocet.  MEDICATIONS:  Current Outpatient Prescriptions  Medication Sig Dispense Refill  . anastrozole (ARIMIDEX) 1 MG tablet Take 1 tablet (1 mg total) by mouth daily. 30 tablet 0  . calcium carbonate (OS-CAL) 600 MG TABS Take 1 tablet (600 mg total) by mouth 2 (two) times daily with a meal. 60 tablet 12  . cholecalciferol (VITAMIN D) 1000 UNITS tablet Take 1,000 Units by mouth daily.      . fexofenadine (ALLEGRA) 30 MG tablet Take 30 mg by mouth as needed.    . fluconazole (DIFLUCAN) 150 MG tablet Take 1 tablet (150 mg total) by mouth once. 1 tablet 3  . ibuprofen (ADVIL,MOTRIN) 600 MG tablet Take 1 tablet (600 mg total) by mouth every 6 (six) hours as needed for pain. 30 tablet 0  . pramoxine-hydrocortisone (PROCTOCREAM-HC) 1-1 % rectal cream Place 1 applicator rectally 3 (three) times daily as needed.  30 g 1  . Probiotic Product (PROBIOTIC DAILY PO) Take 1 tablet by mouth daily.     No current facility-administered medications for this  visit.    PHYSICAL EXAMINATION: ECOG PERFORMANCE STATUS: 1 - Symptomatic but completely ambulatory  Filed Vitals:   12/19/15 1018  BP: 120/69  Pulse: 77  Temp: 98.2 F (36.8 C)  Resp: 18   Filed Weights   12/19/15 1018  Weight: 174 lb 11.2 oz (79.243 kg)    GENERAL:alert, no distress and comfortable SKIN: skin color, texture, turgor are normal, no rashes or significant lesions EYES: normal, Conjunctiva are pink and non-injected, sclera clear OROPHARYNX:no exudate, no erythema and lips, buccal mucosa, and tongue normal  NECK: supple, thyroid normal size, non-tender, without nodularity LYMPH:  no palpable lymphadenopathy in the cervical, axillary or inguinal LUNGS: clear to auscultation and percussion with normal breathing effort HEART: regular rate & rhythm and no murmurs and no lower extremity edema ABDOMEN:abdomen soft, non-tender and normal bowel sounds MUSCULOSKELETAL:no cyanosis of digits and no clubbing  NEURO: alert & oriented x 3 with fluent speech, no focal motor/sensory deficits EXTREMITIES: No lower extremity edema BREAST: No palpable masses or nodules in either right or left breasts. No palpable axillary supraclavicular or infraclavicular adenopathy no breast tenderness or nipple discharge. (exam performed in the presence of a chaperone)  LABORATORY DATA:  I have reviewed the data as listed   Chemistry      Component Value Date/Time   NA 142 12/19/2015 1003   NA 138 07/22/2012 0851   K 3.9 12/19/2015 1003   K 4.0 07/22/2012 0851   CL 103 01/07/2013 1137   CL 104 07/22/2012 0851   CO2 28 12/19/2015 1003   CO2 27 07/22/2012 0851   BUN 12.9 12/19/2015 1003   BUN 7 07/22/2012 0851   CREATININE 0.9 12/19/2015 1003   CREATININE 0.92 07/22/2012 0851      Component Value Date/Time   CALCIUM 9.3 12/19/2015 1003   CALCIUM 9.1 07/22/2012 0851   ALKPHOS 67 12/19/2015 1003   ALKPHOS 66 07/22/2012 0851   AST 22 12/19/2015 1003   AST 28 07/22/2012 0851   ALT 26  12/19/2015 1003   ALT 36* 07/22/2012 0851   BILITOT 0.50 12/19/2015 1003   BILITOT 0.3 07/22/2012 0851       Lab Results  Component Value Date   WBC 7.2 12/19/2015   HGB 12.1 12/19/2015   HCT 37.2 12/19/2015   MCV 87.1 12/19/2015   PLT 267 12/19/2015   NEUTROABS 4.5 12/19/2015   ASSESSMENT & PLAN:  Breast cancer of upper-outer quadrant of right female breast ([place]) Right breast invasive ductal carcinoma stage II diagnosed 05/01/2000 treated with lumpectomy and adjuvant chemotherapy CALGB 46962 study followed by 5 years of tamoxifen Recurrent right breast cancer 05/31/2009 status post mastectomy followed by adjuvant chemotherapy with Taxol and carboplatin Recurrent breast cancer with chest wall recurrence diagnosed at [facility] status post resection and axillary lymph node dissection followed by CMF chemotherapy followed by radiation  Current treatment: Antiestrogen therapy with tamoxifen started 09/30/2012 status post bilateral salpingo-oophorectomy started Femara in January 2016 could not tolerate it switched to Aromasin 02/01/2015 switched to anastrozole 1 mg daily 12/19/2015 because of muscle aches and pains. Patient stands on her feet all day and this caused a lot of muscle aches and pains. I encouraged her to take tonic water at bedtime.  Hair loss: Encouraged her to use Coconut oil before shower Vaginal dryness: Discussed different options including coconut  oil, Replens, one of these of vaginal laser therapy.  I gave her a 30 day supply of anastrozole. If she cannot tolerate it then we will switch her to tamoxifen which she tolerated very well previously. She will call us in 30 days to let ask for a 90 day supply.  Breast cancer surveillance 1. Breast exams 12/19/2015: No palpable abnormalities of concern 2. Mammogram to be done next week.  Return to clinic in 3 months for follow-up to assess tolerability to antiestrogen therapy.   No orders of the defined types were  placed in this encounter.   The patient has a good understanding of the overall plan. she agrees with it. she will call with any problems that may develop before the next visit here.   Rulon Eisenmenger, MD 12/19/2015

## 2015-12-28 ENCOUNTER — Other Ambulatory Visit: Payer: Self-pay

## 2015-12-28 ENCOUNTER — Ambulatory Visit
Admission: RE | Admit: 2015-12-28 | Discharge: 2015-12-28 | Disposition: A | Discharge status: Home / Self Care | Payer: 59 | Source: Ambulatory Visit

## 2015-12-28 DIAGNOSIS — C50411 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 female breast: Secondary | ICD-10-CM

## 2015-12-28 DIAGNOSIS — Z9011 Acquired absence of right breast and nipple: Secondary | ICD-10-CM

## 2015-12-28 DIAGNOSIS — Z1231 Encounter for screening mammogram for malignant neoplasm of breast: Secondary | ICD-10-CM

## 2015-12-28 NOTE — Progress Notes (Signed)
Let pt know diagnostic 3d mammo ordered.  Encouraged pt to verify insurance prior to image.  Pt voiced understanding.

## 2015-12-31 ENCOUNTER — Other Ambulatory Visit: Payer: Self-pay | Admitting: Hematology and Oncology

## 2015-12-31 DIAGNOSIS — N644 Mastodynia: Secondary | ICD-10-CM

## 2015-12-31 DIAGNOSIS — Z853 Personal history of malignant neoplasm of breast: Secondary | ICD-10-CM

## 2016-01-08 ENCOUNTER — Ambulatory Visit
Admission: RE | Admit: 2016-01-08 | Discharge: 2016-01-08 | Disposition: A | Discharge status: Home / Self Care | Payer: 59 | Source: Ambulatory Visit | Attending: Hematology and Oncology | Admitting: Hematology and Oncology

## 2016-01-08 DIAGNOSIS — N644 Mastodynia: Secondary | ICD-10-CM

## 2016-01-08 DIAGNOSIS — Z853 Personal history of malignant neoplasm of breast: Secondary | ICD-10-CM

## 2016-03-17 ENCOUNTER — Ambulatory Visit: Payer: 59 | Admitting: Hematology and Oncology

## 2016-04-09 ENCOUNTER — Ambulatory Visit (HOSPITAL_BASED_OUTPATIENT_CLINIC_OR_DEPARTMENT_OTHER): Payer: 59 | Admitting: Hematology and Oncology

## 2016-04-09 ENCOUNTER — Telehealth: Payer: Self-pay | Admitting: Hematology and Oncology

## 2016-04-09 ENCOUNTER — Encounter: Payer: Self-pay | Admitting: Hematology and Oncology

## 2016-04-09 VITALS — BP 120/78 | HR 88 | Temp 98.5°F | Resp 18 | Ht 67.0 in | Wt 183.3 lb

## 2016-04-09 DIAGNOSIS — C50411 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 female breast: Secondary | ICD-10-CM

## 2016-04-09 MED ORDER — TAMOXIFEN CITRATE 20 MG PO TABS: 20.0000 mg | ORAL_TABLET | Freq: Every day | ORAL | Status: DC

## 2016-04-09 NOTE — Assessment & Plan Note (Signed)
Right breast invasive ductal carcinoma stage II diagnosed 05/01/2000 treated with lumpectomy and adjuvant chemotherapy CALGB 16109 study followed by 5 years of tamoxifen Recurrent right breast cancer 05/31/2009 status post mastectomy followed by adjuvant chemotherapy with Taxol and carboplatin Recurrent breast cancer with chest wall recurrence diagnosed at [facility] status post resection and axillary lymph node dissection followed by CMF chemotherapy followed by radiation  Current treatment: Antiestrogen therapy with tamoxifen started 09/30/2012 status post bilateral salpingo-oophorectomy started Femara in January 2016 could not tolerate it switched to Aromasin 02/01/2015, could not tolerate anastrozole either. Switching to Tamoxifen 20 daily 04/09/16  Breast cancer surveillance 1. Breast exams 12/19/2015: No palpable abnormalities of concern 2. Mammogram 12/10/2015:   Return to clinic in 6 months for follow-up

## 2016-04-09 NOTE — Progress Notes (Signed)
Patient Care Team: No Pcp Per Patient as PCP - General (General Practice)  SUMMARY OF ONCOLOGIC HISTORY:   Breast cancer of upper-outer quadrant of right female breast ([place])   05/01/2000 Initial Biopsy Right breast invasive ductal carcinoma stage II treated with lumpectomy followed by chemotherapy Adriamycin and Taxotere on CALGB 17494 study followed by 5 years of tamoxifen   05/31/2009 Relapse/Recurrence Right breast recurrent cancer: 1.6 cm with LV I will negative status post mastectomy followed by adjuvant chemotherapy Taxol and carboplatin completed December 2010   03/31/2012 Relapse/Recurrence Chest wall recurrence diagnosed at [facility] ER/PR positive status post right axillary lymph node dissection for lymph nodes negative: 3.1 cm adenocarcinoma with perineural invasion, could be soft tissue met versus lymph node   06/10/2012 - 09/23/2012 Chemotherapy CMF chemotherapy x6 cycles   07/15/2012 - 08/19/2012 Radiation Therapy Concurrent with radiation patient received Cytoxan and 5-FU cycles 2 to 4   09/30/2012 -  Anti-estrogen oral therapy Tamoxifen 20 mg daily    Surgery Bilateral salpingo-oophorectomy    CHIEF COMPLIANT: F/U on anastrozole  INTERVAL HISTORY: Breanna Lara is a 45 yr old with H/O rt breast cancer who is currently on anastrozole and is here for tox check. She couldn't tolerate anastrozole either. Lots of aching and pains in extremities.  REVIEW OF SYSTEMS:   Constitutional: Denies fevers, chills or abnormal weight loss Eyes: Denies blurriness of vision Ears, nose, mouth, throat, and face: Denies mucositis or sore throat Respiratory: Denies cough, dyspnea or wheezes Cardiovascular: Denies palpitation, chest discomfort Gastrointestinal:  Denies nausea, heartburn or change in bowel habits Skin: Denies abnormal skin rashes Lymphatics: Denies new lymphadenopathy or easy bruising Neurological:Denies numbness, tingling or new weaknesses Behavioral/Psych: Mood is stable,  no new changes  Extremities: No lower extremity edema Breast: denies any pain or lumps or nodules in either breasts All other systems were reviewed with the patient and are negative.  I have reviewed the past medical history, past surgical history, social history and family history with the patient and they are unchanged from previous note.  ALLERGIES:  is allergic to percocet.  MEDICATIONS:  Current Outpatient Prescriptions  Medication Sig Dispense Refill  . calcium carbonate (OS-CAL) 600 MG TABS Take 1 tablet (600 mg total) by mouth 2 (two) times daily with a meal. 60 tablet 12  . cholecalciferol (VITAMIN D) 1000 UNITS tablet Take 1,000 Units by mouth daily.      . fexofenadine (ALLEGRA) 30 MG tablet Take 30 mg by mouth as needed.    . fluconazole (DIFLUCAN) 150 MG tablet Take 1 tablet (150 mg total) by mouth once. 1 tablet 3  . ibuprofen (ADVIL,MOTRIN) 600 MG tablet Take 1 tablet (600 mg total) by mouth every 6 (six) hours as needed for pain. 30 tablet 0  . pramoxine-hydrocortisone (PROCTOCREAM-HC) 1-1 % rectal cream Place 1 applicator rectally 3 (three) times daily as needed. 30 g 1  . Probiotic Product (PROBIOTIC DAILY PO) Take 1 tablet by mouth daily.    . tamoxifen (NOLVADEX) 20 MG tablet Take 1 tablet (20 mg total) by mouth daily. 90 tablet 3   No current facility-administered medications for this visit.    PHYSICAL EXAMINATION: ECOG PERFORMANCE STATUS: 1  Filed Vitals:   04/09/16 1423  BP: 120/78  Pulse: 88  Temp: 98.5 F (36.9 C)  Resp: 18   Filed Weights   04/09/16 1423  Weight: 183 lb 4.8 oz (83.144 kg)    GENERAL:alert, no distress and comfortable SKIN: skin color, texture,  turgor are normal, no rashes or significant lesions EYES: normal, Conjunctiva are pink and non-injected, sclera clear OROPHARYNX:no exudate, no erythema and lips, buccal mucosa, and tongue normal  NECK: supple, thyroid normal size, non-tender, without nodularity LYMPH:  no palpable  lymphadenopathy in the cervical, axillary or inguinal LUNGS: clear to auscultation and percussion with normal breathing effort HEART: regular rate & rhythm and no murmurs and no lower extremity edema ABDOMEN:abdomen soft, non-tender and normal bowel sounds MUSCULOSKELETAL:no cyanosis of digits and no clubbing  NEURO: alert & oriented x 3 with fluent speech, no focal motor/sensory deficits EXTREMITIES: No lower extremity edema BREAST:No palpable masses or nodules in either right or left breasts. No palpable axillary supraclavicular or infraclavicular adenopathy no breast tenderness or nipple discharge. (exam performed in the presence of a chaperone)  LABORATORY DATA:  I have reviewed the data as listed   Chemistry      Component Value Date/Time   NA 142 12/19/2015 1003   NA 138 07/22/2012 0851   K 3.9 12/19/2015 1003   K 4.0 07/22/2012 0851   CL 103 01/07/2013 1137   CL 104 07/22/2012 0851   CO2 28 12/19/2015 1003   CO2 27 07/22/2012 0851   BUN 12.9 12/19/2015 1003   BUN 7 07/22/2012 0851   CREATININE 0.9 12/19/2015 1003   CREATININE 0.92 07/22/2012 0851      Component Value Date/Time   CALCIUM 9.3 12/19/2015 1003   CALCIUM 9.1 07/22/2012 0851   ALKPHOS 67 12/19/2015 1003   ALKPHOS 66 07/22/2012 0851   AST 22 12/19/2015 1003   AST 28 07/22/2012 0851   ALT 26 12/19/2015 1003   ALT 36* 07/22/2012 0851   BILITOT 0.50 12/19/2015 1003   BILITOT 0.3 07/22/2012 0851       Lab Results  Component Value Date   WBC 7.2 12/19/2015   HGB 12.1 12/19/2015   HCT 37.2 12/19/2015   MCV 87.1 12/19/2015   PLT 267 12/19/2015   NEUTROABS 4.5 12/19/2015     ASSESSMENT & PLAN:  Breast cancer of upper-outer quadrant of right female breast ([place]) Right breast invasive ductal carcinoma stage II diagnosed 05/01/2000 treated with lumpectomy and adjuvant chemotherapy CALGB 09233 study followed by 5 years of tamoxifen Recurrent right breast cancer 05/31/2009 status post mastectomy followed by  adjuvant chemotherapy with Taxol and carboplatin Recurrent breast cancer with chest wall recurrence diagnosed at [facility] status post resection and axillary lymph node dissection followed by CMF chemotherapy followed by radiation  Current treatment: Antiestrogen therapy with tamoxifen started 09/30/2012 status post bilateral salpingo-oophorectomy started Femara in January 2016 could not tolerate it switched to Aromasin 02/01/2015, could not tolerate anastrozole either. Switching to Tamoxifen 20 daily 04/09/16  Breast cancer surveillance 1. Breast exams 12/19/2015: No palpable abnormalities of concern 2. Mammogram 12/10/2015:   Return to clinic in 6 months for follow-up       No orders of the defined types were placed in this encounter.   The patient has a good understanding of the overall plan. she agrees with it. she will call with any problems that may develop before the next visit here.   Rulon Eisenmenger, MD 04/09/2016

## 2016-04-09 NOTE — Telephone Encounter (Signed)
appt made and avs printed °

## 2016-10-08 ENCOUNTER — Ambulatory Visit: Payer: 59 | Admitting: Hematology and Oncology

## 2016-10-13 ENCOUNTER — Encounter: Payer: Self-pay | Admitting: Obstetrics & Gynecology

## 2016-10-13 ENCOUNTER — Ambulatory Visit (INDEPENDENT_AMBULATORY_CARE_PROVIDER_SITE_OTHER): Payer: 59 | Admitting: Obstetrics & Gynecology

## 2016-10-13 VITALS — BP 124/83 | HR 84 | Ht 67.0 in | Wt 190.0 lb

## 2016-10-13 DIAGNOSIS — Z1151 Encounter for screening for human papillomavirus (HPV): Secondary | ICD-10-CM | POA: Diagnosis not present

## 2016-10-13 DIAGNOSIS — Z124 Encounter for screening for malignant neoplasm of cervix: Secondary | ICD-10-CM

## 2016-10-13 DIAGNOSIS — Z01419 Encounter for gynecological examination (general) (routine) without abnormal findings: Secondary | ICD-10-CM | POA: Diagnosis not present

## 2016-10-13 DIAGNOSIS — N951 Menopausal and female climacteric states: Secondary | ICD-10-CM | POA: Diagnosis not present

## 2016-10-14 NOTE — Progress Notes (Signed)
Subjective:    Breanna Lara is a 45 y.o. female who presents for an annual exam. The patient has no complaints today. The patient is sexually active. GYN screening history: last pap: was normal in 2013.  The patient wears seatbelts: yes. The pt is menopausal from BSO due to HR+ breast cancer.  Pt is on Tamoxifen.  The patient has had 2 primaries with one recurrence.   Pt under care of oncology and her breast surgeon.  Pt sometimes has vaginal dryness and is doing to try Replens.  She is not a candidate for vaginal estrogen.  Pt has some spotting post coital.  Pt never bleeds other than around sexual intercourse  Menstrual History: OB History    Gravida Para Term Preterm AB Living   4 2     2 2    SAB TAB Ectopic Multiple Live Births     1 1          The following portions of the patient's history were reviewed and updated as appropriate: allergies, current medications, past family history, past medical history, past social history, past surgical history and problem list.    Review of Systems Pertinent items noted in HPI and remainder of comprehensive ROS otherwise negative.    Objective:   Vitals:   10/13/16 1357  BP: 124/83  Pulse: 84  Weight: 190 lb (86.2 kg)  Height: 5\' 7"  (1.702 m)   Vitals:  WNL General appearance: alert, cooperative and no distress  HEENT: Normocephalic, without obvious abnormality, atraumatic Eyes: negative Throat: lips, mucosa, and tongue normal; teeth and gums normal  Respiratory: Clear to auscultation bilaterally  CV: Regular rate and rhythm  Breasts:  Normal appearance, no masses or tenderness, no nipple retraction or dimpling  GI: Soft, non-tender; bowel sounds normal; no masses,  no organomegaly  GU: External Genitalia:  Tanner V, no lesion Urethra:  No prolapse   Vagina: Pink, normal rugae, no blood or discharge  Cervix: No lesion but mild bleeding after pap  Uterus:  Normal size and contour, non tender  Adnexa: Normal, no masses, non tender   Musculoskeletal: No edema, redness or tenderness in the calves or thighs  Skin: No lesions or rash  Lymphatic: Axillary adenopathy: none     Psychiatric: Normal mood and behavior     Assessment:    Healthy female exam.   Surgical menopause Nml Dexa 2015 Breast cancer in remission   Plan:    Pap smear with co testing Replens Calcium/Vit D Dexa nml is 2014  Suggest rpt in 2019

## 2016-10-16 LAB — CYTOLOGY - PAP
DIAGNOSIS: NEGATIVE
HPV: NOT DETECTED

## 2016-10-29 NOTE — Assessment & Plan Note (Deleted)
  Right breast invasive ductal carcinoma stage II diagnosed 05/01/2000 treated with lumpectomy and adjuvant chemotherapy CALGB 91478 study followed by 5 years of tamoxifen Recurrent right breast cancer 05/31/2009 status post mastectomy followed by adjuvant chemotherapy with Taxol and carboplatin Recurrent breast cancer with chest wall recurrence 03/31/2012: diagnosed at [facility] status post resection and axillary lymph node dissection followed by CMF chemotherapy followed by radiation  Current treatment: Antiestrogen therapy with tamoxifen started 09/30/2012 status post bilateral salpingo-oophorectomy started Femara in January 2016 could not tolerate it switched to Aromasin 02/01/2015 switched to anastrozole 1 mg daily 12/19/2015 because of muscle aches and pains. Patient stands on her feet all day and this caused a lot of muscle aches and pains. I encouraged her to take tonic water at bedtime.  Hair loss: Encouraged her to use Coconut oil before shower Vaginal dryness: Discussed different options including coconut oil, Replens, one of these of vaginal laser therapy.  I gave her a 30 day supply of anastrozole. If she cannot tolerate it then we will switch her to tamoxifen which she tolerated very well previously. She will call us in 30 days to let ask for a 90 day supply.  Breast cancer surveillance 1. Breast exams 12/19/2015: No palpable abnormalities of concern 2. Mammogram to be done next week.  Return to clinic in 6 months for follow-up to assess tolerability to antiestrogen therapy.

## 2016-10-30 ENCOUNTER — Ambulatory Visit: Payer: 59 | Admitting: Hematology and Oncology

## 2016-10-30 NOTE — Progress Notes (Deleted)
Patient Care Team: No Pcp Per Patient as PCP - General (General Practice)  DIAGNOSIS:  Encounter Diagnosis  Name Primary?  . 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 breast in female, estrogen receptor positive ([place])     SUMMARY OF ONCOLOGIC HISTORY:   Breast cancer of upper-outer quadrant of right female breast ([place])   05/01/2000 Initial Biopsy    Right breast invasive ductal carcinoma stage II treated with lumpectomy followed by chemotherapy Adriamycin and Taxotere on CALGB 66294 study followed by 5 years of tamoxifen      05/31/2009 Relapse/Recurrence    Right breast recurrent cancer: 1.6 cm with LV I will negative status post mastectomy followed by adjuvant chemotherapy Taxol and carboplatin completed December 2010      03/31/2012 Relapse/Recurrence    Chest wall recurrence diagnosed at [facility] ER/PR positive status post right axillary lymph node dissection for lymph nodes negative: 3.1 cm adenocarcinoma with perineural invasion, could be soft tissue met versus lymph node      06/10/2012 - 09/23/2012 Chemotherapy    CMF chemotherapy x6 cycles      07/15/2012 - 08/19/2012 Radiation Therapy    Concurrent with radiation patient received Cytoxan and 5-FU cycles 2 to 4      09/30/2012 -  Anti-estrogen oral therapy    Tamoxifen 20 mg daily switched to Aromasin and then switched to anastrozole January 2017       Surgery    Bilateral salpingo-oophorectomy       CHIEF COMPLIANT: Follow-up on anastrozole therapy  INTERVAL HISTORY: Breanna Lara is a 45 year old with above-mentioned history of right breast cancer who had a chest wall recurrence and that was resected. She was treated with adjuvant chemotherapy and radiation and is currently on oral antiestrogen therapy. Previously she was on tamoxifen which was then switched to Aromasin and then later switched to anastrozole started January 2017. She continues to have leg aches and pains. Because she is on her feet  most of the day. She denies any lumps or nodules in the breasts.  REVIEW OF SYSTEMS:   Constitutional: Denies fevers, chills or abnormal weight loss Eyes: Denies blurriness of vision Ears, nose, mouth, throat, and face: Denies mucositis or sore throat Respiratory: Denies cough, dyspnea or wheezes Cardiovascular: Denies palpitation, chest discomfort Gastrointestinal:  Denies nausea, heartburn or change in bowel habits Skin: Denies abnormal skin rashes Lymphatics: Denies new lymphadenopathy or easy bruising Neurological:Denies numbness, tingling or new weaknesses Behavioral/Psych: Mood is stable, no new changes  Extremities: No lower extremity edema Breast:  denies any pain or lumps or nodules All other systems were reviewed with the patient and are negative.  I have reviewed the past medical history, past surgical history, social history and family history with the patient and they are unchanged from previous note.  ALLERGIES:  is allergic to percocet [oxycodone-acetaminophen].  MEDICATIONS:  Current Outpatient Prescriptions  Medication Sig Dispense Refill  . calcium carbonate (OS-CAL) 600 MG TABS Take 1 tablet (600 mg total) by mouth 2 (two) times daily with a meal. 60 tablet 12  . cholecalciferol (VITAMIN D) 1000 UNITS tablet Take 1,000 Units by mouth daily.      . fexofenadine (ALLEGRA) 30 MG tablet Take 30 mg by mouth as needed.    . Probiotic Product (PROBIOTIC DAILY PO) Take 1 tablet by mouth daily.    . tamoxifen (NOLVADEX) 20 MG tablet Take 1 tablet (20 mg total) by mouth daily. 90 tablet 3   No current facility-administered medications  for this visit.     PHYSICAL EXAMINATION: ECOG PERFORMANCE STATUS: 1 - Symptomatic but completely ambulatory  There were no vitals filed for this visit. There were no vitals filed for this visit.  GENERAL:alert, no distress and comfortable SKIN: skin color, texture, turgor are normal, no rashes or significant lesions EYES: normal,  Conjunctiva are pink and non-injected, sclera clear OROPHARYNX:no exudate, no erythema and lips, buccal mucosa, and tongue normal  NECK: supple, thyroid normal size, non-tender, without nodularity LYMPH:  no palpable lymphadenopathy in the cervical, axillary or inguinal LUNGS: clear to auscultation and percussion with normal breathing effort HEART: regular rate & rhythm and no murmurs and no lower extremity edema ABDOMEN:abdomen soft, non-tender and normal bowel sounds MUSCULOSKELETAL:no cyanosis of digits and no clubbing  NEURO: alert & oriented x 3 with fluent speech, no focal motor/sensory deficits EXTREMITIES: No lower extremity edema BREAST: No palpable masses or nodules in either right or left breasts. No palpable axillary supraclavicular or infraclavicular adenopathy no breast tenderness or nipple discharge. (exam performed in the presence of a chaperone)  LABORATORY DATA:  I have reviewed the data as listed   Chemistry      Component Value Date/Time   NA 142 12/19/2015 1003   K 3.9 12/19/2015 1003   CL 103 01/07/2013 1137   CO2 28 12/19/2015 1003   BUN 12.9 12/19/2015 1003   CREATININE 0.9 12/19/2015 1003      Component Value Date/Time   CALCIUM 9.3 12/19/2015 1003   ALKPHOS 67 12/19/2015 1003   AST 22 12/19/2015 1003   ALT 26 12/19/2015 1003   BILITOT 0.50 12/19/2015 1003       Lab Results  Component Value Date   WBC 7.2 12/19/2015   HGB 12.1 12/19/2015   HCT 37.2 12/19/2015   MCV 87.1 12/19/2015   PLT 267 12/19/2015   NEUTROABS 4.5 12/19/2015    ASSESSMENT & PLAN:  Breast cancer of upper-outer quadrant of right female breast ([place])  Right breast invasive ductal carcinoma stage II diagnosed 05/01/2000 treated with lumpectomy and adjuvant chemotherapy CALGB 38101 study followed by 5 years of tamoxifen Recurrent right breast cancer 05/31/2009 status post mastectomy followed by adjuvant chemotherapy with Taxol and carboplatin Recurrent breast cancer with chest  wall recurrence 03/31/2012: diagnosed at [facility] status post resection and axillary lymph node dissection followed by CMF chemotherapy followed by radiation  Current treatment: Antiestrogen therapy with tamoxifen started 09/30/2012 status post bilateral salpingo-oophorectomy started Femara in January 2016 could not tolerate it switched to Aromasin 02/01/2015 switched to anastrozole 1 mg daily 12/19/2015 because of muscle aches and pains. Patient stands on her feet all day and this caused a lot of muscle aches and pains. I encouraged her to take tonic water at bedtime.  Hair loss: Encouraged her to use Coconut oil before shower Vaginal dryness: Discussed different options including coconut oil, Replens, one of these of vaginal laser therapy.  I gave her a 30 day supply of anastrozole. If she cannot tolerate it then we will switch her to tamoxifen which she tolerated very well previously. She will call us in 30 days to let ask for a 90 day supply.  Breast cancer surveillance 1. Breast exams 12/19/2015: No palpable abnormalities of concern 2. Mammogram to be done next week.  Return to clinic in 1 year for follow-up  No orders of the defined types were placed in this encounter.  The patient has a good understanding of the overall plan. she agrees with it. she  will call with any problems that may develop before the next visit here.   Rulon Eisenmenger, MD 10/30/16

## 2016-11-20 ENCOUNTER — Ambulatory Visit (HOSPITAL_BASED_OUTPATIENT_CLINIC_OR_DEPARTMENT_OTHER): Payer: 59 | Admitting: Hematology and Oncology

## 2016-11-20 ENCOUNTER — Encounter: Payer: Self-pay | Admitting: Hematology and Oncology

## 2016-11-20 ENCOUNTER — Telehealth: Payer: Self-pay | Admitting: Hematology and Oncology

## 2016-11-20 DIAGNOSIS — C50411 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 female breast: Secondary | ICD-10-CM | POA: Diagnosis not present

## 2016-11-20 DIAGNOSIS — Z170 Estrogen receptor positive status [ER+]: Secondary | ICD-10-CM | POA: Diagnosis not present

## 2016-11-20 DIAGNOSIS — Z79810 Long term (current) use of selective estrogen receptor modulators (SERMs): Secondary | ICD-10-CM | POA: Diagnosis not present

## 2016-11-20 MED ORDER — TAMOXIFEN CITRATE 20 MG PO TABS: 20.0000 mg | ORAL_TABLET | Freq: Every day | ORAL | 3 refills | Status: DC

## 2016-11-20 MED ORDER — BLACK PEPPER-TURMERIC 3-500 MG PO CAPS: 1.0000 | ORAL_CAPSULE | Freq: Every day | ORAL | Status: AC

## 2016-11-20 NOTE — Progress Notes (Signed)
Patient Care Team: No Pcp Per Patient as PCP - General (General Practice)  DIAGNOSIS:  Encounter Diagnosis  Name Primary?  . 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 breast in female, estrogen receptor positive ([place])     SUMMARY OF ONCOLOGIC HISTORY:   Breast cancer of upper-outer quadrant of right female breast ([place])   05/01/2000 Initial Biopsy    Right breast invasive ductal carcinoma stage II treated with lumpectomy followed by chemotherapy Adriamycin and Taxotere on CALGB 34287 study followed by 5 years of tamoxifen      05/31/2009 Relapse/Recurrence    Right breast recurrent cancer: 1.6 cm with LVI Triple negative status post mastectomy followed by adjuvant chemotherapy Taxol and carboplatin completed December 2010      03/31/2012 Relapse/Recurrence    Chest wall recurrence diagnosed at [facility] ER/PR positive status post right axillary lymph node dissection for lymph nodes negative: 3.1 cm adenocarcinoma with perineural invasion, could be soft tissue met versus lymph node      06/10/2012 - 09/23/2012 Chemotherapy    CMF chemotherapy x6 cycles      07/15/2012 - 08/19/2012 Radiation Therapy    Concurrent with radiation patient received Cytoxan and 5-FU cycles 2 to 4      09/30/2012 -  Anti-estrogen oral therapy    Tamoxifen 20 mg daily switched to Aromasin and then switched to anastrozole January 2017       Surgery    Bilateral salpingo-oophorectomy       CHIEF COMPLIANT: Annual follow-up on tamoxifen  INTERVAL HISTORY: Breanna Lara is a 45 year old with above-mentioned history of recurrent breast cancer. She is currently on tamoxifen therapy because she could not tolerate any aromatase inhibitor therapies. She continues to have hot flashes but she is able to manage them. She has recently found to have plantar fasciitis. This is getting better as well. Denies any lumps or nodules in the breast.  REVIEW OF SYSTEMS:   Constitutional: Denies fevers,  chills or abnormal weight loss Eyes: Denies blurriness of vision Ears, nose, mouth, throat, and face: Denies mucositis or sore throat Respiratory: Denies cough, dyspnea or wheezes Cardiovascular: Denies palpitation, chest discomfort Gastrointestinal:  Denies nausea, heartburn or change in bowel habits Skin: Denies abnormal skin rashes Lymphatics: Denies new lymphadenopathy or easy bruising Neurological:Denies numbness, tingling or new weaknesses Behavioral/Psych: Mood is stable, no new changes  Extremities: No lower extremity edema Breast:  denies any pain or lumps or nodules in either breasts All other systems were reviewed with the patient and are negative.  I have reviewed the past medical history, past surgical history, social history and family history with the patient and they are unchanged from previous note.  ALLERGIES:  is allergic to percocet [oxycodone-acetaminophen].  MEDICATIONS:  Current Outpatient Prescriptions  Medication Sig Dispense Refill  . Black Pepper-Turmeric 3-500 MG CAPS Take 1 tablet by mouth daily.    . calcium carbonate (OS-CAL) 600 MG TABS Take 1 tablet (600 mg total) by mouth 2 (two) times daily with a meal. 60 tablet 12  . cholecalciferol (VITAMIN D) 1000 UNITS tablet Take 1,000 Units by mouth daily.      . fexofenadine (ALLEGRA) 30 MG tablet Take 30 mg by mouth as needed.    . Probiotic Product (PROBIOTIC DAILY PO) Take 1 tablet by mouth daily.    . tamoxifen (NOLVADEX) 20 MG tablet Take 1 tablet (20 mg total) by mouth daily. 90 tablet 3   No current facility-administered medications for this visit.  PHYSICAL EXAMINATION: ECOG PERFORMANCE STATUS: 1 - Symptomatic but completely ambulatory  Vitals:   11/20/16 1033  BP: 125/79  Pulse: 83  Resp: 20  Temp: 98.4 F (36.9 C)   Filed Weights   11/20/16 1033  Weight: 190 lb (86.2 kg)    GENERAL:alert, no distress and comfortable SKIN: skin color, texture, turgor are normal, no rashes or  significant lesions EYES: normal, Conjunctiva are pink and non-injected, sclera clear OROPHARYNX:no exudate, no erythema and lips, buccal mucosa, and tongue normal  NECK: supple, thyroid normal size, non-tender, without nodularity LYMPH:  no palpable lymphadenopathy in the cervical, axillary or inguinal LUNGS: clear to auscultation and percussion with normal breathing effort HEART: regular rate & rhythm and no murmurs and no lower extremity edema ABDOMEN:abdomen soft, non-tender and normal bowel sounds MUSCULOSKELETAL:no cyanosis of digits and no clubbing  NEURO: alert & oriented x 3 with fluent speech, no focal motor/sensory deficits EXTREMITIES: No lower extremity edema BREAST: No palpable masses or nodules in either right or left breasts. No palpable axillary supraclavicular or infraclavicular adenopathy no breast tenderness or nipple discharge. (exam performed in the presence of a chaperone)  LABORATORY DATA:  I have reviewed the data as listed   Chemistry      Component Value Date/Time   NA 142 12/19/2015 1003   K 3.9 12/19/2015 1003   CL 103 01/07/2013 1137   CO2 28 12/19/2015 1003   BUN 12.9 12/19/2015 1003   CREATININE 0.9 12/19/2015 1003      Component Value Date/Time   CALCIUM 9.3 12/19/2015 1003   ALKPHOS 67 12/19/2015 1003   AST 22 12/19/2015 1003   ALT 26 12/19/2015 1003   BILITOT 0.50 12/19/2015 1003       Lab Results  Component Value Date   WBC 7.2 12/19/2015   HGB 12.1 12/19/2015   HCT 37.2 12/19/2015   MCV 87.1 12/19/2015   PLT 267 12/19/2015   NEUTROABS 4.5 12/19/2015    ASSESSMENT & PLAN:  Breast cancer of upper-outer quadrant of right female breast ([place]) Right breast invasive ductal carcinoma stage II diagnosed 05/01/2000 treated with lumpectomy and adjuvant chemotherapy CALGB 11914 study followed by 5 years of tamoxifen Recurrent right breast cancer 05/31/2009 status post mastectomy followed by adjuvant chemotherapy with Taxol and  carboplatin Recurrent breast cancer with chest wall recurrence diagnosed at [facility] status post resection and axillary lymph node dissection followed by CMF chemotherapy followed by radiation  Current treatment: Antiestrogen therapy with tamoxifen started 09/30/2012 status post bilateral salpingo-oophorectomy started Femara in January 2016 could not tolerate it switched to Aromasin 02/01/2015, could not tolerate anastrozole either. Switched to Tamoxifen 20 daily 04/09/16  Our plans to continue treatment for 10 years.  Breast cancer surveillance 1. Breast exams 11/20/2016: No palpable abnormalities of concern 2. Mammogram 12/10/2015: Benign  Return to clinic in One year for follow-up   No orders of the defined types were placed in this encounter.  The patient has a good understanding of the overall plan. she agrees with it. she will call with any problems that may develop before the next visit here.   Rulon Eisenmenger, MD 11/20/16

## 2016-11-20 NOTE — Assessment & Plan Note (Signed)
Right breast invasive ductal carcinoma stage II diagnosed 05/01/2000 treated with lumpectomy and adjuvant chemotherapy CALGB 29562 study followed by 5 years of tamoxifen Recurrent right breast cancer 05/31/2009 status post mastectomy followed by adjuvant chemotherapy with Taxol and carboplatin Recurrent breast cancer with chest wall recurrence diagnosed at [facility] status post resection and axillary lymph node dissection followed by CMF chemotherapy followed by radiation  Current treatment: Antiestrogen therapy with tamoxifen started 09/30/2012 status post bilateral salpingo-oophorectomy started Femara in January 2016 could not tolerate it switched to Aromasin 02/01/2015, could not tolerate anastrozole either. Switched to Tamoxifen 20 daily 04/09/16  Our plans to continue treatment for 10 years.  Breast cancer surveillance 1. Breast exams 11/20/2016: No palpable abnormalities of concern 2. Mammogram 12/10/2015: Benign  Return to clinic in One year for follow-up

## 2016-11-20 NOTE — Telephone Encounter (Signed)
Appointments scheduled per 12/21 LOS. Patient given AVS report and calendars with future scheduled appointments.  °

## 2016-11-26 ENCOUNTER — Other Ambulatory Visit: Payer: Self-pay | Admitting: Nurse Practitioner

## 2016-12-02 ENCOUNTER — Ambulatory Visit: Payer: 59 | Admitting: Hematology and Oncology

## 2016-12-12 ENCOUNTER — Other Ambulatory Visit: Payer: Self-pay | Admitting: *Deleted

## 2016-12-12 MED ORDER — TAMOXIFEN CITRATE 20 MG PO TABS: 20.0000 mg | ORAL_TABLET | Freq: Every day | ORAL | 3 refills | Status: DC

## 2016-12-23 DIAGNOSIS — M9903 Segmental and somatic dysfunction of lumbar region: Secondary | ICD-10-CM | POA: Diagnosis not present

## 2016-12-23 DIAGNOSIS — M9901 Segmental and somatic dysfunction of cervical region: Secondary | ICD-10-CM | POA: Diagnosis not present

## 2016-12-23 DIAGNOSIS — M9902 Segmental and somatic dysfunction of thoracic region: Secondary | ICD-10-CM | POA: Diagnosis not present

## 2017-01-29 DIAGNOSIS — M9903 Segmental and somatic dysfunction of lumbar region: Secondary | ICD-10-CM | POA: Diagnosis not present

## 2017-01-29 DIAGNOSIS — M9901 Segmental and somatic dysfunction of cervical region: Secondary | ICD-10-CM | POA: Diagnosis not present

## 2017-01-29 DIAGNOSIS — M9902 Segmental and somatic dysfunction of thoracic region: Secondary | ICD-10-CM | POA: Diagnosis not present

## 2017-02-02 DIAGNOSIS — T1512XA Foreign body in conjunctival sac, left eye, initial encounter: Secondary | ICD-10-CM | POA: Diagnosis not present

## 2017-02-17 ENCOUNTER — Other Ambulatory Visit: Payer: Self-pay | Admitting: Hematology and Oncology

## 2017-02-17 DIAGNOSIS — Z9011 Acquired absence of right breast and nipple: Secondary | ICD-10-CM

## 2017-02-17 DIAGNOSIS — Z1231 Encounter for screening mammogram for malignant neoplasm of breast: Secondary | ICD-10-CM

## 2017-03-06 ENCOUNTER — Ambulatory Visit
Admission: RE | Admit: 2017-03-06 | Discharge: 2017-03-06 | Disposition: A | Discharge status: Home / Self Care | Payer: 59 | Source: Ambulatory Visit | Attending: Hematology and Oncology | Admitting: Hematology and Oncology

## 2017-03-06 DIAGNOSIS — Z1231 Encounter for screening mammogram for malignant neoplasm of breast: Secondary | ICD-10-CM

## 2017-03-06 DIAGNOSIS — Z9011 Acquired absence of right breast and nipple: Secondary | ICD-10-CM

## 2017-03-26 ENCOUNTER — Telehealth: Payer: Self-pay

## 2017-03-26 ENCOUNTER — Other Ambulatory Visit: Payer: Self-pay

## 2017-03-26 DIAGNOSIS — Z170 Estrogen receptor positive status [ER+]: Principal | ICD-10-CM

## 2017-03-26 DIAGNOSIS — M9901 Segmental and somatic dysfunction of cervical region: Secondary | ICD-10-CM | POA: Diagnosis not present

## 2017-03-26 DIAGNOSIS — M9902 Segmental and somatic dysfunction of thoracic region: Secondary | ICD-10-CM | POA: Diagnosis not present

## 2017-03-26 DIAGNOSIS — M9903 Segmental and somatic dysfunction of lumbar region: Secondary | ICD-10-CM | POA: Diagnosis not present

## 2017-03-26 DIAGNOSIS — C50411 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 female breast: Secondary | ICD-10-CM

## 2017-03-26 NOTE — Telephone Encounter (Signed)
Returned pt vm regarding concerns of lump in her R chest wall. Pt had mastectomy 2013. Pt states that she had noticed this for a few months now and wanted to see if this is something of a concern. Pt denies pain, redness, swelling and it does feel firm. Notified Dr.Gudena and suggest that pt gets US of the R chest wall and possible biopsy if needed. Pt agreeable and will schedule this for her. Will call pt back to confirm appt at the [facility] imaging. Pt verbalized understanding. Told pt that once the results are back, Dr.Gudena will review and may set up follow up appt with pt.  1400- Called pt to let her know that an appt for the [facility] breast center imaging was made for tomorrow. Pt states that she will need to reschedule because of work. She can try next week. Gave pt phone number to reschedule.

## 2017-03-27 ENCOUNTER — Other Ambulatory Visit: Payer: 59

## 2017-04-03 ENCOUNTER — Other Ambulatory Visit: Payer: Self-pay | Admitting: Hematology and Oncology

## 2017-04-03 ENCOUNTER — Ambulatory Visit
Admission: RE | Admit: 2017-04-03 | Discharge: 2017-04-03 | Disposition: A | Discharge status: Home / Self Care | Payer: 59 | Source: Ambulatory Visit | Attending: Hematology and Oncology | Admitting: Hematology and Oncology

## 2017-04-03 ENCOUNTER — Telehealth: Payer: Self-pay

## 2017-04-03 DIAGNOSIS — C50411 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 female breast: Secondary | ICD-10-CM

## 2017-04-03 DIAGNOSIS — Z0000 Encounter for general adult medical examination without abnormal findings: Secondary | ICD-10-CM | POA: Diagnosis not present

## 2017-04-03 DIAGNOSIS — C50919 Malignant neoplasm of unspecified site of unspecified female breast: Secondary | ICD-10-CM | POA: Diagnosis not present

## 2017-04-03 DIAGNOSIS — Z79899 Other long term (current) drug therapy: Secondary | ICD-10-CM | POA: Diagnosis not present

## 2017-04-03 DIAGNOSIS — Z170 Estrogen receptor positive status [ER+]: Principal | ICD-10-CM

## 2017-04-03 DIAGNOSIS — N6489 Other specified disorders of breast: Secondary | ICD-10-CM | POA: Diagnosis not present

## 2017-04-03 DIAGNOSIS — N6310 Unspecified lump in the right breast, unspecified quadrant: Secondary | ICD-10-CM

## 2017-04-03 NOTE — Telephone Encounter (Signed)
Spoke with Breanna Lara today regarding her ultrasound results being suspicious. Breanna Lara wants to speak with Dr.Gudena because she is very concerned if its cancerous. Breanna Lara states that she has had 3 cancers in the past and wants to make sure that Dr.Gudena looks at the ultrasound results himself to confirm that Breanna Lara will need biopsy done. Breanna Lara declined biopsy today, until further follow up. Notified Breanna Lara that Dr.Gudena is out of the office until Monday. Will let him know about Breanna Lara concerns. Breanna Lara appreciative of call.

## 2017-04-07 ENCOUNTER — Other Ambulatory Visit: Payer: Self-pay | Admitting: Hematology and Oncology

## 2017-04-07 DIAGNOSIS — C50411 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 female breast: Secondary | ICD-10-CM

## 2017-04-07 DIAGNOSIS — Z170 Estrogen receptor positive status [ER+]: Principal | ICD-10-CM

## 2017-04-07 DIAGNOSIS — N6310 Unspecified lump in the right breast, unspecified quadrant: Secondary | ICD-10-CM

## 2017-04-08 ENCOUNTER — Ambulatory Visit
Admission: RE | Admit: 2017-04-08 | Discharge: 2017-04-08 | Disposition: A | Discharge status: Home / Self Care | Payer: 59 | Source: Ambulatory Visit | Attending: Hematology and Oncology | Admitting: Hematology and Oncology

## 2017-04-08 DIAGNOSIS — N6310 Unspecified lump in the right breast, unspecified quadrant: Secondary | ICD-10-CM

## 2017-04-08 DIAGNOSIS — Z170 Estrogen receptor positive status [ER+]: Principal | ICD-10-CM

## 2017-04-08 DIAGNOSIS — C50411 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 female breast: Secondary | ICD-10-CM

## 2017-04-08 DIAGNOSIS — C50911 Malignant neoplasm of unspecified site of right female breast: Secondary | ICD-10-CM | POA: Diagnosis not present

## 2017-04-09 ENCOUNTER — Telehealth: Payer: Self-pay | Admitting: *Deleted

## 2017-04-09 NOTE — Telephone Encounter (Signed)
Scheduled and confirmed pt to see Dr. Lindi Adie on 5/17 at 0830. Denies further needs or questions at this time.

## 2017-04-14 DIAGNOSIS — C50911 Malignant neoplasm of unspecified site of right female breast: Secondary | ICD-10-CM | POA: Diagnosis not present

## 2017-04-14 DIAGNOSIS — C7989 Secondary malignant neoplasm of other specified sites: Secondary | ICD-10-CM | POA: Diagnosis not present

## 2017-04-15 ENCOUNTER — Other Ambulatory Visit: Payer: Self-pay | Admitting: General Surgery

## 2017-04-15 DIAGNOSIS — C7989 Secondary malignant neoplasm of other specified sites: Principal | ICD-10-CM

## 2017-04-15 DIAGNOSIS — C50911 Malignant neoplasm of unspecified site of right female breast: Secondary | ICD-10-CM

## 2017-04-15 DIAGNOSIS — L920 Granuloma annulare: Secondary | ICD-10-CM | POA: Diagnosis not present

## 2017-04-16 ENCOUNTER — Ambulatory Visit (HOSPITAL_BASED_OUTPATIENT_CLINIC_OR_DEPARTMENT_OTHER): Payer: 59 | Admitting: Hematology and Oncology

## 2017-04-16 ENCOUNTER — Encounter: Payer: Self-pay | Admitting: Hematology and Oncology

## 2017-04-16 VITALS — BP 135/76 | HR 70 | Temp 98.2°F | Resp 20 | Ht 67.0 in | Wt 191.2 lb

## 2017-04-16 DIAGNOSIS — Z170 Estrogen receptor positive status [ER+]: Secondary | ICD-10-CM | POA: Diagnosis not present

## 2017-04-16 DIAGNOSIS — Z79810 Long term (current) use of selective estrogen receptor modulators (SERMs): Secondary | ICD-10-CM | POA: Diagnosis not present

## 2017-04-16 DIAGNOSIS — Z780 Asymptomatic menopausal state: Secondary | ICD-10-CM

## 2017-04-16 DIAGNOSIS — C50411 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 female breast: Secondary | ICD-10-CM | POA: Diagnosis not present

## 2017-04-16 MED ORDER — ANASTROZOLE 1 MG PO TABS: 1.0000 mg | ORAL_TABLET | Freq: Every day | ORAL | 3 refills | Status: DC

## 2017-04-16 MED ORDER — ANASTROZOLE 1 MG PO TABS: 1.0000 mg | ORAL_TABLET | Freq: Every day | ORAL | 0 refills | Status: DC

## 2017-04-16 NOTE — Assessment & Plan Note (Signed)
Right breast invasive ductal carcinoma stage II diagnosed 05/01/2000 treated with lumpectomy and adjuvant chemotherapy CALGB 09106 study followed by 5 years of tamoxifen Recurrent right breast cancer 05/31/2009 status post mastectomy followed by adjuvant chemotherapy with Taxol and carboplatin Recurrent breast cancer with chest wall recurrence diagnosed at [facility] status post resection and axillary lymph node dissection followed by CMF chemotherapy followed by radiation  Current treatment: Antiestrogen therapy with tamoxifen started 09/30/2012 status post bilateral salpingo-oophorectomy started Femara in January 2016 could not tolerate it switched to Aromasin 02/01/2015, could not tolerate anastrozole either. Switched to Tamoxifen 20 daily 04/09/16  Relapse: 04/08/2017 Palpable lump right chest wall 1.8 cm by ultrasound: Invasive ductal carcinoma with DCIS grade 2-3 with suspicion for lymphovascular invasion. ER 80%, PR 15%, HER-2 negative ratio 1.32, Ki-67 20% Plan: 1. PET/CT scan for restaging 2. surgical excision 3. Adjuvant antiestrogen therapy with anastrozole 1 mg daily. Patient had failed tamoxifen therapy and needs aromatase inhibitor therapy. She had profound arthralgias and myalgias previously and is very concerned about it. I instructed her to take half a tablet daily for a week and then increase to full tablet daily. I do not believe there is any need for systemic chemotherapy at this time. I will also get a bone density test for baseline.  Return to clinic in 4 weeks for follow-up.

## 2017-04-16 NOTE — Progress Notes (Signed)
Patient Care Team: Patient, No Pcp Per as PCP - General (General Practice)  DIAGNOSIS:  Encounter Diagnoses  Name Primary?  . 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 breast in female, estrogen receptor positive ([place]) Yes  . Post-menopausal     SUMMARY OF ONCOLOGIC HISTORY:   Breast cancer of upper-outer quadrant of right female breast ([place])   05/01/2000 Initial Biopsy    Right breast invasive ductal carcinoma stage II treated with lumpectomy followed by chemotherapy Adriamycin and Taxotere on CALGB 21194 study followed by 5 years of tamoxifen      05/31/2009 Relapse/Recurrence    Right breast recurrent cancer: 1.6 cm with LVI Triple negative status post mastectomy followed by adjuvant chemotherapy Taxol and carboplatin completed December 2010      03/31/2012 Relapse/Recurrence    Chest wall recurrence diagnosed at [facility] ER/PR positive status post right axillary lymph node dissection for lymph nodes negative: 3.1 cm adenocarcinoma with perineural invasion, could be soft tissue met versus lymph node      06/10/2012 - 09/23/2012 Chemotherapy    CMF chemotherapy x6 cycles      07/15/2012 - 08/19/2012 Radiation Therapy    Concurrent with radiation patient received Cytoxan and 5-FU cycles 2 to 4      09/30/2012 -  Anti-estrogen oral therapy    Tamoxifen 20 mg daily switched to Aromasin and then switched to anastrozole January 2017       Surgery    Bilateral salpingo-oophorectomy      04/08/2017 Relapse/Recurrence    Palpable lump right chest wall 1.8 cm by ultrasound: Invasive ductal carcinoma with DCIS grade 2-3 with suspicion for lymphovascular invasion. ER 80%, PR 15%, HER-2 negative ratio 1.32, Ki-67 20%       CHIEF COMPLIANT: Newly diagnosed disease relapse in the right chest wall  INTERVAL HISTORY: Breanna Lara is a 46 year old with above-mentioned history of multiple relapses of breast cancer who is here with another palpable lump and she underwent  ultrasound guided biopsy which revealed a 1.8 cm invasive ductal carcinoma with DCIS grade 2-3 that was ER/PR positive and HER-2 negative. She was seen by Dr. Donne Hazel who is evaluating surgical options and she has been set up for a breast MRI that is happening tomorrow. She was sent to me for discussion regarding systemic treatment for her condition. She is here today accompanied by her husband and her mom.  REVIEW OF SYSTEMS:   Constitutional: Denies fevers, chills or abnormal weight loss Eyes: Denies blurriness of vision Ears, nose, mouth, throat, and face: Denies mucositis or sore throat Respiratory: Denies cough, dyspnea or wheezes Cardiovascular: Denies palpitation, chest discomfort Gastrointestinal:  Denies nausea, heartburn or change in bowel habits Skin: Denies abnormal skin rashes Lymphatics: Denies new lymphadenopathy or easy bruising Neurological:Denies numbness, tingling or new weaknesses Behavioral/Psych: Mood is stable, no new changes  Extremities: No lower extremity edema Breast: Lump in the right chest wall All other systems were reviewed with the patient and are negative.  I have reviewed the past medical history, past surgical history, social history and family history with the patient and they are unchanged from previous note.  ALLERGIES:  is allergic to percocet [oxycodone-acetaminophen].  MEDICATIONS:  Current Outpatient Prescriptions  Medication Sig Dispense Refill  . anastrozole (ARIMIDEX) 1 MG tablet Take 1 tablet (1 mg total) by mouth daily. 90 tablet 3  . anastrozole (ARIMIDEX) 1 MG tablet Take 1 tablet (1 mg total) by mouth daily. 30 tablet 0  . Black Pepper-Turmeric  3-500 MG CAPS Take 1 tablet by mouth daily.    . calcium carbonate (OS-CAL) 600 MG TABS Take 1 tablet (600 mg total) by mouth 2 (two) times daily with a meal. 60 tablet 12  . cholecalciferol (VITAMIN D) 1000 UNITS tablet Take 1,000 Units by mouth daily.      . fexofenadine (ALLEGRA) 30 MG tablet  Take 30 mg by mouth as needed.    . Probiotic Product (PROBIOTIC DAILY PO) Take 1 tablet by mouth daily.     No current facility-administered medications for this visit.     PHYSICAL EXAMINATION: ECOG PERFORMANCE STATUS: 1 - Symptomatic but completely ambulatory  Vitals:   04/16/17 0821  BP: 135/76  Pulse: 70  Resp: 20  Temp: 98.2 F (36.8 C)   Filed Weights   04/16/17 0821  Weight: 191 lb 3.2 oz (86.7 kg)    GENERAL:alert, no distress and comfortable SKIN: skin color, texture, turgor are normal, no rashes or significant lesions EYES: normal, Conjunctiva are pink and non-injected, sclera clear OROPHARYNX:no exudate, no erythema and lips, buccal mucosa, and tongue normal  NECK: supple, thyroid normal size, non-tender, without nodularity LYMPH:  no palpable lymphadenopathy in the cervical, axillary or inguinal LUNGS: clear to auscultation and percussion with normal breathing effort HEART: regular rate & rhythm and no murmurs and no lower extremity edema ABDOMEN:abdomen soft, non-tender and normal bowel sounds MUSCULOSKELETAL:no cyanosis of digits and no clubbing  NEURO: alert & oriented x 3 with fluent speech, no focal motor/sensory deficits EXTREMITIES: No lower extremity edema  LABORATORY DATA:  I have reviewed the data as listed   Chemistry      Component Value Date/Time   NA 142 12/19/2015 1003   K 3.9 12/19/2015 1003   CL 103 01/07/2013 1137   CO2 28 12/19/2015 1003   BUN 12.9 12/19/2015 1003   CREATININE 0.9 12/19/2015 1003      Component Value Date/Time   CALCIUM 9.3 12/19/2015 1003   ALKPHOS 67 12/19/2015 1003   AST 22 12/19/2015 1003   ALT 26 12/19/2015 1003   BILITOT 0.50 12/19/2015 1003       Lab Results  Component Value Date   WBC 7.2 12/19/2015   HGB 12.1 12/19/2015   HCT 37.2 12/19/2015   MCV 87.1 12/19/2015   PLT 267 12/19/2015   NEUTROABS 4.5 12/19/2015    ASSESSMENT & PLAN:  Breast cancer of upper-outer quadrant of right female breast  ([place]) Right breast invasive ductal carcinoma stage II diagnosed 05/01/2000 treated with lumpectomy and adjuvant chemotherapy CALGB 62947 study followed by 5 years of tamoxifen Recurrent right breast cancer 05/31/2009 status post mastectomy followed by adjuvant chemotherapy with Taxol and carboplatin Recurrent breast cancer with chest wall recurrence diagnosed at [facility] status post resection and axillary lymph node dissection followed by CMF chemotherapy followed by radiation  Current treatment: Antiestrogen therapy with tamoxifen started 09/30/2012 status post bilateral salpingo-oophorectomy started Femara in January 2016 could not tolerate it switched to Aromasin 02/01/2015, could not tolerate anastrozole either. Switched to Tamoxifen 20 daily 04/09/16  Relapse: 04/08/2017 Palpable lump right chest wall 1.8 cm by ultrasound: Invasive ductal carcinoma with DCIS grade 2-3 with suspicion for lymphovascular invasion. ER 80%, PR 15%, HER-2 negative ratio 1.32, Ki-67 20% Plan: 1. PET/CT scan for restaging 2. surgical excision 3. Adjuvant antiestrogen therapy with anastrozole 1 mg daily. Patient had failed tamoxifen therapy and needs aromatase inhibitor therapy. She had profound arthralgias and myalgias previously and is very concerned about it. I  instructed her to take half a tablet daily for a week and then increase to full tablet daily. I do not believe there is any need for systemic chemotherapy at this time. I will also get a bone density test for baseline.  Return to clinic in 4 weeks for follow-up.   I spent 25 minutes talking to the patient of which more than half was spent in counseling and coordination of care.  Orders Placed This Encounter  Procedures  . NM PET Image Restag (PS) Skull Base To Thigh    Standing Status:   Future    Standing Expiration Date:   04/16/2018    Order Specific Question:   Reason for Exam (SYMPTOM  OR DIAGNOSIS REQUIRED)    Answer:   relapsed breast  cancer restaging    Order Specific Question:   If indicated for the ordered procedure, I authorize the administration of a radiopharmaceutical per Radiology protocol    Answer:   Yes    Order Specific Question:   Is the patient pregnant?    Answer:   No    Order Specific Question:   Preferred imaging location?    Answer:   [facility]    Order Specific Question:   Radiology Contrast Protocol - do NOT remove file path    Answer:   _[email or web address]  . DG Bone Density    [employer] PF: 07/15/2013 BCG EPIC ORDER JRT/VONTE [facility] MED ONCOLOGY    Standing Status:   Future    Standing Expiration Date:   04/16/2018    Order Specific Question:   Reason for Exam (SYMPTOM  OR DIAGNOSIS REQUIRED)    Answer:   Post menopausal patient on aromatase inh    Order Specific Question:   Is the patient pregnant?    Answer:   No    Order Specific Question:   Preferred imaging location?    Answer:   [facility]   The patient has a good understanding of the overall plan. she agrees with it. she will call with any problems that may develop before the next visit here.   Rulon Eisenmenger, MD 04/16/17

## 2017-04-17 ENCOUNTER — Ambulatory Visit
Admission: RE | Admit: 2017-04-17 | Discharge: 2017-04-17 | Disposition: A | Discharge status: Home / Self Care | Payer: 59 | Source: Ambulatory Visit | Attending: General Surgery | Admitting: General Surgery

## 2017-04-17 DIAGNOSIS — C50911 Malignant neoplasm of unspecified site of right female breast: Secondary | ICD-10-CM

## 2017-04-17 DIAGNOSIS — C7989 Secondary malignant neoplasm of other specified sites: Principal | ICD-10-CM

## 2017-04-17 DIAGNOSIS — R928 Other abnormal and inconclusive findings on diagnostic imaging of breast: Secondary | ICD-10-CM | POA: Diagnosis not present

## 2017-04-17 MED ORDER — GADOBENATE DIMEGLUMINE 529 MG/ML IV SOLN: 18.0000 mL | Freq: Once | INTRAVENOUS | Status: DC | PRN

## 2017-04-22 ENCOUNTER — Ambulatory Visit
Admission: RE | Admit: 2017-04-22 | Discharge: 2017-04-22 | Disposition: A | Discharge status: Home / Self Care | Payer: 59 | Source: Ambulatory Visit | Attending: Hematology and Oncology | Admitting: Hematology and Oncology

## 2017-04-22 DIAGNOSIS — Z170 Estrogen receptor positive status [ER+]: Principal | ICD-10-CM

## 2017-04-22 DIAGNOSIS — Z780 Asymptomatic menopausal state: Secondary | ICD-10-CM

## 2017-04-22 DIAGNOSIS — C50411 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 female breast: Secondary | ICD-10-CM

## 2017-04-24 ENCOUNTER — Encounter (HOSPITAL_COMMUNITY)
Admission: RE | Admit: 2017-04-24 | Discharge: 2017-04-24 | Disposition: A | Discharge status: Home / Self Care | Payer: 59 | Source: Ambulatory Visit | Attending: Hematology and Oncology | Admitting: Hematology and Oncology

## 2017-04-24 DIAGNOSIS — Z170 Estrogen receptor positive status [ER+]: Secondary | ICD-10-CM | POA: Insufficient documentation

## 2017-04-24 DIAGNOSIS — C50411 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 female breast: Secondary | ICD-10-CM | POA: Diagnosis not present

## 2017-04-24 DIAGNOSIS — R911 Solitary pulmonary nodule: Secondary | ICD-10-CM | POA: Diagnosis not present

## 2017-04-24 DIAGNOSIS — C762 Malignant neoplasm of abdomen: Secondary | ICD-10-CM | POA: Diagnosis not present

## 2017-04-24 LAB — GLUCOSE, CAPILLARY: Glucose-Capillary: 98 mg/dL (ref 65–99)

## 2017-04-24 MED ORDER — SODIUM FLUORIDE F-18
9.6600 | Freq: Once | INTRAVENOUS | Status: AC
  Administered 2017-04-24: 9.66 via INTRAVENOUS

## 2017-05-05 ENCOUNTER — Other Ambulatory Visit: Payer: Self-pay | Admitting: General Surgery

## 2017-05-05 DIAGNOSIS — C7989 Secondary malignant neoplasm of other specified sites: Principal | ICD-10-CM

## 2017-05-05 DIAGNOSIS — C50911 Malignant neoplasm of unspecified site of right female breast: Secondary | ICD-10-CM | POA: Diagnosis not present

## 2017-05-06 ENCOUNTER — Other Ambulatory Visit: Payer: Self-pay | Admitting: General Surgery

## 2017-05-06 DIAGNOSIS — C7989 Secondary malignant neoplasm of other specified sites: Principal | ICD-10-CM

## 2017-05-06 DIAGNOSIS — C50911 Malignant neoplasm of unspecified site of right female breast: Secondary | ICD-10-CM

## 2017-05-08 ENCOUNTER — Encounter (HOSPITAL_BASED_OUTPATIENT_CLINIC_OR_DEPARTMENT_OTHER): Payer: Self-pay | Admitting: *Deleted

## 2017-05-11 ENCOUNTER — Ambulatory Visit
Admission: RE | Admit: 2017-05-11 | Discharge: 2017-05-11 | Disposition: A | Discharge status: Home / Self Care | Payer: 59 | Source: Ambulatory Visit | Attending: General Surgery | Admitting: General Surgery

## 2017-05-11 ENCOUNTER — Other Ambulatory Visit: Payer: Self-pay | Admitting: General Surgery

## 2017-05-11 ENCOUNTER — Encounter: Payer: Self-pay | Admitting: *Deleted

## 2017-05-11 ENCOUNTER — Ambulatory Visit (HOSPITAL_BASED_OUTPATIENT_CLINIC_OR_DEPARTMENT_OTHER): Payer: 59 | Admitting: Hematology and Oncology

## 2017-05-11 ENCOUNTER — Encounter: Payer: Self-pay | Admitting: Hematology and Oncology

## 2017-05-11 ENCOUNTER — Other Ambulatory Visit: Payer: Self-pay

## 2017-05-11 DIAGNOSIS — M899 Disorder of bone, unspecified: Secondary | ICD-10-CM

## 2017-05-11 DIAGNOSIS — C50911 Malignant neoplasm of unspecified site of right female breast: Secondary | ICD-10-CM

## 2017-05-11 DIAGNOSIS — C7989 Secondary malignant neoplasm of other specified sites: Principal | ICD-10-CM

## 2017-05-11 DIAGNOSIS — N6310 Unspecified lump in the right breast, unspecified quadrant: Secondary | ICD-10-CM | POA: Diagnosis not present

## 2017-05-11 DIAGNOSIS — Z170 Estrogen receptor positive status [ER+]: Secondary | ICD-10-CM

## 2017-05-11 DIAGNOSIS — C50411 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 female breast: Secondary | ICD-10-CM

## 2017-05-11 DIAGNOSIS — Z79810 Long term (current) use of selective estrogen receptor modulators (SERMs): Secondary | ICD-10-CM

## 2017-05-11 NOTE — Progress Notes (Signed)
Referral made for Evern Bio at [facility] per Dr.Gudena's request. Sent in basket msg to Charlie Pitter to notify her of referral.

## 2017-05-11 NOTE — Progress Notes (Signed)
Pt given boost with instructions for completion by 0400 hrs DOS. NPO otherwise. Pt. Verbalized understanding.

## 2017-05-11 NOTE — Assessment & Plan Note (Signed)
Right breast invasive ductal carcinoma stage II diagnosed 05/01/2000 treated with lumpectomy and adjuvant chemotherapy CALGB 23361 study followed by 5 years of tamoxifen Recurrent right breast cancer 05/31/2009 status post mastectomy followed by adjuvant chemotherapy with Taxol and carboplatin Recurrent breast cancer with chest wall recurrence diagnosed at [facility] status post resection and axillary lymph node dissection followed by CMF chemotherapy followed by radiation  Current treatment: Antiestrogen therapy with tamoxifen started 09/30/2012 status post bilateral salpingo-oophorectomy started Femara in January 2016 could not tolerate it switched to Aromasin 02/01/2015, could not tolerate anastrozole either. Switched to Tamoxifen 20 daily 04/09/16  Relapse: 04/08/2017 Palpable lump right chest wall 1.8 cm by ultrasound: Invasive ductal carcinoma with DCIS grade 2-3 with suspicion for lymphovascular invasion. ER 80%, PR 15%, HER-2 negative ratio 1.32, Ki-67 20% ----------------------------------------------------------------- PET/CT scan 04/24/2017 Small right chest wall nodule is hypermetabolic and suggestive of recurrent chest wall tumor, hypermetabolic lytic right acetabular lesion concerning for skeletal metastases.  Recommendation: 1. Surgical excision of the chest wall mass 2. consider biopsy of the bone lesion 3. radiation to the hypermetabolic right acetabulum lesion  4. Anastrozole 1 mg daily if she tolerates (previous oophorectomy)

## 2017-05-11 NOTE — Progress Notes (Signed)
Patient Care Team: Patient, No Pcp Per as PCP - General (General Practice)  DIAGNOSIS:  Encounter Diagnosis  Name Primary?  . 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 breast in female, estrogen receptor positive ([place])     SUMMARY OF ONCOLOGIC HISTORY:   Breast cancer of upper-outer quadrant of right female breast ([place])   05/01/2000 Initial Biopsy    Right breast invasive ductal carcinoma stage II treated with lumpectomy followed by chemotherapy Adriamycin and Taxotere on CALGB 65993 study followed by 5 years of tamoxifen      05/31/2009 Relapse/Recurrence    Right breast recurrent cancer: 1.6 cm with LVI Triple negative status post mastectomy followed by adjuvant chemotherapy Taxol and carboplatin completed December 2010      03/31/2012 Relapse/Recurrence    Chest wall recurrence diagnosed at [facility] ER/PR positive status post right axillary lymph node dissection for lymph nodes negative: 3.1 cm adenocarcinoma with perineural invasion, could be soft tissue met versus lymph node      06/10/2012 - 09/23/2012 Chemotherapy    CMF chemotherapy x6 cycles      07/15/2012 - 08/19/2012 Radiation Therapy    Concurrent with radiation patient received Cytoxan and 5-FU cycles 2 to 4      09/30/2012 -  Anti-estrogen oral therapy    Tamoxifen 20 mg daily switched to Aromasin and then switched to anastrozole January 2017       Surgery    Bilateral salpingo-oophorectomy      04/08/2017 Relapse/Recurrence    Palpable lump right chest wall 1.8 cm by ultrasound: Invasive ductal carcinoma with DCIS grade 2-3 with suspicion for lymphovascular invasion. ER 80%, PR 15%, HER-2 negative ratio 1.32, Ki-67 20%      04/24/2017 PET scan    Small right chest wall nodule is hypermetabolic and suggestive of recurrent chest wall tumor, hypermetabolic lytic right acetabular lesion concerning for skeletal metastases        CHIEF COMPLIANT: Follow-up to review the recently performed PET/CT  scan  INTERVAL HISTORY: Breanna Lara is a 46 year old with above-mentioned history of recurrent breast cancer who had a chest wall recurrence and as part of the workup he performed a PET/CT scan. She has seen Dr. Donne Hazel who is planning to resect this tumor. The PET/CT scan suggested a right acetabular lesion that is hypermetabolic. No other bones were involved. She is here today to discuss the significance of this lesion. She denies any pain or discomfort in the right hip.  REVIEW OF SYSTEMS:   Constitutional: Denies fevers, chills or abnormal weight loss Eyes: Denies blurriness of vision Ears, nose, mouth, throat, and face: Denies mucositis or sore throat Respiratory: Denies cough, dyspnea or wheezes Cardiovascular: Denies palpitation, chest discomfort Gastrointestinal:  Denies nausea, heartburn or change in bowel habits Skin: Denies abnormal skin rashes Lymphatics: Denies new lymphadenopathy or easy bruising Neurological:Denies numbness, tingling or new weaknesses Behavioral/Psych: Mood is stable, no new changes  Extremities: No lower extremity edema Breast:  Right chest wall lump All other systems were reviewed with the patient and are negative.  I have reviewed the past medical history, past surgical history, social history and family history with the patient and they are unchanged from previous note.  ALLERGIES:  is allergic to percocet [oxycodone-acetaminophen].  MEDICATIONS:  Current Outpatient Prescriptions  Medication Sig Dispense Refill  . anastrozole (ARIMIDEX) 1 MG tablet Take 1 tablet (1 mg total) by mouth daily. 30 tablet 0  . Black Pepper-Turmeric 3-500 MG CAPS Take 1 tablet by  mouth daily.    . calcium carbonate (OS-CAL) 600 MG TABS Take 1 tablet (600 mg total) by mouth 2 (two) times daily with a meal. 60 tablet 12  . cholecalciferol (VITAMIN D) 1000 UNITS tablet Take 1,000 Units by mouth daily.      . fexofenadine (ALLEGRA) 30 MG tablet Take 30 mg by mouth as  needed.    . Misc Natural Products (OSTEO BI-FLEX TRIPLE STRENGTH PO) Take by mouth.    . Probiotic Product (PROBIOTIC DAILY PO) Take 1 tablet by mouth daily.    . Turmeric Curcumin 500 MG CAPS Take by mouth.     No current facility-administered medications for this visit.     PHYSICAL EXAMINATION: ECOG PERFORMANCE STATUS: 1 - Symptomatic but completely ambulatory  Vitals:   05/11/17 1013  BP: 125/75  Pulse: 76  Resp: 18  Temp: 98.5 F (36.9 C)   Filed Weights   05/11/17 1013  Weight: 189 lb 6.4 oz (85.9 kg)    GENERAL:alert, no distress and comfortable SKIN: skin color, texture, turgor are normal, no rashes or significant lesions EYES: normal, Conjunctiva are pink and non-injected, sclera clear OROPHARYNX:no exudate, no erythema and lips, buccal mucosa, and tongue normal  NECK: supple, thyroid normal size, non-tender, without nodularity LYMPH:  no palpable lymphadenopathy in the cervical, axillary or inguinal LUNGS: clear to auscultation and percussion with normal breathing effort HEART: regular rate & rhythm and no murmurs and no lower extremity edema ABDOMEN:abdomen soft, non-tender and normal bowel sounds MUSCULOSKELETAL:no cyanosis of digits and no clubbing  NEURO: alert & oriented x 3 with fluent speech, no focal motor/sensory deficits EXTREMITIES: No lower extremity edema   LABORATORY DATA:  I have reviewed the data as listed   Chemistry      Component Value Date/Time   NA 142 12/19/2015 1003   K 3.9 12/19/2015 1003   CL 103 01/07/2013 1137   CO2 28 12/19/2015 1003   BUN 12.9 12/19/2015 1003   CREATININE 0.9 12/19/2015 1003      Component Value Date/Time   CALCIUM 9.3 12/19/2015 1003   ALKPHOS 67 12/19/2015 1003   AST 22 12/19/2015 1003   ALT 26 12/19/2015 1003   BILITOT 0.50 12/19/2015 1003       Lab Results  Component Value Date   WBC 7.2 12/19/2015   HGB 12.1 12/19/2015   HCT 37.2 12/19/2015   MCV 87.1 12/19/2015   PLT 267 12/19/2015    NEUTROABS 4.5 12/19/2015    ASSESSMENT & PLAN:  Breast cancer of upper-outer quadrant of right female breast ([place]) Right breast invasive ductal carcinoma stage II diagnosed 05/01/2000 treated with lumpectomy and adjuvant chemotherapy CALGB 16109 study followed by 5 years of tamoxifen Recurrent right breast cancer 05/31/2009 status post mastectomy followed by adjuvant chemotherapy with Taxol and carboplatin Recurrent breast cancer with chest wall recurrence diagnosed at [facility] status post resection and axillary lymph node dissection followed by CMF chemotherapy followed by radiation  Current treatment: Antiestrogen therapy with tamoxifen started 09/30/2012 status post bilateral salpingo-oophorectomy started Femara in January 2016 could not tolerate it switched to Aromasin 02/01/2015, could not tolerate anastrozole either. Switched to Tamoxifen 20 daily 04/09/16  Relapse: 04/08/2017 Palpable lump right chest wall 1.8 cm by ultrasound: Invasive ductal carcinoma with DCIS grade 2-3 with suspicion for lymphovascular invasion. ER 80%, PR 15%, HER-2 negative ratio 1.32, Ki-67 20% ----------------------------------------------------------------- PET/CT scan 04/24/2017 Small right chest wall nodule is hypermetabolic and suggestive of recurrent chest wall tumor, hypermetabolic lytic right  acetabular lesion concerning for skeletal metastases.  Recommendation: 1. Surgical excision of the chest wall mass 2. consider biopsy of the bone lesion 3. radiation to the hypermetabolic right acetabulum lesion  4. Anastrozole 1 mg daily (previous oophorectomy)  We discussed the pros and cons of adding Palbociclib to her treatment plan. Palbociclib counseling: I discussed at length the risks and benefits of Palbociclib in combination with letrozole. Adverse effects of Palbociclib include decreasing neutrophil count, pneumonia, blood clots in lungs as well as nausea and GI symptoms. Side effects of letrozole  include hot flashes, muscle aches and pains, uterine bleeding/spotting/cancer, osteoporosis, risk of blood clots.  Patient would like to see Dr. Rolene Course at [facility] and get another opinion. She will call us when she is ready to pick her radiation oncologist. I will see her back depending upon the recommendations from Dr. Rolene Course  I spent 25 minutes talking to the patient of which more than half was spent in counseling and coordination of care.  No orders of the defined types were placed in this encounter.  The patient has a good understanding of the overall plan. she agrees with it. she will call with any problems that may develop before the next visit here.   Rulon Eisenmenger, MD 05/11/17

## 2017-05-12 ENCOUNTER — Other Ambulatory Visit: Payer: Self-pay | Admitting: Hematology and Oncology

## 2017-05-12 ENCOUNTER — Telehealth: Payer: Self-pay | Admitting: Hematology and Oncology

## 2017-05-12 DIAGNOSIS — C50411 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 female breast: Secondary | ICD-10-CM

## 2017-05-12 DIAGNOSIS — Z780 Asymptomatic menopausal state: Secondary | ICD-10-CM

## 2017-05-12 DIAGNOSIS — Z170 Estrogen receptor positive status [ER+]: Principal | ICD-10-CM

## 2017-05-12 NOTE — Telephone Encounter (Signed)
Pt appt with Dr.Carey is 05/28/17@9 :00. Pt is aware

## 2017-05-13 ENCOUNTER — Ambulatory Visit: Payer: 59 | Admitting: Hematology and Oncology

## 2017-05-15 ENCOUNTER — Encounter (HOSPITAL_BASED_OUTPATIENT_CLINIC_OR_DEPARTMENT_OTHER)
Admission: RE | Disposition: A | Discharge status: Home / Self Care | Payer: Self-pay | Source: Ambulatory Visit | Attending: General Surgery

## 2017-05-15 ENCOUNTER — Encounter (HOSPITAL_BASED_OUTPATIENT_CLINIC_OR_DEPARTMENT_OTHER): Payer: Self-pay

## 2017-05-15 ENCOUNTER — Ambulatory Visit (HOSPITAL_BASED_OUTPATIENT_CLINIC_OR_DEPARTMENT_OTHER): Payer: 59 | Admitting: Anesthesiology

## 2017-05-15 ENCOUNTER — Ambulatory Visit (HOSPITAL_BASED_OUTPATIENT_CLINIC_OR_DEPARTMENT_OTHER)
Admission: RE | Admit: 2017-05-15 | Discharge: 2017-05-15 | Disposition: A | Discharge status: Home / Self Care | Payer: 59 | Source: Ambulatory Visit | Attending: General Surgery | Admitting: General Surgery

## 2017-05-15 ENCOUNTER — Ambulatory Visit
Admission: RE | Admit: 2017-05-15 | Discharge: 2017-05-15 | Disposition: A | Discharge status: Home / Self Care | Payer: 59 | Source: Ambulatory Visit | Attending: General Surgery | Admitting: General Surgery

## 2017-05-15 DIAGNOSIS — Z885 Allergy status to narcotic agent status: Secondary | ICD-10-CM | POA: Insufficient documentation

## 2017-05-15 DIAGNOSIS — C50411 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 female breast: Secondary | ICD-10-CM | POA: Diagnosis not present

## 2017-05-15 DIAGNOSIS — Z9011 Acquired absence of right breast and nipple: Secondary | ICD-10-CM | POA: Insufficient documentation

## 2017-05-15 DIAGNOSIS — R928 Other abnormal and inconclusive findings on diagnostic imaging of breast: Secondary | ICD-10-CM | POA: Diagnosis not present

## 2017-05-15 DIAGNOSIS — Z79899 Other long term (current) drug therapy: Secondary | ICD-10-CM | POA: Diagnosis not present

## 2017-05-15 DIAGNOSIS — Z833 Family history of diabetes mellitus: Secondary | ICD-10-CM | POA: Diagnosis not present

## 2017-05-15 DIAGNOSIS — Z8249 Family history of ischemic heart disease and other diseases of the circulatory system: Secondary | ICD-10-CM | POA: Insufficient documentation

## 2017-05-15 DIAGNOSIS — Z801 Family history of malignant neoplasm of trachea, bronchus and lung: Secondary | ICD-10-CM | POA: Insufficient documentation

## 2017-05-15 DIAGNOSIS — F419 Anxiety disorder, unspecified: Secondary | ICD-10-CM | POA: Diagnosis not present

## 2017-05-15 DIAGNOSIS — C7989 Secondary malignant neoplasm of other specified sites: Principal | ICD-10-CM

## 2017-05-15 DIAGNOSIS — C50911 Malignant neoplasm of unspecified site of right female breast: Secondary | ICD-10-CM

## 2017-05-15 DIAGNOSIS — A6000 Herpesviral infection of urogenital system, unspecified: Secondary | ICD-10-CM | POA: Diagnosis not present

## 2017-05-15 DIAGNOSIS — Z803 Family history of malignant neoplasm of breast: Secondary | ICD-10-CM | POA: Insufficient documentation

## 2017-05-15 DIAGNOSIS — K589 Irritable bowel syndrome without diarrhea: Secondary | ICD-10-CM | POA: Diagnosis not present

## 2017-05-15 DIAGNOSIS — Z923 Personal history of irradiation: Secondary | ICD-10-CM | POA: Insufficient documentation

## 2017-05-15 DIAGNOSIS — Z9221 Personal history of antineoplastic chemotherapy: Secondary | ICD-10-CM | POA: Diagnosis not present

## 2017-05-15 DIAGNOSIS — Z800 Family history of malignant neoplasm of digestive organs: Secondary | ICD-10-CM | POA: Diagnosis not present

## 2017-05-15 HISTORY — PX: RADIOACTIVE SEED GUIDED EXCISIONAL BREAST BIOPSY: SHX6490

## 2017-05-15 SURGERY — RADIOACTIVE SEED GUIDED BREAST BIOPSY
Anesthesia: General | Site: Chest | Laterality: Right

## 2017-05-15 MED ORDER — GABAPENTIN 300 MG PO CAPS
300.0000 mg | ORAL_CAPSULE | ORAL | Status: AC
  Administered 2017-05-15: 300 mg via ORAL

## 2017-05-15 MED ORDER — MIDAZOLAM HCL 2 MG/2ML IJ SOLN: 0.5000 mg | Freq: Once | INTRAMUSCULAR | Status: DC | PRN

## 2017-05-15 MED ORDER — PROPOFOL 10 MG/ML IV BOLUS
INTRAVENOUS | Status: DC | PRN
  Administered 2017-05-15: 200 mg via INTRAVENOUS

## 2017-05-15 MED ORDER — HYDROCODONE-ACETAMINOPHEN 5-325 MG PO TABS: 1.0000 | ORAL_TABLET | ORAL | 0 refills | Status: DC | PRN

## 2017-05-15 MED ORDER — SCOPOLAMINE 1 MG/3DAYS TD PT72: 1.0000 | MEDICATED_PATCH | Freq: Once | TRANSDERMAL | Status: DC | PRN

## 2017-05-15 MED ORDER — DEXAMETHASONE SODIUM PHOSPHATE 10 MG/ML IJ SOLN
INTRAMUSCULAR | Status: AC
  Filled 2017-05-15: qty 1

## 2017-05-15 MED ORDER — CEFAZOLIN SODIUM-DEXTROSE 2-4 GM/100ML-% IV SOLN
2.0000 g | INTRAVENOUS | Status: AC
  Administered 2017-05-15: 2 g via INTRAVENOUS

## 2017-05-15 MED ORDER — PROPOFOL 500 MG/50ML IV EMUL
INTRAVENOUS | Status: AC
  Filled 2017-05-15: qty 50

## 2017-05-15 MED ORDER — FENTANYL CITRATE (PF) 100 MCG/2ML IJ SOLN
INTRAMUSCULAR | Status: AC
  Filled 2017-05-15: qty 2

## 2017-05-15 MED ORDER — CEFAZOLIN SODIUM-DEXTROSE 2-4 GM/100ML-% IV SOLN
INTRAVENOUS | Status: AC
  Filled 2017-05-15: qty 100

## 2017-05-15 MED ORDER — HYDROMORPHONE HCL 1 MG/ML IJ SOLN: 0.2500 mg | INTRAMUSCULAR | Status: DC | PRN

## 2017-05-15 MED ORDER — DEXAMETHASONE SODIUM PHOSPHATE 4 MG/ML IJ SOLN
INTRAMUSCULAR | Status: DC | PRN
  Administered 2017-05-15: 10 mg via INTRAVENOUS

## 2017-05-15 MED ORDER — PROMETHAZINE HCL 25 MG/ML IJ SOLN: 6.2500 mg | INTRAMUSCULAR | Status: DC | PRN

## 2017-05-15 MED ORDER — GABAPENTIN 300 MG PO CAPS
ORAL_CAPSULE | ORAL | Status: AC
Start: 2017-05-15 — End: 2017-05-15
  Filled 2017-05-15: qty 1

## 2017-05-15 MED ORDER — 0.9 % SODIUM CHLORIDE (POUR BTL) OPTIME
TOPICAL | Status: DC | PRN
  Administered 2017-05-15: 1000 mL

## 2017-05-15 MED ORDER — BUPIVACAINE-EPINEPHRINE 0.25% -1:200000 IJ SOLN
INTRAMUSCULAR | Status: DC | PRN
  Administered 2017-05-15: 10 mL

## 2017-05-15 MED ORDER — BUPIVACAINE-EPINEPHRINE (PF) 0.25% -1:200000 IJ SOLN
INTRAMUSCULAR | Status: AC
  Filled 2017-05-15: qty 60

## 2017-05-15 MED ORDER — FENTANYL CITRATE (PF) 100 MCG/2ML IJ SOLN: 50.0000 ug | INTRAMUSCULAR | Status: DC | PRN

## 2017-05-15 MED ORDER — LACTATED RINGERS IV SOLN
INTRAVENOUS | Status: DC
  Administered 2017-05-15: 07:00:00 via INTRAVENOUS

## 2017-05-15 MED ORDER — MIDAZOLAM HCL 2 MG/2ML IJ SOLN: 1.0000 mg | INTRAMUSCULAR | Status: DC | PRN

## 2017-05-15 MED ORDER — ONDANSETRON HCL 4 MG/2ML IJ SOLN
INTRAMUSCULAR | Status: AC
  Filled 2017-05-15: qty 2

## 2017-05-15 MED ORDER — ONDANSETRON HCL 4 MG/2ML IJ SOLN
INTRAMUSCULAR | Status: DC | PRN
  Administered 2017-05-15: 4 mg via INTRAVENOUS

## 2017-05-15 MED ORDER — MEPERIDINE HCL 25 MG/ML IJ SOLN: 6.2500 mg | INTRAMUSCULAR | Status: DC | PRN

## 2017-05-15 MED ORDER — LIDOCAINE 2% (20 MG/ML) 5 ML SYRINGE
INTRAMUSCULAR | Status: AC
  Filled 2017-05-15: qty 5

## 2017-05-15 MED ORDER — FENTANYL CITRATE (PF) 100 MCG/2ML IJ SOLN
INTRAMUSCULAR | Status: DC | PRN
  Administered 2017-05-15: 100 ug via INTRAVENOUS

## 2017-05-15 MED ORDER — MIDAZOLAM HCL 2 MG/2ML IJ SOLN
INTRAMUSCULAR | Status: AC
  Filled 2017-05-15: qty 2

## 2017-05-15 MED ORDER — LIDOCAINE HCL (CARDIAC) 20 MG/ML IV SOLN
INTRAVENOUS | Status: DC | PRN
  Administered 2017-05-15: 30 mg via INTRAVENOUS

## 2017-05-15 MED ORDER — MIDAZOLAM HCL 5 MG/5ML IJ SOLN
INTRAMUSCULAR | Status: DC | PRN
  Administered 2017-05-15: 2 mg via INTRAVENOUS

## 2017-05-15 SURGICAL SUPPLY — 38 items
ADH SKN CLS APL DERMABOND .7 (GAUZE/BANDAGES/DRESSINGS) ×1
BLADE SURG 15 STRL LF DISP TIS (BLADE) ×1 IMPLANT
BLADE SURG 15 STRL SS (BLADE) ×3
CHLORAPREP W/TINT 26ML (MISCELLANEOUS) ×3 IMPLANT
COVER BACK TABLE 60X90IN (DRAPES) ×3 IMPLANT
COVER MAYO STAND STRL (DRAPES) ×3 IMPLANT
COVER PROBE W GEL 5X96 (DRAPES) ×3 IMPLANT
DERMABOND ADVANCED (GAUZE/BANDAGES/DRESSINGS) ×2
DERMABOND ADVANCED .7 DNX12 (GAUZE/BANDAGES/DRESSINGS) ×1 IMPLANT
DEVICE DUBIN W/COMP PLATE 8390 (MISCELLANEOUS) ×3 IMPLANT
DRAPE LAPAROSCOPIC ABDOMINAL (DRAPES) ×3 IMPLANT
DRAPE UTILITY XL STRL (DRAPES) ×3 IMPLANT
ELECT COATED BLADE 2.86 ST (ELECTRODE) ×3 IMPLANT
ELECT REM PT RETURN 9FT ADLT (ELECTROSURGICAL) ×3
ELECTRODE REM PT RTRN 9FT ADLT (ELECTROSURGICAL) ×1 IMPLANT
GAUZE SPONGE 4X4 12PLY STRL LF (GAUZE/BANDAGES/DRESSINGS) ×2 IMPLANT
GLOVE BIO SURGEON STRL SZ7 (GLOVE) ×6 IMPLANT
GLOVE BIOGEL PI IND STRL 7.5 (GLOVE) ×1 IMPLANT
GLOVE BIOGEL PI INDICATOR 7.5 (GLOVE) ×2
GOWN STRL REUS W/ TWL LRG LVL3 (GOWN DISPOSABLE) ×2 IMPLANT
GOWN STRL REUS W/TWL LRG LVL3 (GOWN DISPOSABLE) ×6
KIT MARKER MARGIN INK (KITS) ×3 IMPLANT
NDL HYPO 25X1 1.5 SAFETY (NEEDLE) ×1 IMPLANT
NEEDLE HYPO 25X1 1.5 SAFETY (NEEDLE) ×3 IMPLANT
NS IRRIG 1000ML POUR BTL (IV SOLUTION) ×2 IMPLANT
PACK BASIN DAY SURGERY FS (CUSTOM PROCEDURE TRAY) ×3 IMPLANT
PENCIL BUTTON HOLSTER BLD 10FT (ELECTRODE) ×3 IMPLANT
SLEEVE SCD COMPRESS KNEE MED (MISCELLANEOUS) ×3 IMPLANT
SPONGE LAP 4X18 X RAY DECT (DISPOSABLE) ×3 IMPLANT
SUT ETHILON 2 0 FS 18 (SUTURE) ×6 IMPLANT
SUT VIC AB 2-0 SH 27 (SUTURE) ×9
SUT VIC AB 2-0 SH 27XBRD (SUTURE) ×1 IMPLANT
SUT VIC AB 3-0 SH 27 (SUTURE) ×3
SUT VIC AB 3-0 SH 27X BRD (SUTURE) ×1 IMPLANT
SYR CONTROL 10ML LL (SYRINGE) ×3 IMPLANT
TAPE CLOTH SURG 6X10 WHT LF (GAUZE/BANDAGES/DRESSINGS) ×2 IMPLANT
TOWEL OR 17X24 6PK STRL BLUE (TOWEL DISPOSABLE) ×3 IMPLANT
TOWEL OR NON WOVEN STRL DISP B (DISPOSABLE) ×3 IMPLANT

## 2017-05-15 NOTE — Discharge Instructions (Signed)
[place] Office Phone Number [phone]  POST OP INSTRUCTIONS  Always review your discharge instruction sheet given to you by the facility where your surgery was performed.  IF YOU HAVE DISABILITY OR FAMILY LEAVE FORMS, YOU MUST BRING THEM TO THE OFFICE FOR PROCESSING.  DO NOT GIVE THEM TO YOUR DOCTOR.  1. A prescription for pain medication may be given to you upon discharge.  Take your pain medication as prescribed, if needed.  If narcotic pain medicine is not needed, then you may take acetaminophen (Tylenol), naprosyn (Alleve) or ibuprofen (Advil) as needed. 2. Take your usually prescribed medications unless otherwise directed 3. If you need a refill on your pain medication, please contact your pharmacy.  They will contact our office to request authorization.  Prescriptions will not be filled after 5pm or on week-ends. 4. You should eat very light the first 24 hours after surgery, such as soup, crackers, pudding, etc.  Resume your normal diet the day after surgery. 5. Most patients will experience some swelling and bruising.  Ice packs will help. Keep covered when not in shower.  6. It is common to experience some constipation if taking pain medication after surgery.  Increasing fluid intake and taking a stool softener will usually help or prevent this problem from occurring.  A mild laxative (Milk of Magnesia or Miralax) should be taken according to package directions if there are no bowel movements after 48 hours. 7. Unless discharge instructions indicate otherwise, you may remove your bandages 48 hours after surgery and you may shower at that time.  You may have steri-strips (small skin tapes) in place directly over the incision.  These strips should be left on the skin for 7-10 days and will come off on their own.  Any sutures or staples will be removed at the office during your follow-up visit. 8. ACTIVITIES:  You may resume regular daily activities (gradually increasing)  beginning the next day.  Wearing a good support bra or sports bra minimizes pain and swelling.  You may have sexual intercourse when it is comfortable. a. You may drive when you no longer are taking prescription pain medication, you can comfortably wear a seatbelt, and you can safely maneuver your car and apply brakes. b. RETURN TO WORK:  ______________________________________________________________________________________ 9. You should see your doctor in the office for a follow-up appointment approximately two weeks after your surgery.  Your doctors nurse will typically make your follow-up appointment when she calls you with your pathology report.  Expect your pathology report 3-4 business days after your surgery.  You may call to check if you do not hear from [place] after three days. 10. OTHER INSTRUCTIONS: _______________________________________________________________________________________________ _____________________________________________________________________________________________________________________________________ _____________________________________________________________________________________________________________________________________ _____________________________________________________________________________________________________________________________________  WHEN TO CALL DR WAKEFIELD: 1. Fever over 101.0 2. Nausea and/or vomiting. 3. Extreme swelling or bruising. 4. Continued bleeding from incision. 5. Increased pain, redness, or drainage from the incision.  The clinic staff is available to answer your questions during regular business hours.  Please dont hesitate to call and ask to speak to one of the nurses for clinical concerns.  If you have a medical emergency, go to the nearest emergency room or call 911.  A surgeon from [facility] is always on call at the hospital.  For further questions, please visit [email or web address]  mcw    Post Anesthesia Home Care Instructions  Activity: Get plenty of rest for the remainder of the day. A responsible individual must stay with you for 24 hours following the procedure.  For the next 24 hours, DO NOT: -Drive a car -Paediatric nurse -Drink alcoholic beverages -Take any medication unless instructed by your physician -Make any legal decisions or sign important papers.  Meals: Start with liquid foods such as gelatin or soup. Progress to regular foods as tolerated. Avoid greasy, spicy, heavy foods. If nausea and/or vomiting occur, drink only clear liquids until the nausea and/or vomiting subsides. Call your physician if vomiting continues.  Special Instructions/Symptoms: Your throat may feel dry or sore from the anesthesia or the breathing tube placed in your throat during surgery. If this causes discomfort, gargle with warm salt water. The discomfort should disappear within 24 hours.  If you had a scopolamine patch placed behind your ear for the management of post- operative nausea and/or vomiting:  1. The medication in the patch is effective for 72 hours, after which it should be removed.  Wrap patch in a tissue and discard in the trash. Wash hands thoroughly with soap and water. 2. You may remove the patch earlier than 72 hours if you experience unpleasant side effects which may include dry mouth, dizziness or visual disturbances. 3. Avoid touching the patch. Wash your hands with soap and water after contact with the patch.

## 2017-05-15 NOTE — Interval H&P Note (Signed)
History and Physical Interval Note:  05/15/2017 7:14 AM  Breanna Lara  has presented today for surgery, with the diagnosis of right chest wall recurrence  The various methods of treatment have been discussed with the patient and family. After consideration of risks, benefits and other options for treatment, the patient has consented to  Procedure(s) with comments: RIGHT CHEST WALL MASS RADIOACTIVE SEED GUIDED EXCISION (Right) - PECTORAL BLOCK as a surgical intervention .  The patient's history has been reviewed, patient examined, no change in status, stable for surgery.  I have reviewed the patient's chart and labs.  Questions were answered to the patient's satisfaction.     Hezakiah Champeau

## 2017-05-15 NOTE — Op Note (Addendum)
Preoperative diagnosis: Right chest wall recurrence of breast cancer Postoperative diagnosis: Same as above Procedure: Excision of right chest wall breast cancer recurrence, 5x2x2 cm Surgeon: Dr. Serita Grammes Anesthesia: Gen. Estimated blood loss: Minimal Complications: None Drains: None Specimens: #1 right chest wall skin and subcutaneous tissue marked with paint #2 additional deep margin that contains radioactive seed marked with paint Spongy [place] was correct 2 Disposition to recovery stable  Indications: This a 46 year old female who has a recurrence of a breast cancer in her right chest wall. She also appears to have stage IV disease at this point. An multidisciplinary fashion we have discussed her case and elected to proceed with local excision of this area. I discussed doing this with radioactive seed guidance.  Procedure: After informed consent was obtained the patient was taken to the operating room. She was given antibiotics. SCDs were in place.She was placed under general anesthesia without complication. She was prepped and draped in the standard sterile surgical fashion. Timeout was performed.  I located the radioactive seed in the right chest wall. I then elected to make an elliptical incision that encompassed all of the skin overlying this mass. I dissected down through the subcutaneous tissue. I identified the muscle and I removed this tissue off of the pectoralis muscle. This was removed and marked with paint. The radioactive seed was actually deep to this and was adherent to the rib. I then removed the remaining pectoralis muscle that was adherent to the ribs that contains radioactive seed as well. There is no more tissue to remove in this position as I'm down to the ribs and intercostals in this area. The seed removal was confirmed by mammography as was the clip. This was also marked with paint. I then obtained hemostasis. Then closed the subcutaneous tissue with 2-0 Vicryl.  Then closed the dermis with 3-0 Vicryl. A close the skin due to her prior radiation therapy with 2-0 nylon sutures. I then placed Dermabond. She tolerated this well was extubated and transferred to recovery stable.

## 2017-05-15 NOTE — Transfer of Care (Signed)
Immediate Anesthesia Transfer of Care Note  Patient: Breanna Lara  Procedure(s) Performed: Procedure(s) with comments: RIGHT CHEST WALL MASS RADIOACTIVE SEED GUIDED EXCISION (Right) - PECTORAL BLOCK  Patient Location: PACU  Anesthesia Type:General  Level of Consciousness: awake, alert  and oriented  Airway & Oxygen Therapy: Patient Spontanous Breathing and Patient connected to face mask oxygen  Post-op Assessment: Report given to RN and Post -op Vital signs reviewed and stable  Post vital signs: Reviewed and stable  Last Vitals:  Vitals:   05/15/17 0649  BP: (!) 144/77  Pulse: 91  Resp: 18  Temp: 36.8 C    Last Pain:  Vitals:   05/15/17 0649  TempSrc: Oral         Complications: No apparent anesthesia complications

## 2017-05-15 NOTE — H&P (Signed)
Breanna Lara is an 46 y.o. female.   Chief Complaint: right chest wall recurrence HPI:  73 yof referred by Dr Breanna Lara presents with recurrent right chest wall mass c/w breast cancer. she has history in 2001 of lumpectomy/chemo/radiation and then due to er/pr positive was on tamoxifen for 5 years. 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 in 2013 she had a chest wall recurrence that was excised in conjunction with an alnd. this was er/pr pos. she has had radiotherapy twice at this point. she also has had ovaries removed in 2014. her genetics have been negative at [place]. she is on tamoxifen. she recently palpated a mass . her left sided mm is negative. US shows a 1.8x0.7x1.8 cm mass in the upper right chest. core biopsy is idc with dcis, grade 2-3, er/pr pos, her2 negative. since then she has undergone mri that shows a 1.8x0.7x2.4 cm mass likely involving the adjacent pec muscle. the nodes are all normal and the left breast is negative. she has also undergone pet scan that shows a hypermetabolic lytic right acetabular lesion consistent with a met and then right chest wall nodule. she is here today to discuss options.   Past Medical History:  Diagnosis Date  . Anemia    H/O  . Anxiety   . ASCUS (atypical squamous cells of undetermined significance) on Pap smear 2009   colpo  . Blood transfusion, without reported diagnosis   . Breast cancer ([place]) 2001, 2010, 2013    S/P Rt mastectomy  BRAC neg  . Ectopic pregnancy, tubal 1998  . Genital HSV   . Hemorrhoids   . History of chemotherapy   . History of radiation therapy 07/15/12-08/19/12   right/axilla/clavicular region 4500 cGy/25 sessions  . History of toe surgery   . Hx of radiation therapy 01/04/01 - 02/19/01   right breast  . Irritable bowel syndrome   . Type O blood, Rh negative     Past Surgical History:  Procedure Laterality Date  . AXILLARY NODE  DISSECTION  04/29/12   right, 0/4 nodes pos, mass bx- met adenocarcinoma  . BREAST LUMPECTOMY  06/05/00   right, ER/PR +, HER 2-  . CHEST WALL BIOPSY  04/07/12   soft tissue, right -metastatic adenocarcinoma, ER/PR +, HER2 -  . CHOLECYSTECTOMY    . EYE SURGERY    . LAPAROSCOPIC BILATERAL SALPINGO OOPHERECTOMY Bilateral 03/08/2013   Procedure: LAPAROSCOPIC BILATERAL SALPINGO OOPHORECTOMY;  Surgeon: Guss Bunde, MD;  Location: [facility];  Service: Gynecology;  Laterality: Bilateral;  . LAPAROSCOPY     salpingectomy  . MASTECTOMY  06/18/09   RT  . NASAL SEPTUM SURGERY     deviated septum    Family History  Problem Relation Age of Onset  . Heart attack Father   . Hypertension Father   . Diabetes type II Father   . Kidney disease Father   . Melanoma Father 20       malignant  . Hypertension Brother   . Hypertension Mother   . Thyroid disease Mother        hypothyroid  . Hypothyroidism Mother   . Thyroid disease Sister        hypothyroid  . Diabetes Sister   . Hypothyroidism Sister   . Cancer Paternal Grandfather        lung  . Cancer Maternal Grandmother        pancreatic  . Breast  cancer Maternal Aunt 60   Social History:  reports that she has never smoked. She has never used smokeless tobacco. She reports that she drinks alcohol. She reports that she does not use drugs.  Allergies:  Allergies  Allergen Reactions  . Percocet [Oxycodone-Acetaminophen] Nausea Only    Medications Prior to Admission  Medication Sig Dispense Refill  . anastrozole (ARIMIDEX) 1 MG tablet Take 1 tablet (1 mg total) by mouth daily. 30 tablet 0  . Black Pepper-Turmeric 3-500 MG CAPS Take 1 tablet by mouth daily.    . calcium carbonate (OS-CAL) 600 MG TABS Take 1 tablet (600 mg total) by mouth 2 (two) times daily with a meal. 60 tablet 12  . cholecalciferol (VITAMIN D) 1000 UNITS tablet Take 1,000 Units by mouth daily.      . fexofenadine (ALLEGRA) 30 MG tablet Take 30 mg by mouth as needed.    .  Misc Natural Products (OSTEO BI-FLEX TRIPLE STRENGTH PO) Take by mouth.    . Probiotic Product (PROBIOTIC DAILY PO) Take 1 tablet by mouth daily.    . Turmeric Curcumin 500 MG CAPS Take by mouth.      No results found for this or any previous visit (from the past 48 hour(s)). No results found.  ROS  Blood pressure (!) 144/77, pulse 91, temperature 98.3 F (36.8 C), temperature source Oral, resp. rate 18, height '5\' 7"'$  (1.702 m), weight 86.7 kg (191 lb 2 oz), SpO2 99 %. Physical Exam  General Mental Status-Alert. Orientation-Oriented X3.  Chest and Lung Exam Chest and lung exam reveals -quiet, even and easy respiratory effort with no use of accessory muscles and on auscultation, normal breath sounds, no adventitious sounds and normal vocal resonance. Breast Cardiovascular Cardiovascular examination reveals -normal heart sounds, regular rate and rhythm with no murmurs. Lymphatic Head & Neck General Head & Neck Lymphatics: Bilateral - Description - Normal. Axillary General Axillary Region: Bilateral - Description - Normal. Note: no [place] adenopathy  Assessment/Plan CHEST WALL RECURRENCE OF BREAST CANCER, RIGHT (C50.911) Story: chest wall recurrence of right breast cancer MRI bilateral breast/chest, oncology appt, staging scans, likely surgery we discussed recurrence. area likely just needs excision first. i think reasonable to consider staging though. she sees oncology this week and will await that appt. Will check mri for other breast as well as to see if this locally invades on the right. I will schedule seed guided excision when above is done as outpatient.    Rolm Bookbinder, MD 05/15/2017, 7:10 AM

## 2017-05-15 NOTE — Anesthesia Procedure Notes (Signed)
Procedure Name: LMA Insertion Date/Time: 05/15/2017 7:37 AM Performed by: Toula Moos L Pre-anesthesia Checklist: Patient identified, Emergency Drugs available, Suction available, Patient being monitored and Timeout performed Patient Re-evaluated:Patient Re-evaluated prior to inductionOxygen Delivery Method: Circle system utilized Preoxygenation: Pre-oxygenation with 100% oxygen Intubation Type: IV induction Ventilation: Mask ventilation without difficulty LMA: LMA inserted LMA Size: 4.0 Number of attempts: 1 Airway Equipment and Method: Bite block Placement Confirmation: positive ETCO2 Tube secured with: Tape Dental Injury: Teeth and Oropharynx as per pre-operative assessment

## 2017-05-15 NOTE — Anesthesia Preprocedure Evaluation (Signed)
Anesthesia Evaluation  Patient identified by MRN, date of birth, ID band Patient awake    Reviewed: Allergy & Precautions, NPO status , Patient's Chart, lab work & pertinent test results  History of Anesthesia Complications Negative for: history of anesthetic complications  Airway Mallampati: I  TM Distance: >3 FB Neck ROM: Full    Dental  (+) Teeth Intact, Dental Advisory Given   Pulmonary neg pulmonary ROS,    breath sounds clear to auscultation       Cardiovascular negative cardio ROS   Rhythm:Regular Rate:Normal  '13 ECHO: EF 60-65%, valves OK   Neuro/Psych negative neurological ROS     GI/Hepatic negative GI ROS, Neg liver ROS,   Endo/Other  negative endocrine ROS  Renal/GU negative Renal ROS     Musculoskeletal   Abdominal   Peds  Hematology negative hematology ROS (+)   Anesthesia Other Findings Stage 4 Breast cancer  Reproductive/Obstetrics negative OB ROS                             Anesthesia Physical Anesthesia Plan  ASA: III  Anesthesia Plan: General   Post-op Pain Management:    Induction: Intravenous  PONV Risk Score and Plan: 3 and Ondansetron, Dexamethasone, Propofol and Midazolam  Airway Management Planned: LMA  Additional Equipment:   Intra-op Plan:   Post-operative Plan:   Informed Consent: I have reviewed the patients History and Physical, chart, labs and discussed the procedure including the risks, benefits and alternatives for the proposed anesthesia with the patient or authorized representative who has indicated his/her understanding and acceptance.   Dental advisory given  Plan Discussed with: CRNA and Surgeon  Anesthesia Plan Comments: (Plan routine monitors, GA- LMA OK)        Anesthesia Quick Evaluation

## 2017-05-15 NOTE — Anesthesia Postprocedure Evaluation (Signed)
Anesthesia Post Note  Patient: Breanna Lara  Procedure(s) Performed: Procedure(s) (LRB): RIGHT CHEST WALL MASS RADIOACTIVE SEED GUIDED EXCISION (Right)     Patient location during evaluation: PACU Anesthesia Type: General Level of consciousness: awake and alert, oriented and patient cooperative Pain management: pain level controlled Vital Signs Assessment: post-procedure vital signs reviewed and stable Respiratory status: spontaneous breathing, nonlabored ventilation and respiratory function stable Cardiovascular status: blood pressure returned to baseline and stable Postop Assessment: no signs of nausea or vomiting Anesthetic complications: no    Last Vitals:  Vitals:   05/15/17 0845 05/15/17 0924  BP: 113/71 112/69  Pulse: 81 69  Resp: 19 18  Temp:  36.4 C    Last Pain:  Vitals:   05/15/17 0924  TempSrc: Oral  PainSc: 3                  Ica Daye,E. Viviene Thurston

## 2017-05-28 DIAGNOSIS — C7989 Secondary malignant neoplasm of other specified sites: Secondary | ICD-10-CM | POA: Diagnosis not present

## 2017-05-28 DIAGNOSIS — Z170 Estrogen receptor positive status [ER+]: Secondary | ICD-10-CM | POA: Diagnosis not present

## 2017-05-28 DIAGNOSIS — C50911 Malignant neoplasm of unspecified site of right female breast: Secondary | ICD-10-CM | POA: Diagnosis not present

## 2017-05-28 DIAGNOSIS — M899 Disorder of bone, unspecified: Secondary | ICD-10-CM | POA: Diagnosis not present

## 2017-05-28 DIAGNOSIS — M898X9 Other specified disorders of bone, unspecified site: Secondary | ICD-10-CM | POA: Diagnosis not present

## 2017-05-28 DIAGNOSIS — M1611 Unilateral primary osteoarthritis, right hip: Secondary | ICD-10-CM | POA: Diagnosis not present

## 2017-06-08 DIAGNOSIS — C50919 Malignant neoplasm of unspecified site of unspecified female breast: Secondary | ICD-10-CM | POA: Diagnosis not present

## 2017-06-08 DIAGNOSIS — Z170 Estrogen receptor positive status [ER+]: Secondary | ICD-10-CM | POA: Diagnosis not present

## 2017-06-09 DIAGNOSIS — C50911 Malignant neoplasm of unspecified site of right female breast: Secondary | ICD-10-CM | POA: Diagnosis not present

## 2017-06-23 DIAGNOSIS — M898X8 Other specified disorders of bone, other site: Secondary | ICD-10-CM | POA: Diagnosis not present

## 2017-06-23 DIAGNOSIS — C50919 Malignant neoplasm of unspecified site of unspecified female breast: Secondary | ICD-10-CM | POA: Diagnosis not present

## 2017-06-23 DIAGNOSIS — C50911 Malignant neoplasm of unspecified site of right female breast: Secondary | ICD-10-CM | POA: Diagnosis not present

## 2017-06-29 DIAGNOSIS — C50919 Malignant neoplasm of unspecified site of unspecified female breast: Secondary | ICD-10-CM | POA: Diagnosis not present

## 2017-07-06 DIAGNOSIS — Z5112 Encounter for antineoplastic immunotherapy: Secondary | ICD-10-CM | POA: Diagnosis not present

## 2017-07-06 DIAGNOSIS — Z79899 Other long term (current) drug therapy: Secondary | ICD-10-CM | POA: Diagnosis not present

## 2017-07-06 DIAGNOSIS — C50919 Malignant neoplasm of unspecified site of unspecified female breast: Secondary | ICD-10-CM | POA: Diagnosis not present

## 2017-07-15 DIAGNOSIS — C50919 Malignant neoplasm of unspecified site of unspecified female breast: Secondary | ICD-10-CM | POA: Diagnosis not present

## 2017-07-15 DIAGNOSIS — C419 Malignant neoplasm of bone and articular cartilage, unspecified: Secondary | ICD-10-CM | POA: Diagnosis not present

## 2017-07-16 ENCOUNTER — Inpatient Hospital Stay: Admission: RE | Admit: 2017-07-16 | Payer: 59 | Source: Ambulatory Visit

## 2017-07-16 DIAGNOSIS — M9903 Segmental and somatic dysfunction of lumbar region: Secondary | ICD-10-CM | POA: Diagnosis not present

## 2017-07-16 DIAGNOSIS — M9901 Segmental and somatic dysfunction of cervical region: Secondary | ICD-10-CM | POA: Diagnosis not present

## 2017-07-16 DIAGNOSIS — M9902 Segmental and somatic dysfunction of thoracic region: Secondary | ICD-10-CM | POA: Diagnosis not present

## 2017-07-20 DIAGNOSIS — C50919 Malignant neoplasm of unspecified site of unspecified female breast: Secondary | ICD-10-CM | POA: Diagnosis not present

## 2017-07-30 DIAGNOSIS — M9901 Segmental and somatic dysfunction of cervical region: Secondary | ICD-10-CM | POA: Diagnosis not present

## 2017-07-30 DIAGNOSIS — L918 Other hypertrophic disorders of the skin: Secondary | ICD-10-CM | POA: Diagnosis not present

## 2017-07-30 DIAGNOSIS — L920 Granuloma annulare: Secondary | ICD-10-CM | POA: Diagnosis not present

## 2017-07-30 DIAGNOSIS — M9903 Segmental and somatic dysfunction of lumbar region: Secondary | ICD-10-CM | POA: Diagnosis not present

## 2017-07-30 DIAGNOSIS — M9902 Segmental and somatic dysfunction of thoracic region: Secondary | ICD-10-CM | POA: Diagnosis not present

## 2017-08-04 DIAGNOSIS — C50919 Malignant neoplasm of unspecified site of unspecified female breast: Secondary | ICD-10-CM | POA: Diagnosis not present

## 2017-08-19 DIAGNOSIS — C50919 Malignant neoplasm of unspecified site of unspecified female breast: Secondary | ICD-10-CM | POA: Diagnosis not present

## 2017-08-19 DIAGNOSIS — C7951 Secondary malignant neoplasm of bone: Secondary | ICD-10-CM | POA: Diagnosis not present

## 2017-08-19 DIAGNOSIS — C50911 Malignant neoplasm of unspecified site of right female breast: Secondary | ICD-10-CM | POA: Diagnosis not present

## 2017-08-19 DIAGNOSIS — R51 Headache: Secondary | ICD-10-CM | POA: Diagnosis not present

## 2017-08-27 DIAGNOSIS — M9903 Segmental and somatic dysfunction of lumbar region: Secondary | ICD-10-CM | POA: Diagnosis not present

## 2017-08-27 DIAGNOSIS — M9901 Segmental and somatic dysfunction of cervical region: Secondary | ICD-10-CM | POA: Diagnosis not present

## 2017-08-27 DIAGNOSIS — M9902 Segmental and somatic dysfunction of thoracic region: Secondary | ICD-10-CM | POA: Diagnosis not present

## 2017-09-09 DIAGNOSIS — M9902 Segmental and somatic dysfunction of thoracic region: Secondary | ICD-10-CM | POA: Diagnosis not present

## 2017-09-09 DIAGNOSIS — M9903 Segmental and somatic dysfunction of lumbar region: Secondary | ICD-10-CM | POA: Diagnosis not present

## 2017-09-09 DIAGNOSIS — M9901 Segmental and somatic dysfunction of cervical region: Secondary | ICD-10-CM | POA: Diagnosis not present

## 2017-10-07 DIAGNOSIS — M9901 Segmental and somatic dysfunction of cervical region: Secondary | ICD-10-CM | POA: Diagnosis not present

## 2017-10-07 DIAGNOSIS — M9903 Segmental and somatic dysfunction of lumbar region: Secondary | ICD-10-CM | POA: Diagnosis not present

## 2017-10-07 DIAGNOSIS — M9902 Segmental and somatic dysfunction of thoracic region: Secondary | ICD-10-CM | POA: Diagnosis not present

## 2017-10-14 DIAGNOSIS — C50919 Malignant neoplasm of unspecified site of unspecified female breast: Secondary | ICD-10-CM | POA: Diagnosis not present

## 2017-10-14 DIAGNOSIS — Z5112 Encounter for antineoplastic immunotherapy: Secondary | ICD-10-CM | POA: Diagnosis not present

## 2017-10-14 DIAGNOSIS — C7951 Secondary malignant neoplasm of bone: Secondary | ICD-10-CM | POA: Diagnosis not present

## 2017-10-26 DIAGNOSIS — C50919 Malignant neoplasm of unspecified site of unspecified female breast: Secondary | ICD-10-CM | POA: Diagnosis not present

## 2017-10-28 DIAGNOSIS — C50919 Malignant neoplasm of unspecified site of unspecified female breast: Secondary | ICD-10-CM | POA: Diagnosis not present

## 2017-11-03 ENCOUNTER — Telehealth: Payer: Self-pay | Admitting: Hematology and Oncology

## 2017-11-03 NOTE — Telephone Encounter (Signed)
Spoke with patient re moving appt from 12/21 to 12/19. Schedule mailed

## 2017-11-05 DIAGNOSIS — M9902 Segmental and somatic dysfunction of thoracic region: Secondary | ICD-10-CM | POA: Diagnosis not present

## 2017-11-05 DIAGNOSIS — M9903 Segmental and somatic dysfunction of lumbar region: Secondary | ICD-10-CM | POA: Diagnosis not present

## 2017-11-05 DIAGNOSIS — M9901 Segmental and somatic dysfunction of cervical region: Secondary | ICD-10-CM | POA: Diagnosis not present

## 2017-11-13 DIAGNOSIS — Z5112 Encounter for antineoplastic immunotherapy: Secondary | ICD-10-CM | POA: Diagnosis not present

## 2017-11-13 DIAGNOSIS — C50919 Malignant neoplasm of unspecified site of unspecified female breast: Secondary | ICD-10-CM | POA: Diagnosis not present

## 2017-11-16 ENCOUNTER — Ambulatory Visit: Payer: 59 | Admitting: Hematology and Oncology

## 2017-11-18 ENCOUNTER — Ambulatory Visit: Payer: 59 | Admitting: Hematology and Oncology

## 2017-11-20 ENCOUNTER — Ambulatory Visit: Payer: 59 | Admitting: Hematology and Oncology

## 2017-11-26 ENCOUNTER — Ambulatory Visit (HOSPITAL_BASED_OUTPATIENT_CLINIC_OR_DEPARTMENT_OTHER): Payer: 59 | Admitting: Hematology and Oncology

## 2017-11-26 ENCOUNTER — Telehealth: Payer: Self-pay | Admitting: Hematology and Oncology

## 2017-11-26 DIAGNOSIS — Z170 Estrogen receptor positive status [ER+]: Secondary | ICD-10-CM | POA: Diagnosis not present

## 2017-11-26 DIAGNOSIS — C50411 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 female breast: Secondary | ICD-10-CM

## 2017-11-26 DIAGNOSIS — M899 Disorder of bone, unspecified: Secondary | ICD-10-CM | POA: Diagnosis not present

## 2017-11-26 DIAGNOSIS — Z79811 Long term (current) use of aromatase inhibitors: Secondary | ICD-10-CM | POA: Diagnosis not present

## 2017-11-26 NOTE — Progress Notes (Signed)
Patient Care Team: Jonathon Jordan, MD as PCP - General (Family Medicine)  DIAGNOSIS:  Encounter Diagnosis  Name Primary?  . 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 breast in female, estrogen receptor positive ([place])     SUMMARY OF ONCOLOGIC HISTORY:   Breast cancer of upper-outer quadrant of right female breast ([place])   05/01/2000 Initial Biopsy    Right breast invasive ductal carcinoma stage II treated with lumpectomy followed by chemotherapy Adriamycin and Taxotere on CALGB 67124 study followed by 5 years of tamoxifen      05/31/2009 Relapse/Recurrence    Right breast recurrent cancer: 1.6 cm with LVI Triple negative status post mastectomy followed by adjuvant chemotherapy Taxol and carboplatin completed December 2010      03/31/2012 Relapse/Recurrence    Chest wall recurrence diagnosed at [facility] ER/PR positive status post right axillary lymph node dissection for lymph nodes negative: 3.1 cm adenocarcinoma with perineural invasion, could be soft tissue met versus lymph node      06/10/2012 - 09/23/2012 Chemotherapy    CMF chemotherapy x6 cycles      07/15/2012 - 08/19/2012 Radiation Therapy    Concurrent with radiation patient received Cytoxan and 5-FU cycles 2 to 4      09/30/2012 -  Anti-estrogen oral therapy    Tamoxifen 20 mg daily switched to Aromasin and then switched to anastrozole January 2017       Surgery    Bilateral salpingo-oophorectomy      04/08/2017 Relapse/Recurrence    Palpable lump right chest wall 1.8 cm by ultrasound: Invasive ductal carcinoma with DCIS grade 2-3 with suspicion for lymphovascular invasion. ER 80%, PR 15%, HER-2 negative ratio 1.32, Ki-67 20%      04/24/2017 PET scan    Small right chest wall nodule is hypermetabolic and suggestive of recurrent chest wall tumor, hypermetabolic lytic right acetabular lesion concerning for skeletal metastases       05/2017 -  Anti-estrogen oral therapy    anastrozole 05/2017, and  palbociclib in 08/2017, and denosumab 07/2017      05/2017 Pathology Results    Surgical excision with reported positive margins. PET scan revealed met right acetabulum, confirmed by biopsy 05/2017      10/26/2017 PET scan    Further decrease in avidity right acetabular osseous lesion, now similar to surrounding bone. No definitive evidence of recurrent or new metastatic disease.- Interval resolution of the hypermetabolic activity associated with the skin thickening in the lateral aspect of the right chest wall.           Miscellaneous    Genetics: Her testing was BRCA1/BRCA2 with BART, and was negative on NCGENES and Clinical Genetics felt no additional testing warranted       CHIEF COMPLIANT: Follow-up of metastatic breast cancer.  Patient is currently undergoing treatment at [facility] with Dr. Janan Halter  INTERVAL HISTORY: Breanna Lara is a 46 year old with above-mentioned history metastatic breast cancer who is currently undergoing palliative treatment with Ibrance and letrozole along with Xgeva for her bones.  She had been tolerating the treatment fairly well with the exception of fatigue which is a known side effect of Ibrance.  She could not tolerate Zometa so she is currently on Xgeva.  She has been getting it once a month.  REVIEW OF SYSTEMS:   Constitutional: Denies fevers, chills or abnormal weight loss Eyes: Denies blurriness of vision Ears, nose, mouth, throat, and face: Denies mucositis or sore throat Respiratory: Denies cough, dyspnea or wheezes  Cardiovascular: Denies palpitation, chest discomfort Gastrointestinal:  Denies nausea, heartburn or change in bowel habits Skin: Denies abnormal skin rashes Lymphatics: Denies new lymphadenopathy or easy bruising Neurological:Denies numbness, tingling or new weaknesses Behavioral/Psych: Mood is stable, no new changes  Extremities: No lower extremity edema  All other systems were reviewed with the patient and are  negative.  I have reviewed the past medical history, past surgical history, social history and family history with the patient and they are unchanged from previous note.  ALLERGIES:  is allergic to percocet [oxycodone-acetaminophen].  MEDICATIONS:  Current Outpatient Medications  Medication Sig Dispense Refill  . anastrozole (ARIMIDEX) 1 MG tablet Take 1 tablet (1 mg total) by mouth daily. 30 tablet 0  . Black Pepper-Turmeric 3-500 MG CAPS Take 1 tablet by mouth daily.    . calcium carbonate (OS-CAL) 600 MG TABS Take 1 tablet (600 mg total) by mouth 2 (two) times daily with a meal. 60 tablet 12  . cholecalciferol (VITAMIN D) 1000 UNITS tablet Take 1,000 Units by mouth daily.      . fexofenadine (ALLEGRA) 30 MG tablet Take 30 mg by mouth as needed.    Marland Kitchen HYDROcodone-acetaminophen (NORCO/VICODIN) 5-325 MG tablet Take 1 tablet by mouth every 4 (four) hours as needed. 20 tablet 0  . Misc Natural Products (OSTEO BI-FLEX TRIPLE STRENGTH PO) Take by mouth.    . Probiotic Product (PROBIOTIC DAILY PO) Take 1 tablet by mouth daily.    . Turmeric Curcumin 500 MG CAPS Take by mouth.     No current facility-administered medications for this visit.     PHYSICAL EXAMINATION: ECOG PERFORMANCE STATUS: 1 - Symptomatic but completely ambulatory  Vitals:   11/26/17 1109  BP: 125/79  Pulse: 89  Resp: 18  Temp: 98.8 F (37.1 C)  SpO2: 100%   Filed Weights   11/26/17 1109  Weight: 198 lb 9.6 oz (90.1 kg)    GENERAL:alert, no distress and comfortable SKIN: skin color, texture, turgor are normal, no rashes or significant lesions EYES: normal, Conjunctiva are pink and non-injected, sclera clear OROPHARYNX:no exudate, no erythema and lips, buccal mucosa, and tongue normal  NECK: supple, thyroid normal size, non-tender, without nodularity LYMPH:  no palpable lymphadenopathy in the cervical, axillary or inguinal LUNGS: clear to auscultation and percussion with normal breathing effort HEART: regular  rate & rhythm and no murmurs and no lower extremity edema ABDOMEN:abdomen soft, non-tender and normal bowel sounds MUSCULOSKELETAL:no cyanosis of digits and no clubbing  NEURO: alert & oriented x 3 with fluent speech, no focal motor/sensory deficits EXTREMITIES: No lower extremity edema  LABORATORY DATA:  I have reviewed the data as listed   Chemistry      Component Value Date/Time   NA 142 12/19/2015 1003   K 3.9 12/19/2015 1003   CL 103 01/07/2013 1137   CO2 28 12/19/2015 1003   BUN 12.9 12/19/2015 1003   CREATININE 0.9 12/19/2015 1003      Component Value Date/Time   CALCIUM 9.3 12/19/2015 1003   ALKPHOS 67 12/19/2015 1003   AST 22 12/19/2015 1003   ALT 26 12/19/2015 1003   BILITOT 0.50 12/19/2015 1003       Lab Results  Component Value Date   WBC 7.2 12/19/2015   HGB 12.1 12/19/2015   HCT 37.2 12/19/2015   MCV 87.1 12/19/2015   PLT 267 12/19/2015   NEUTROABS 4.5 12/19/2015    ASSESSMENT & PLAN:  Breast cancer of upper-outer quadrant of right female breast ([place]) Right breast invasive  ductal carcinoma stage II diagnosed 05/01/2000 treated with lumpectomy and adjuvant chemotherapy CALGB 11643 study followed by 5 years of tamoxifen Recurrent right breast cancer 05/31/2009 status post mastectomy followed by adjuvant chemotherapy with Taxol and carboplatin Recurrent breast cancer with chest wall recurrence diagnosed at [facility] status post resection and axillary lymph node dissection followed by CMF chemotherapy followed by radiation  Current treatment: Antiestrogen therapy with tamoxifen started 09/30/2012 status post bilateral salpingo-oophorectomy started Femara in January 2016 could not tolerate it switched to Aromasin 02/01/2015, could not tolerate anastrozole either. Switched to Tamoxifen 20 daily 04/09/16  Relapse: 04/08/2017 Palpable lump right chest wall 1.8 cm by ultrasound: Invasive ductal carcinoma with DCIS grade 2-3 with suspicion for lymphovascular  invasion. ER 80%, PR 15%, HER-2 negative ratio 1.32, Ki-67 20%  July 2018 at [facility]: Surgical excision with reported positive margins. PET scan revealed met right acetabulum, confirmed by biopsy 05/2017   PET/CT scan 10/26/2017: Further decrease in avidity right acetabular osseous lesion, now similar to surrounding bone. No definitive evidence of recurrent or new metastatic disease.- Interval resolution of the hypermetabolic activity associated with the skin thickening in the lateral aspect of the right chest wall.  Current treatment: Anastrozole 05/2017, and palbociclib in 08/2017, and denosumab 07/2017 Patient would like to follow with [place] periodically either for blood count checks her Xgeva injections. I instructed her to discuss this with her primary oncologist team and we are happy to assist the patient in any way the patient needs closer to home.   I spent 25 minutes talking to the patient of which more than half was spent in counseling and coordination of care.  No orders of the defined types were placed in this encounter.  The patient has a good understanding of the overall plan. she agrees with it. she will call with any problems that may develop before the next visit here.   Harriette Ohara, MD 11/26/17

## 2017-11-26 NOTE — Assessment & Plan Note (Signed)
Right breast invasive ductal carcinoma stage II diagnosed 05/01/2000 treated with lumpectomy and adjuvant chemotherapy CALGB 28786 study followed by 5 years of tamoxifen Recurrent right breast cancer 05/31/2009 status post mastectomy followed by adjuvant chemotherapy with Taxol and carboplatin Recurrent breast cancer with chest wall recurrence diagnosed at [facility] status post resection and axillary lymph node dissection followed by CMF chemotherapy followed by radiation  Current treatment: Antiestrogen therapy with tamoxifen started 09/30/2012 status post bilateral salpingo-oophorectomy started Femara in January 2016 could not tolerate it switched to Aromasin 02/01/2015, could not tolerate anastrozole either. Switched to Tamoxifen 20 daily 04/09/16  Relapse: 04/08/2017 Palpable lump right chest wall 1.8 cm by ultrasound: Invasive ductal carcinoma with DCIS grade 2-3 with suspicion for lymphovascular invasion. ER 80%, PR 15%, HER-2 negative ratio 1.32, Ki-67 20%  July 2018 at [facility]: Surgical excision with reported positive margins. PET scan revealed met right acetabulum, confirmed by biopsy 05/2017    PET/CT scan 10/26/2017: Further decrease in avidity right acetabular osseous lesion, now similar to surrounding bone. No definitive evidence of recurrent or new metastatic disease.- Interval resolution of the hypermetabolic activity associated with the skin thickening in the lateral aspect of the right chest wall.  Current treatment: Anastrozole 05/2017, and palbociclib in 08/2017, and denosumab 07/2017

## 2017-11-26 NOTE — Telephone Encounter (Signed)
No 12/27 los.  

## 2017-11-27 DIAGNOSIS — C50911 Malignant neoplasm of unspecified site of right female breast: Secondary | ICD-10-CM | POA: Diagnosis not present

## 2017-12-16 DIAGNOSIS — Z79811 Long term (current) use of aromatase inhibitors: Secondary | ICD-10-CM | POA: Diagnosis not present

## 2017-12-16 DIAGNOSIS — C50919 Malignant neoplasm of unspecified site of unspecified female breast: Secondary | ICD-10-CM | POA: Diagnosis not present

## 2017-12-16 DIAGNOSIS — Z9049 Acquired absence of other specified parts of digestive tract: Secondary | ICD-10-CM | POA: Diagnosis not present

## 2017-12-22 DIAGNOSIS — M9901 Segmental and somatic dysfunction of cervical region: Secondary | ICD-10-CM | POA: Diagnosis not present

## 2017-12-22 DIAGNOSIS — M9902 Segmental and somatic dysfunction of thoracic region: Secondary | ICD-10-CM | POA: Diagnosis not present

## 2017-12-22 DIAGNOSIS — M9903 Segmental and somatic dysfunction of lumbar region: Secondary | ICD-10-CM | POA: Diagnosis not present

## 2017-12-25 ENCOUNTER — Other Ambulatory Visit: Payer: Self-pay | Admitting: General Surgery

## 2017-12-25 DIAGNOSIS — L920 Granuloma annulare: Secondary | ICD-10-CM | POA: Diagnosis not present

## 2017-12-25 DIAGNOSIS — L928 Other granulomatous disorders of the skin and subcutaneous tissue: Secondary | ICD-10-CM | POA: Diagnosis not present

## 2018-01-05 DIAGNOSIS — L920 Granuloma annulare: Secondary | ICD-10-CM | POA: Diagnosis not present

## 2018-01-18 DIAGNOSIS — C50919 Malignant neoplasm of unspecified site of unspecified female breast: Secondary | ICD-10-CM | POA: Diagnosis not present

## 2018-01-18 DIAGNOSIS — C7951 Secondary malignant neoplasm of bone: Secondary | ICD-10-CM | POA: Diagnosis not present

## 2018-01-18 DIAGNOSIS — C50911 Malignant neoplasm of unspecified site of right female breast: Secondary | ICD-10-CM | POA: Diagnosis not present

## 2018-01-18 DIAGNOSIS — Z5112 Encounter for antineoplastic immunotherapy: Secondary | ICD-10-CM | POA: Diagnosis not present

## 2018-01-28 DIAGNOSIS — M9901 Segmental and somatic dysfunction of cervical region: Secondary | ICD-10-CM | POA: Diagnosis not present

## 2018-01-28 DIAGNOSIS — M9902 Segmental and somatic dysfunction of thoracic region: Secondary | ICD-10-CM | POA: Diagnosis not present

## 2018-01-28 DIAGNOSIS — M9903 Segmental and somatic dysfunction of lumbar region: Secondary | ICD-10-CM | POA: Diagnosis not present

## 2018-02-19 DIAGNOSIS — C50919 Malignant neoplasm of unspecified site of unspecified female breast: Secondary | ICD-10-CM | POA: Diagnosis not present

## 2018-02-19 DIAGNOSIS — M899 Disorder of bone, unspecified: Secondary | ICD-10-CM | POA: Diagnosis not present

## 2018-02-24 DIAGNOSIS — C50919 Malignant neoplasm of unspecified site of unspecified female breast: Secondary | ICD-10-CM | POA: Diagnosis not present

## 2018-02-24 DIAGNOSIS — Z79811 Long term (current) use of aromatase inhibitors: Secondary | ICD-10-CM | POA: Diagnosis not present

## 2018-02-24 DIAGNOSIS — Z5112 Encounter for antineoplastic immunotherapy: Secondary | ICD-10-CM | POA: Diagnosis not present

## 2018-02-24 DIAGNOSIS — C50911 Malignant neoplasm of unspecified site of right female breast: Secondary | ICD-10-CM | POA: Diagnosis not present

## 2018-03-11 ENCOUNTER — Telehealth: Payer: Self-pay | Admitting: Obstetrics & Gynecology

## 2018-03-11 ENCOUNTER — Other Ambulatory Visit: Payer: Self-pay | Admitting: *Deleted

## 2018-03-11 DIAGNOSIS — M9903 Segmental and somatic dysfunction of lumbar region: Secondary | ICD-10-CM | POA: Diagnosis not present

## 2018-03-11 DIAGNOSIS — M9902 Segmental and somatic dysfunction of thoracic region: Secondary | ICD-10-CM | POA: Diagnosis not present

## 2018-03-11 DIAGNOSIS — M9901 Segmental and somatic dysfunction of cervical region: Secondary | ICD-10-CM | POA: Diagnosis not present

## 2018-03-11 MED ORDER — HYDROCORTISONE ACE-PRAMOXINE 1-1 % RE FOAM: 1.0000 | Freq: Two times a day (BID) | RECTAL | 0 refills | Status: DC

## 2018-03-11 NOTE — Telephone Encounter (Signed)
Pt called requesting a RF on her proctofoam HC for hemorrhoids.  Pt states that because of the meds she takes constipation is an issue.  Encouraged patient to also use stool softners or Miralax daily.

## 2018-03-11 NOTE — Telephone Encounter (Signed)
Pt request refill hemorrhoid cream called in to [facility] in [place].

## 2018-03-24 DIAGNOSIS — C50919 Malignant neoplasm of unspecified site of unspecified female breast: Secondary | ICD-10-CM | POA: Diagnosis not present

## 2018-03-24 DIAGNOSIS — Z5112 Encounter for antineoplastic immunotherapy: Secondary | ICD-10-CM | POA: Diagnosis not present

## 2018-04-04 ENCOUNTER — Other Ambulatory Visit: Payer: Self-pay | Admitting: Hematology and Oncology

## 2018-04-07 DIAGNOSIS — M9902 Segmental and somatic dysfunction of thoracic region: Secondary | ICD-10-CM | POA: Diagnosis not present

## 2018-04-07 DIAGNOSIS — M9901 Segmental and somatic dysfunction of cervical region: Secondary | ICD-10-CM | POA: Diagnosis not present

## 2018-04-07 DIAGNOSIS — M9903 Segmental and somatic dysfunction of lumbar region: Secondary | ICD-10-CM | POA: Diagnosis not present

## 2018-04-21 DIAGNOSIS — Z885 Allergy status to narcotic agent status: Secondary | ICD-10-CM | POA: Diagnosis not present

## 2018-04-21 DIAGNOSIS — C50919 Malignant neoplasm of unspecified site of unspecified female breast: Secondary | ICD-10-CM | POA: Diagnosis not present

## 2018-04-21 DIAGNOSIS — C50911 Malignant neoplasm of unspecified site of right female breast: Secondary | ICD-10-CM | POA: Diagnosis not present

## 2018-04-21 DIAGNOSIS — M79661 Pain in right lower leg: Secondary | ICD-10-CM | POA: Diagnosis not present

## 2018-05-06 ENCOUNTER — Other Ambulatory Visit: Payer: Self-pay | Admitting: Oncology

## 2018-05-13 DIAGNOSIS — M9901 Segmental and somatic dysfunction of cervical region: Secondary | ICD-10-CM | POA: Diagnosis not present

## 2018-05-13 DIAGNOSIS — M9902 Segmental and somatic dysfunction of thoracic region: Secondary | ICD-10-CM | POA: Diagnosis not present

## 2018-05-13 DIAGNOSIS — M9903 Segmental and somatic dysfunction of lumbar region: Secondary | ICD-10-CM | POA: Diagnosis not present

## 2018-05-19 DIAGNOSIS — C50919 Malignant neoplasm of unspecified site of unspecified female breast: Secondary | ICD-10-CM | POA: Diagnosis not present

## 2018-05-19 DIAGNOSIS — C7989 Secondary malignant neoplasm of other specified sites: Secondary | ICD-10-CM | POA: Diagnosis not present

## 2018-05-25 DIAGNOSIS — M9901 Segmental and somatic dysfunction of cervical region: Secondary | ICD-10-CM | POA: Diagnosis not present

## 2018-05-25 DIAGNOSIS — M9902 Segmental and somatic dysfunction of thoracic region: Secondary | ICD-10-CM | POA: Diagnosis not present

## 2018-05-25 DIAGNOSIS — M9903 Segmental and somatic dysfunction of lumbar region: Secondary | ICD-10-CM | POA: Diagnosis not present

## 2018-05-31 ENCOUNTER — Other Ambulatory Visit: Payer: Self-pay | Admitting: Obstetrics & Gynecology

## 2018-06-02 DIAGNOSIS — Z13220 Encounter for screening for lipoid disorders: Secondary | ICD-10-CM | POA: Diagnosis not present

## 2018-06-02 DIAGNOSIS — Z0000 Encounter for general adult medical examination without abnormal findings: Secondary | ICD-10-CM | POA: Diagnosis not present

## 2018-06-09 ENCOUNTER — Encounter: Payer: Self-pay | Admitting: Obstetrics & Gynecology

## 2018-06-22 DIAGNOSIS — M9902 Segmental and somatic dysfunction of thoracic region: Secondary | ICD-10-CM | POA: Diagnosis not present

## 2018-06-22 DIAGNOSIS — M9901 Segmental and somatic dysfunction of cervical region: Secondary | ICD-10-CM | POA: Diagnosis not present

## 2018-06-22 DIAGNOSIS — M9903 Segmental and somatic dysfunction of lumbar region: Secondary | ICD-10-CM | POA: Diagnosis not present

## 2018-06-25 DIAGNOSIS — R9389 Abnormal findings on diagnostic imaging of other specified body structures: Secondary | ICD-10-CM | POA: Diagnosis not present

## 2018-06-25 DIAGNOSIS — C50919 Malignant neoplasm of unspecified site of unspecified female breast: Secondary | ICD-10-CM | POA: Diagnosis not present

## 2018-06-30 DIAGNOSIS — C50919 Malignant neoplasm of unspecified site of unspecified female breast: Secondary | ICD-10-CM | POA: Diagnosis not present

## 2018-06-30 DIAGNOSIS — C7951 Secondary malignant neoplasm of bone: Secondary | ICD-10-CM | POA: Diagnosis not present

## 2018-06-30 DIAGNOSIS — C799 Secondary malignant neoplasm of unspecified site: Secondary | ICD-10-CM | POA: Diagnosis not present

## 2018-06-30 DIAGNOSIS — Z885 Allergy status to narcotic agent status: Secondary | ICD-10-CM | POA: Diagnosis not present

## 2018-06-30 DIAGNOSIS — C50911 Malignant neoplasm of unspecified site of right female breast: Secondary | ICD-10-CM | POA: Diagnosis not present

## 2018-07-29 DIAGNOSIS — M9901 Segmental and somatic dysfunction of cervical region: Secondary | ICD-10-CM | POA: Diagnosis not present

## 2018-07-29 DIAGNOSIS — M9903 Segmental and somatic dysfunction of lumbar region: Secondary | ICD-10-CM | POA: Diagnosis not present

## 2018-07-29 DIAGNOSIS — M9902 Segmental and somatic dysfunction of thoracic region: Secondary | ICD-10-CM | POA: Diagnosis not present

## 2018-08-06 DIAGNOSIS — C7951 Secondary malignant neoplasm of bone: Secondary | ICD-10-CM | POA: Diagnosis not present

## 2018-08-06 DIAGNOSIS — C50919 Malignant neoplasm of unspecified site of unspecified female breast: Secondary | ICD-10-CM | POA: Diagnosis not present

## 2018-08-31 DIAGNOSIS — M9901 Segmental and somatic dysfunction of cervical region: Secondary | ICD-10-CM | POA: Diagnosis not present

## 2018-08-31 DIAGNOSIS — M9902 Segmental and somatic dysfunction of thoracic region: Secondary | ICD-10-CM | POA: Diagnosis not present

## 2018-08-31 DIAGNOSIS — M9903 Segmental and somatic dysfunction of lumbar region: Secondary | ICD-10-CM | POA: Diagnosis not present

## 2018-09-03 DIAGNOSIS — C50919 Malignant neoplasm of unspecified site of unspecified female breast: Secondary | ICD-10-CM | POA: Diagnosis not present

## 2018-09-08 DIAGNOSIS — L920 Granuloma annulare: Secondary | ICD-10-CM | POA: Diagnosis not present

## 2018-10-01 DIAGNOSIS — C50919 Malignant neoplasm of unspecified site of unspecified female breast: Secondary | ICD-10-CM | POA: Diagnosis not present

## 2018-10-11 DIAGNOSIS — M9902 Segmental and somatic dysfunction of thoracic region: Secondary | ICD-10-CM | POA: Diagnosis not present

## 2018-10-11 DIAGNOSIS — M9901 Segmental and somatic dysfunction of cervical region: Secondary | ICD-10-CM | POA: Diagnosis not present

## 2018-10-11 DIAGNOSIS — M9903 Segmental and somatic dysfunction of lumbar region: Secondary | ICD-10-CM | POA: Diagnosis not present

## 2018-10-15 DIAGNOSIS — C7951 Secondary malignant neoplasm of bone: Secondary | ICD-10-CM | POA: Diagnosis not present

## 2018-10-15 DIAGNOSIS — C50919 Malignant neoplasm of unspecified site of unspecified female breast: Secondary | ICD-10-CM | POA: Diagnosis not present

## 2018-10-20 DIAGNOSIS — C50919 Malignant neoplasm of unspecified site of unspecified female breast: Secondary | ICD-10-CM | POA: Diagnosis not present

## 2018-10-20 DIAGNOSIS — M25551 Pain in right hip: Secondary | ICD-10-CM | POA: Diagnosis not present

## 2018-10-20 DIAGNOSIS — C50911 Malignant neoplasm of unspecified site of right female breast: Secondary | ICD-10-CM | POA: Diagnosis not present

## 2018-10-20 DIAGNOSIS — C7951 Secondary malignant neoplasm of bone: Secondary | ICD-10-CM | POA: Diagnosis not present

## 2018-10-22 ENCOUNTER — Other Ambulatory Visit: Payer: Self-pay | Admitting: Obstetrics & Gynecology

## 2018-10-26 DIAGNOSIS — C7981 Secondary malignant neoplasm of breast: Secondary | ICD-10-CM | POA: Diagnosis not present

## 2018-10-26 DIAGNOSIS — C7951 Secondary malignant neoplasm of bone: Secondary | ICD-10-CM | POA: Diagnosis not present

## 2018-10-26 DIAGNOSIS — C50919 Malignant neoplasm of unspecified site of unspecified female breast: Secondary | ICD-10-CM | POA: Diagnosis not present

## 2018-10-26 DIAGNOSIS — Z170 Estrogen receptor positive status [ER+]: Secondary | ICD-10-CM | POA: Diagnosis not present

## 2018-10-26 DIAGNOSIS — M25551 Pain in right hip: Secondary | ICD-10-CM | POA: Diagnosis not present

## 2018-11-03 DIAGNOSIS — C50811 Malignant neoplasm of overlapping sites of right female breast: Secondary | ICD-10-CM | POA: Diagnosis not present

## 2018-11-03 DIAGNOSIS — C7951 Secondary malignant neoplasm of bone: Secondary | ICD-10-CM | POA: Diagnosis not present

## 2018-11-10 DIAGNOSIS — C50811 Malignant neoplasm of overlapping sites of right female breast: Secondary | ICD-10-CM | POA: Diagnosis not present

## 2018-11-10 DIAGNOSIS — C7951 Secondary malignant neoplasm of bone: Secondary | ICD-10-CM | POA: Diagnosis not present

## 2018-11-11 DIAGNOSIS — C7951 Secondary malignant neoplasm of bone: Secondary | ICD-10-CM | POA: Diagnosis not present

## 2018-11-11 DIAGNOSIS — C50811 Malignant neoplasm of overlapping sites of right female breast: Secondary | ICD-10-CM | POA: Diagnosis not present

## 2018-11-15 DIAGNOSIS — C50811 Malignant neoplasm of overlapping sites of right female breast: Secondary | ICD-10-CM | POA: Diagnosis not present

## 2018-11-15 DIAGNOSIS — C7951 Secondary malignant neoplasm of bone: Secondary | ICD-10-CM | POA: Diagnosis not present

## 2018-11-16 DIAGNOSIS — C7951 Secondary malignant neoplasm of bone: Secondary | ICD-10-CM | POA: Diagnosis not present

## 2018-11-16 DIAGNOSIS — C50811 Malignant neoplasm of overlapping sites of right female breast: Secondary | ICD-10-CM | POA: Diagnosis not present

## 2018-11-17 DIAGNOSIS — C50811 Malignant neoplasm of overlapping sites of right female breast: Secondary | ICD-10-CM | POA: Diagnosis not present

## 2018-11-17 DIAGNOSIS — C7951 Secondary malignant neoplasm of bone: Secondary | ICD-10-CM | POA: Diagnosis not present

## 2018-11-18 DIAGNOSIS — C7951 Secondary malignant neoplasm of bone: Secondary | ICD-10-CM | POA: Diagnosis not present

## 2018-11-18 DIAGNOSIS — C50811 Malignant neoplasm of overlapping sites of right female breast: Secondary | ICD-10-CM | POA: Diagnosis not present

## 2018-11-19 DIAGNOSIS — C50811 Malignant neoplasm of overlapping sites of right female breast: Secondary | ICD-10-CM | POA: Diagnosis not present

## 2018-11-19 DIAGNOSIS — C7951 Secondary malignant neoplasm of bone: Secondary | ICD-10-CM | POA: Diagnosis not present

## 2019-02-01 ENCOUNTER — Other Ambulatory Visit: Payer: Self-pay | Admitting: Obstetrics & Gynecology

## 2019-02-09 ENCOUNTER — Other Ambulatory Visit: Payer: Self-pay | Admitting: Hematology and Oncology

## 2019-02-10 ENCOUNTER — Telehealth: Payer: Self-pay | Admitting: Hematology and Oncology

## 2019-02-10 NOTE — Telephone Encounter (Signed)
Scheduled appt per 3/12 sch message - pt aware of appt date and time   

## 2019-02-23 ENCOUNTER — Inpatient Hospital Stay: Payer: 59 | Attending: Hematology and Oncology | Admitting: Hematology and Oncology

## 2019-02-23 ENCOUNTER — Ambulatory Visit: Payer: 59 | Admitting: Hematology and Oncology

## 2019-02-23 DIAGNOSIS — C50411 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 female breast: Secondary | ICD-10-CM | POA: Diagnosis not present

## 2019-02-23 DIAGNOSIS — Z923 Personal history of irradiation: Secondary | ICD-10-CM | POA: Diagnosis not present

## 2019-02-23 DIAGNOSIS — Z9221 Personal history of antineoplastic chemotherapy: Secondary | ICD-10-CM

## 2019-02-23 DIAGNOSIS — Z170 Estrogen receptor positive status [ER+]: Secondary | ICD-10-CM

## 2019-02-23 NOTE — Progress Notes (Signed)
HEMATOLOGY-ONCOLOGY TELEPHONE VISIT PROGRESS NOTE  I connected with [name] on 02/23/19 at 10:30 AM EDT by telephone and verified that I am speaking with the correct person using two identifiers.  I discussed the limitations, risks, security and privacy concerns of performing an evaluation and management service by telephone and the availability of in person appointments.  I also discussed with the patient that there may be a patient responsible charge related to this service. The patient expressed understanding and agreed to proceed.   History of Present Illness: Annual follow-up of metastatic breast cancer, breast cancer care receiving at [place] of upper-outer quadrant of right female breast ([place])   05/01/2000 Initial Biopsy    Right breast invasive ductal carcinoma stage II treated with lumpectomy followed by chemotherapy Adriamycin and Taxotere on CALGB 56812 study followed by 5 years of tamoxifen    05/31/2009 Relapse/Recurrence    Right breast recurrent cancer: 1.6 cm with LVI Triple negative status post mastectomy followed by adjuvant chemotherapy Taxol and carboplatin completed December 2010    03/31/2012 Relapse/Recurrence    Chest wall recurrence diagnosed at [facility] ER/PR positive status post right axillary lymph node dissection for lymph nodes negative: 3.1 cm adenocarcinoma with perineural invasion, could be soft tissue met versus lymph node    06/10/2012 - 09/23/2012 Chemotherapy    CMF chemotherapy x6 cycles    07/15/2012 - 08/19/2012 Radiation Therapy    Concurrent with radiation patient received Cytoxan and 5-FU cycles 2 to 4    09/30/2012 -  Anti-estrogen oral therapy    Tamoxifen 20 mg daily switched to Aromasin and then switched to anastrozole January 2017     Surgery    Bilateral salpingo-oophorectomy    04/08/2017 Relapse/Recurrence    Palpable lump right chest wall 1.8 cm by ultrasound: Invasive ductal carcinoma with DCIS grade  2-3 with suspicion for lymphovascular invasion. ER 80%, PR 15%, HER-2 negative ratio 1.32, Ki-67 20%    04/24/2017 PET scan    Small right chest wall nodule is hypermetabolic and suggestive of recurrent chest wall tumor, hypermetabolic lytic right acetabular lesion concerning for skeletal metastases     05/2017 -  Anti-estrogen oral therapy    anastrozole 05/2017, and palbociclib in 08/2017, and denosumab 07/2017    05/2017 Pathology Results    Surgical excision with reported positive margins. PET scan revealed met right acetabulum, confirmed by biopsy 05/2017    10/26/2017 PET scan    Further decrease in avidity right acetabular osseous lesion, now similar to surrounding bone. No definitive evidence of recurrent or new metastatic disease.- Interval resolution of the hypermetabolic activity associated with the skin thickening in the lateral aspect of the right chest wall     Miscellaneous    Genetics: Her testing was BRCA1/BRCA2 with BART, and was negative on NCGENES and Clinical Genetics felt no additional testing warranted    11/15/2018 - 11/19/2018 Radiation Therapy    Palliative radiation to the right acetabulum 30 Gy in 5 fractions at [facility]     Observations/Objective: Moderate discomfort in the right hip which has slightly improved after radiation to the right acetabulum in December 2019.  Continues to have moderate discomfort.  She is scheduled for another PET CT scan in April 2019.  Previous PET CT scans at [facility] did not show any progression of disease.  She continued to have hypermetabolic activity in the acetabulum.    Assessment Plan:  Breast cancer of upper-outer quadrant of  right female breast ([place]) Right breast invasive ductal carcinoma stage II diagnosed 05/01/2000 treated with lumpectomy and adjuvant chemotherapy CALGB 94854 study followed by 5 years of tamoxifen Recurrent right breast cancer 05/31/2009 status post mastectomy followed by adjuvant chemotherapy with Taxol  and carboplatin Recurrent breast cancer with chest wall recurrence diagnosed at [facility] status post resection and axillary lymph node dissection followed by CMF chemotherapy followed by radiation  Prior treatment: Antiestrogen therapy with tamoxifen started 09/30/2012 status post bilateral salpingo-oophorectomy started Femara in January 2016 could not tolerate it switched to Aromasin 02/01/2015, could not tolerate anastrozole either. Switched to Tamoxifen 20 daily 04/09/16  Relapse: 04/08/2017 Palpable lump right chest wall 1.8 cm by ultrasound: Invasive ductal carcinoma with DCIS grade 2-3 with suspicion for lymphovascular invasion. ER 80%, PR 15%, HER-2 negative ratio 1.32, Ki-67 20%  July 2018 at [facility]: Surgical excision with reported positive margins. PET scan revealed met right acetabulum, confirmed by biopsy 05/2017  Current treatment: Anastrozole 05/2017, and palbociclib in 08/2017, and denosumab 07/2017 (patient had allergy to Zometa) PET CT scan at [facility]: Solitary focus of hypermetabolic metastatic disease in the acetabulum. Palliative radiation therapy to the right acetabulum December 2019: 30 Gy in 5 fractions Next PET CT scan scheduled for March 05, 2019.  I offered her assistance or help locally if she needs it. We will set her up for annual follow-up in 1 year.    I discussed the assessment and treatment plan with the patient. The patient was provided an opportunity to ask questions and all were answered. The patient agreed with the plan and demonstrated an understanding of the instructions. The patient was advised to call back or seek an in-person evaluation if the symptoms worsen or if the condition fails to improve as anticipated.   I provided 15 minutes of non-face-to-face time during this encounter. Harriette Ohara, MD

## 2019-02-23 NOTE — Progress Notes (Signed)
HEMATOLOGY-ONCOLOGY TELEPHONE VISIT PROGRESS NOTE  I connected with [name] on 02/23/2019 at 10:30 AM EDT by telephone and verified that I am speaking with the correct person using two identifiers.  I discussed the limitations, risks, security and privacy concerns of performing an evaluation and management service by telephone and the availability of in person appointments.  I also discussed with the patient that there may be a patient responsible charge related to this service. The patient expressed understanding and agreed to proceed.   History of Present Illness: Breanna Lara is a 48 year old with above-mentioned history metastatic breast cancer who underwent palliative treatment with Ibrance and letrozole along with Xgeva for her bones at [facility] under the care of Dr. Janan Halter. I last saw her over a year ago.     Breast cancer of upper-outer quadrant of right female breast ([place])   05/01/2000 Initial Biopsy    Right breast invasive ductal carcinoma stage II treated with lumpectomy followed by chemotherapy Adriamycin and Taxotere on CALGB 95188 study followed by 5 years of tamoxifen    05/31/2009 Relapse/Recurrence    Right breast recurrent cancer: 1.6 cm with LVI Triple negative status post mastectomy followed by adjuvant chemotherapy Taxol and carboplatin completed December 2010    03/31/2012 Relapse/Recurrence    Chest wall recurrence diagnosed at [facility] ER/PR positive status post right axillary lymph node dissection for lymph nodes negative: 3.1 cm adenocarcinoma with perineural invasion, could be soft tissue met versus lymph node    06/10/2012 - 09/23/2012 Chemotherapy    CMF chemotherapy x6 cycles    07/15/2012 - 08/19/2012 Radiation Therapy    Concurrent with radiation patient received Cytoxan and 5-FU cycles 2 to 4    09/30/2012 -  Anti-estrogen oral therapy    Tamoxifen 20 mg daily switched to Aromasin and then switched to anastrozole January 2017     Surgery   Bilateral salpingo-oophorectomy    04/08/2017 Relapse/Recurrence    Palpable lump right chest wall 1.8 cm by ultrasound: Invasive ductal carcinoma with DCIS grade 2-3 with suspicion for lymphovascular invasion. ER 80%, PR 15%, HER-2 negative ratio 1.32, Ki-67 20%    04/24/2017 PET scan    Small right chest wall nodule is hypermetabolic and suggestive of recurrent chest wall tumor, hypermetabolic lytic right acetabular lesion concerning for skeletal metastases     05/2017 -  Anti-estrogen oral therapy    anastrozole 05/2017, and palbociclib in 08/2017, and denosumab 07/2017    05/2017 Pathology Results    Surgical excision with reported positive margins. PET scan revealed met right acetabulum, confirmed by biopsy 05/2017    10/26/2017 PET scan    Further decrease in avidity right acetabular osseous lesion, now similar to surrounding bone. No definitive evidence of recurrent or new metastatic disease.- Interval resolution of the hypermetabolic activity associated with the skin thickening in the lateral aspect of the right chest wall     Miscellaneous    Genetics: Her testing was BRCA1/BRCA2 with BART, and was negative on NCGENES and Clinical Genetics felt no additional testing warranted    11/15/2018 - 11/19/2018 Radiation Therapy    Palliative radiation to the right acetabulum 30 Gy in 5 fractions at [facility]     Observations/Objective: Right acetabular pain Continuing breast cancer care at [facility].   Assessment Plan:  Breast cancer of upper-outer quadrant of right female breast ([place]) Right breast invasive ductal carcinoma stage II diagnosed 05/01/2000 treated with lumpectomy and adjuvant chemotherapy CALGB 41660 study followed by  5 years of tamoxifen Recurrent right breast cancer 05/31/2009 status post mastectomy followed by adjuvant chemotherapy with Taxol and carboplatin Recurrent breast cancer with chest wall recurrence diagnosed at [facility] status post resection and axillary  lymph node dissection followed by CMF chemotherapy followed by radiation  Prior treatment: Antiestrogen therapy with tamoxifen started 09/30/2012 status post bilateral salpingo-oophorectomy started Femara in January 2016 could not tolerate it switched to Aromasin 02/01/2015, could not tolerate anastrozole either. Switched to Tamoxifen 20 daily 04/09/16  Relapse: 04/08/2017 Palpable lump right chest wall 1.8 cm by ultrasound: Invasive ductal carcinoma with DCIS grade 2-3 with suspicion for lymphovascular invasion. ER 80%, PR 15%, HER-2 negative ratio 1.32, Ki-67 20%  July 2018 at [facility]: Surgical excision with reported positive margins. PET scan revealed met right acetabulum, confirmed by biopsy 05/2017  Current treatment: Anastrozole 05/2017, and palbociclib in 08/2017, and denosumab 07/2017 (patient had allergy to Zometa) PET CT scan at [facility]: Solitary focus of hypermetabolic metastatic disease in the acetabulum. Palliative radiation therapy to the right acetabulum December 2019: 30 Gy in 5 fractions Next PET CT scan scheduled for March 05, 2019.  I offered her assistance or help locally if she needs it. We will set her up for annual follow-up in 1 year.      I discussed the assessment and treatment plan with the patient. The patient was provided an opportunity to ask questions and all were answered. The patient agreed with the plan and demonstrated an understanding of the instructions. The patient was advised to call back or seek an in-person evaluation if the symptoms worsen or if the condition fails to improve as anticipated.   I provided 15 minutes of non-face-to-face time during this encounter. Rulon Eisenmenger, MD

## 2019-02-23 NOTE — Assessment & Plan Note (Signed)
Right breast invasive ductal carcinoma stage II diagnosed 05/01/2000 treated with lumpectomy and adjuvant chemotherapy CALGB 93112 study followed by 5 years of tamoxifen Recurrent right breast cancer 05/31/2009 status post mastectomy followed by adjuvant chemotherapy with Taxol and carboplatin Recurrent breast cancer with chest wall recurrence diagnosed at [facility] status post resection and axillary lymph node dissection followed by CMF chemotherapy followed by radiation  Prior treatment: Antiestrogen therapy with tamoxifen started 09/30/2012 status post bilateral salpingo-oophorectomy started Femara in January 2016 could not tolerate it switched to Aromasin 02/01/2015, could not tolerate anastrozole either. Switched to Tamoxifen 20 daily 04/09/16  Relapse: 04/08/2017 Palpable lump right chest wall 1.8 cm by ultrasound: Invasive ductal carcinoma with DCIS grade 2-3 with suspicion for lymphovascular invasion. ER 80%, PR 15%, HER-2 negative ratio 1.32, Ki-67 20%  July 2018 at [facility]: Surgical excision with reported positive margins. PET scan revealed met right acetabulum, confirmed by biopsy 05/2017  Current treatment: Anastrozole 05/2017, and palbociclib in 08/2017, and denosumab 07/2017 (patient had allergy to Zometa) PET CT scan at [facility]: Solitary focus of hypermetabolic metastatic disease in the acetabulum. Palliative radiation therapy to the right acetabulum December 2019: 30 Gy in 5 fractions Next PET CT scan scheduled for March 05, 2019.  I offered her assistance or help locally if she needs it. We will set her up for annual follow-up in 1 year.

## 2019-02-25 ENCOUNTER — Telehealth: Payer: Self-pay | Admitting: Hematology and Oncology

## 2019-02-25 ENCOUNTER — Other Ambulatory Visit: Payer: Self-pay | Admitting: Hematology and Oncology

## 2019-02-25 NOTE — Telephone Encounter (Signed)
Scheduled appt per 3/25 sch message - f/u in one year - reminder letter sent in the mail;

## 2019-04-24 ENCOUNTER — Other Ambulatory Visit: Payer: Self-pay | Admitting: Hematology and Oncology

## 2019-04-26 ENCOUNTER — Ambulatory Visit: Payer: 59 | Admitting: Hematology and Oncology

## 2020-02-23 NOTE — Progress Notes (Addendum)
I connected with the patient through the MyChart video application.  Verified patient identifiers and proceeded with the appointment. Patient Care Team: Jonathon Jordan, MD as PCP - General (Family Medicine)  DIAGNOSIS:    ICD-10-CM   1. 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 breast in female, estrogen receptor positive ([name])  C50.411    Z17.0     SUMMARY OF ONCOLOGIC HISTORY: Oncology History  Breast cancer of upper-outer quadrant of right female breast ([name])  05/01/2000 Initial Biopsy   Right breast invasive ductal carcinoma stage II treated with lumpectomy followed by chemotherapy Adriamycin and Taxotere on CALGB 45625 study followed by 5 years of tamoxifen   05/31/2009 Relapse/Recurrence   Right breast recurrent cancer: 1.6 cm with LVI Triple negative status post mastectomy followed by adjuvant chemotherapy Taxol and carboplatin completed December 2010   03/31/2012 Relapse/Recurrence   Chest wall recurrence diagnosed at [name] ER/PR positive status post right axillary lymph node dissection for lymph nodes negative: 3.1 cm adenocarcinoma with perineural invasion, could be soft tissue met versus lymph node   06/10/2012 - 09/23/2012 Chemotherapy   CMF chemotherapy x6 cycles   07/15/2012 - 08/19/2012 Radiation Therapy   Concurrent with radiation patient received Cytoxan and 5-FU cycles 2 to 4   09/30/2012 -  Anti-estrogen oral therapy   Tamoxifen 20 mg daily switched to Aromasin and then switched to anastrozole January 2017    Surgery   Bilateral salpingo-oophorectomy   04/08/2017 Relapse/Recurrence   Palpable lump right chest wall 1.8 cm by ultrasound: Invasive ductal carcinoma with DCIS grade 2-3 with suspicion for lymphovascular invasion. ER 80%, PR 15%, HER-2 negative ratio 1.32, Ki-67 20%   04/24/2017 PET scan   Small right chest wall nodule is hypermetabolic and suggestive of recurrent chest wall tumor, hypermetabolic lytic right acetabular lesion concerning  for skeletal metastases    05/2017 -  Anti-estrogen oral therapy   anastrozole 05/2017, and palbociclib in 08/2017, and denosumab 07/2017   05/2017 Pathology Results   Surgical excision with reported positive margins. PET scan revealed met right acetabulum, confirmed by biopsy 05/2017   10/26/2017 PET scan   Further decrease in avidity right acetabular osseous lesion, now similar to surrounding bone. No definitive evidence of recurrent or new metastatic disease.- Interval resolution of the hypermetabolic activity associated with the skin thickening in the lateral aspect of the right chest wall    Miscellaneous   Genetics: Her testing was BRCA1/BRCA2 with [facility], and was negative on NCGENES and [employer]etics felt no additional testing warranted   11/15/2018 - 11/19/2018 Radiation Therapy   Palliative radiation to the right acetabulum 30 Gy in 5 fractions at [name] COMPLIANT: Follow-up of metastatic breast cancer  INTERVAL HISTORY: Breanna Lara is a 49 y.o. with above-mentioned history of metastatic breast cancer who is currently on palliative treatment with Ibrance and letrozole along with Delton See at [name] under the care of Dr. Janan Halter. I last saw her a year ago. She presents to the clinic today for follow-up.  It appears that the patient had a PET CT scan recently which showed progression of disease.  She was apparently on a clinical trial starting in January 2021 on fulvestrant and palbociclib and she decided to discontinue the trial.  ALLERGIES:  is allergic to percocet [oxycodone-acetaminophen].  MEDICATIONS:  Current Outpatient Medications  Medication Sig Dispense Refill  . anastrozole (ARIMIDEX) 1 MG tablet Take 1 tablet (1 mg total) by mouth daily. 30 tablet 0  .  anastrozole (ARIMIDEX) 1 MG tablet TAKE 1 TABLET BY MOUTH  DAILY 90 tablet 3  . anastrozole (ARIMIDEX) 1 MG tablet TAKE 1 TABLET BY MOUTH  DAILY 90 tablet 0  . anastrozole (ARIMIDEX) 1 MG tablet TAKE 1 TABLET BY  MOUTH  DAILY 90 tablet 3  . Black Pepper-Turmeric 3-500 MG CAPS Take 1 tablet by mouth daily.    . calcium carbonate (OS-CAL) 600 MG TABS Take 1 tablet (600 mg total) by mouth 2 (two) times daily with a meal. 60 tablet 12  . cholecalciferol (VITAMIN D) 1000 UNITS tablet Take 1,000 Units by mouth daily.      . fexofenadine (ALLEGRA) 30 MG tablet Take 30 mg by mouth as needed.    Marland Kitchen HYDROcodone-acetaminophen (NORCO/VICODIN) 5-325 MG tablet Take 1 tablet by mouth every 4 (four) hours as needed. 20 tablet 0  . Misc Natural Products (OSTEO BI-FLEX TRIPLE STRENGTH PO) Take by mouth.    . Probiotic Product (PROBIOTIC DAILY PO) Take 1 tablet by mouth daily.    Marland Kitchen PROCTOFOAM HC rectal foam Place 1 applicator rectally 2 (two) times daily. 10 g 0  . Turmeric Curcumin 500 MG CAPS Take by mouth.     No current facility-administered medications for this visit.    PHYSICAL EXAMINATION: ECOG PERFORMANCE STATUS: 1 - Symptomatic but completely ambulatory  There were no vitals filed for this visit. There were no vitals filed for this visit.  LABORATORY DATA:  I have reviewed the data as listed CMP Latest Ref Rng & Units 12/19/2015 02/01/2015 10/31/2014  Glucose 70 - 140 mg/dl 94 107 96  BUN 7.0 - 26.0 mg/dL 12.9 11.2 11.4  Creatinine 0.6 - 1.1 mg/dL 0.9 1.0 0.9  Sodium 136 - 145 mEq/L 142 142 142  Potassium 3.5 - 5.1 mEq/L 3.9 3.8 4.0  Chloride 98 - 107 mEq/L - - -  CO2 22 - 29 mEq/L '28 29 29  '$ Calcium 8.4 - 10.4 mg/dL 9.3 9.1 9.3  Total Protein 6.4 - 8.3 g/dL 7.6 7.2 7.4  Total Bilirubin 0.20 - 1.20 mg/dL 0.50 0.39 0.30  Alkaline Phos 40 - 150 U/L 67 69 67  AST 5 - 34 U/L '22 27 26  '$ ALT 0 - 55 U/L '26 25 26    '$ Lab Results  Component Value Date   WBC 7.2 12/19/2015   HGB 12.1 12/19/2015   HCT 37.2 12/19/2015   MCV 87.1 12/19/2015   PLT 267 12/19/2015   NEUTROABS 4.5 12/19/2015    ASSESSMENT & PLAN:  Breast cancer of upper-outer quadrant of right female breast ([place]) Right breast invasive ductal  carcinoma stage II diagnosed 05/01/2000 treated with lumpectomy and adjuvant chemotherapy CALGB 32992 study followed by 5 years of tamoxifen Recurrent right breast cancer 05/31/2009 status post mastectomy followed by adjuvant chemotherapy with Taxol and carboplatin Recurrent breast cancer with chest wall recurrence diagnosed at [facility] status post resection and axillary lymph node dissection followed by CMF chemotherapy followed by radiation  Prior treatment: Antiestrogen therapy with tamoxifen started 09/30/2012 status post bilateral salpingo-oophorectomy started Femara in January 2016 could not tolerate it switched to Aromasin 02/01/2015, could not tolerate anastrozole either. Switched to Tamoxifen 20 daily 04/09/16  Relapse: 04/08/2017 Palpable lump right chest wall 1.8 cm by ultrasound: Invasive ductal carcinoma with DCIS grade 2-3 with suspicion for lymphovascular invasion. ER 80%, PR 15%, HER-2 negative ratio 1.32, Ki-67 20%  July 2018 at [facility]: Surgical excision with reported positive margins. PET scan revealed met right acetabulum, confirmed by biopsy  05/2017  Current treatment: 1. Anastrozole 05/2017, and palbociclib in 08/2017, and denosumab 07/2017 (patient had allergy to Zometa) PET CT scan at [facility]: Solitary focus of hypermetabolic metastatic disease in the acetabulum. 2. Palliative radiation therapy to the right acetabulum December 2019: 30 Gy in 5 fractions 3. Enrolled in PACE ARM B Fulvestrant + Palbociclib (Started 11/30/2019) -- C1D15; fulvestrant held due to injection site reaction and various GI/MSK sxs -- 12/23/19 patient has decided to disenroll from the PACE trial    PET/CT 02/23/2020: Interval increase in osseous FDG uptake involving the T5 posterior elements, right ischium, and left proximal femur. New FDG uptake in the left ilium and anterior acetabulum and right inferior pubic ramus. These are concerning for metastatic disease progression.   Patient is currently  off all treatment.  I recommended that she discuss with Dr. Janan Halter about starting back on fulvestrant along with aromatase inhibitor and even adding palbociclib back again.  She understands fully well that this is not following the clinical trial data exactly but she is she tolerates it so well and the metastatic sites are only few in number, it is worth considering before switching her to exemestane and everolimus. The final decision of course belongs to Dr. Jeani Hawking and herself.  Patient follows with Dr. Janan Halter at [facility]    No orders of the defined types were placed in this encounter.  The patient has a good understanding of the overall plan. she agrees with it. she will call with any problems that may develop before the next visit here.  Total time spent: 30 mins including face to face time and time spent for planning, charting and coordination of care  Nicholas Lose, MD 02/24/2020  I, Cloyde Reams Dorshimer, am acting as scribe for Dr. Nicholas Lose.  I have reviewed the above documentation for accuracy and completeness, and I agree with the above.

## 2020-02-24 ENCOUNTER — Inpatient Hospital Stay: Payer: 59 | Attending: Hematology and Oncology | Admitting: Hematology and Oncology

## 2020-02-24 DIAGNOSIS — Z170 Estrogen receptor positive status [ER+]: Secondary | ICD-10-CM | POA: Insufficient documentation

## 2020-02-24 DIAGNOSIS — Z9221 Personal history of antineoplastic chemotherapy: Secondary | ICD-10-CM | POA: Insufficient documentation

## 2020-02-24 DIAGNOSIS — C50411 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 female breast: Secondary | ICD-10-CM | POA: Diagnosis not present

## 2020-02-24 DIAGNOSIS — Z923 Personal history of irradiation: Secondary | ICD-10-CM | POA: Insufficient documentation

## 2020-02-24 DIAGNOSIS — C7951 Secondary malignant neoplasm of bone: Secondary | ICD-10-CM | POA: Insufficient documentation

## 2020-02-24 NOTE — Assessment & Plan Note (Signed)
Right breast invasive ductal carcinoma stage II diagnosed 05/01/2000 treated with lumpectomy and adjuvant chemotherapy CALGB 83374 study followed by 5 years of tamoxifen Recurrent right breast cancer 05/31/2009 status post mastectomy followed by adjuvant chemotherapy with Taxol and carboplatin Recurrent breast cancer with chest wall recurrence diagnosed at [facility] status post resection and axillary lymph node dissection followed by CMF chemotherapy followed by radiation  Prior treatment: Antiestrogen therapy with tamoxifen started 09/30/2012 status post bilateral salpingo-oophorectomy started Femara in January 2016 could not tolerate it switched to Aromasin 02/01/2015, could not tolerate anastrozole either. Switched to Tamoxifen 20 daily 04/09/16  Relapse: 04/08/2017 Palpable lump right chest wall 1.8 cm by ultrasound: Invasive ductal carcinoma with DCIS grade 2-3 with suspicion for lymphovascular invasion. ER 80%, PR 15%, HER-2 negative ratio 1.32, Ki-67 20%  July 2018 at [facility]: Surgical excision with reported positive margins. PET scan revealed met right acetabulum, confirmed by biopsy 05/2017  Current treatment: 1. Anastrozole 05/2017, and palbociclib in 08/2017, and denosumab 07/2017 (patient had allergy to Zometa) PET CT scan at [facility]: Solitary focus of hypermetabolic metastatic disease in the acetabulum. 2. Palliative radiation therapy to the right acetabulum December 2019: 30 Gy in 5 fractions 3. Enrolled in PACE ARM B Fulvestrant + Palbociclib (Started 11/30/2019) -- C1D15; fulvestrant held due to injection site reaction and various GI/MSK sxs -- 12/23/19 patient has decided to disenroll from the PACE trial    PET/CT 02/23/2020: Interval increase in osseous FDG uptake involving the T5 posterior elements, right ischium, and left proximal femur. New FDG uptake in the left ilium and anterior acetabulum and right inferior pubic ramus. These are concerning for metastatic disease  progression.   Patient follows with Dr. Janan Halter at [facility]

## 2020-05-07 ENCOUNTER — Ambulatory Visit (INDEPENDENT_AMBULATORY_CARE_PROVIDER_SITE_OTHER): Payer: 59 | Admitting: Obstetrics & Gynecology

## 2020-05-07 ENCOUNTER — Other Ambulatory Visit: Payer: Self-pay

## 2020-05-07 ENCOUNTER — Encounter: Payer: Self-pay | Admitting: Obstetrics & Gynecology

## 2020-05-07 ENCOUNTER — Other Ambulatory Visit (HOSPITAL_COMMUNITY)
Admission: RE | Admit: 2020-05-07 | Discharge: 2020-05-07 | Disposition: A | Discharge status: Home / Self Care | Payer: 59 | Source: Ambulatory Visit | Attending: Obstetrics & Gynecology | Admitting: Obstetrics & Gynecology

## 2020-05-07 VITALS — BP 132/82 | HR 84 | Resp 16 | Ht 66.0 in | Wt 179.0 lb

## 2020-05-07 DIAGNOSIS — Z1211 Encounter for screening for malignant neoplasm of colon: Secondary | ICD-10-CM

## 2020-05-07 DIAGNOSIS — Z01419 Encounter for gynecological examination (general) (routine) without abnormal findings: Secondary | ICD-10-CM | POA: Insufficient documentation

## 2020-05-07 DIAGNOSIS — Z1272 Encounter for screening for malignant neoplasm of vagina: Secondary | ICD-10-CM

## 2020-05-07 NOTE — Progress Notes (Signed)
Subjective:     Breanna Lara is a 49 y.o. female here for a routine exam.  Current complaints: mild vaginal dryness.  Undergoing treatment for breast cancer mets to bone.  Stable.   Monitored by [facility] Gets Pet scans q4 months   Gynecologic History No LMP recorded. (Menstrual status: Other). Contraception: post menopausal status Last Pap: 2017. Results were: normal  Obstetric History OB History  Gravida Para Term Preterm AB Living  4 2     2 2   SAB TAB Ectopic Multiple Live Births    1 1        # Outcome Date GA Lbr Len/2nd Weight Sex Delivery Anes PTL Lv  4 Para 07/10/09    F      3 TAB 10/31/02          2 Ectopic 12/01/97          1 Para 08/30/95    F         The following portions of the patient's history were reviewed and updated as appropriate: allergies, current medications, past family history, past medical history, past social history, past surgical history and problem list.  Review of Systems Pertinent items noted in HPI and remainder of comprehensive ROS otherwise negative.    Objective:      Vitals:   05/07/20 1419  BP: 132/82  Pulse: 84  Resp: 16  Weight: 179 lb (81.2 kg)  Height: 5\' 6"  (1.676 m)   Vitals:  WNL General appearance: alert, cooperative and no distress  HEENT: Normocephalic, without obvious abnormality, atraumatic Eyes: negative Throat: lips, mucosa, and tongue normal; teeth and gums normal  Respiratory: Clear to auscultation bilaterally  CV: Regular rate and rhythm  Breasts:  Right surgically absent.  Left breast normal.  GI: Soft, non-tender; bowel sounds normal; no masses,  no organomegaly  GU: External Genitalia:  Tanner V, no lesion Urethra:  No prolapse   Vagina: Pink, normal rugae, no blood or discharge  Cervix: No CMT, no lesion  Uterus:  Non tender, mobile  Adnexa: Non tender  Musculoskeletal: No edema, redness or tenderness in the calves or thighs  Skin: No lesions or rash  Lymphatic: Axillary adenopathy: none      Psychiatric: Normal mood and behavior        Assessment:    Healthy female exam.    Plan:    1.  Pap with co-testing 2.  Has received Covid 19 vaccination 3.  Referral to GI for screening colonoscopy.

## 2020-05-09 LAB — CYTOLOGY - PAP
Comment: NEGATIVE
Diagnosis: NEGATIVE
High risk HPV: NEGATIVE

## 2020-07-23 ENCOUNTER — Encounter: Payer: Self-pay | Admitting: Gastroenterology

## 2020-09-10 ENCOUNTER — Encounter: Payer: Self-pay | Admitting: Gastroenterology

## 2020-09-10 ENCOUNTER — Other Ambulatory Visit: Payer: Self-pay

## 2020-09-10 ENCOUNTER — Ambulatory Visit (AMBULATORY_SURGERY_CENTER): Payer: Self-pay

## 2020-09-10 VITALS — Ht 66.5 in | Wt 176.0 lb

## 2020-09-10 DIAGNOSIS — Z1211 Encounter for screening for malignant neoplasm of colon: Secondary | ICD-10-CM

## 2020-09-10 MED ORDER — SUTAB 1479-225-188 MG PO TABS: 1.0000 | ORAL_TABLET | ORAL | 0 refills | Status: DC

## 2020-09-10 NOTE — Progress Notes (Signed)
No egg or soy allergy known to patient  No issues with past sedation with any surgeries or procedures No intubation problems in the past  No FH of Malignant Hyperthermia No diet pills per patient No home 02 use per patient  No blood thinners per patient  Pt denies issues with constipation  No A fib or A flutter  EMMI video to pt or via MyChart  COVID 19 guidelines implemented in PV today with Pt and RN  Coupon given to pt in PV today , Code to Pharmacy  COVID vaccines completed on 01/2020 per pt;  Due to the COVID-19 pandemic we are asking patients to follow these guidelines. Please only bring one care partner. Please be aware that your care partner may wait in the car in the parking lot or if they feel like they will be too hot to wait in the car, they may wait in the lobby on the 4th floor. All care partners are required to wear a mask the entire time (we do not have any that we can provide them), they need to practice social distancing, and we will do a Covid check for all patient's and care partners when you arrive. Also we will check their temperature and your temperature. If the care partner waits in their car they need to stay in the parking lot the entire time and we will call them on their cell phone when the patient is ready for discharge so they can bring the car to the front of the building. Also all patient's will need to wear a mask into building. 

## 2020-09-24 ENCOUNTER — Ambulatory Visit (AMBULATORY_SURGERY_CENTER): Payer: 59 | Admitting: Gastroenterology

## 2020-09-24 ENCOUNTER — Encounter: Payer: Self-pay | Admitting: Gastroenterology

## 2020-09-24 ENCOUNTER — Other Ambulatory Visit: Payer: Self-pay

## 2020-09-24 VITALS — BP 139/82 | HR 71 | Temp 97.1°F | Resp 9 | Ht 66.0 in | Wt 176.0 lb

## 2020-09-24 DIAGNOSIS — Z1211 Encounter for screening for malignant neoplasm of colon: Secondary | ICD-10-CM

## 2020-09-24 DIAGNOSIS — K635 Polyp of colon: Secondary | ICD-10-CM

## 2020-09-24 DIAGNOSIS — D123 Benign neoplasm of transverse colon: Secondary | ICD-10-CM

## 2020-09-24 MED ORDER — SODIUM CHLORIDE 0.9 % IV SOLN: 500.0000 mL | Freq: Once | INTRAVENOUS | Status: DC

## 2020-09-24 NOTE — Progress Notes (Signed)
PT taken to PACU. Monitors in place. VSS. Report given to RN. 

## 2020-09-24 NOTE — Progress Notes (Signed)
Called to room to assist during endoscopic procedure.  Patient ID and intended procedure confirmed with present staff. Received instructions for my participation in the procedure from the performing physician.  

## 2020-09-24 NOTE — Patient Instructions (Signed)
Please, read all of the handouts given to you by your recovery room nurse.  Thank-you for choosing us for your healthcare needs today.  YOU HAD AN ENDOSCOPIC PROCEDURE TODAY AT THE [facility]:   Refer to the procedure report that was given to you for any specific questions about what was found during the examination.  If the procedure report does not answer your questions, please call your gastroenterologist to clarify.  If you requested that your care partner not be given the details of your procedure findings, then the procedure report has been included in a sealed envelope for you to review at your convenience later.  YOU SHOULD EXPECT: Some feelings of bloating in the abdomen. Passage of more gas than usual.  Walking can help get rid of the air that was put into your GI tract during the procedure and reduce the bloating. If you had a lower endoscopy (such as a colonoscopy or flexible sigmoidoscopy) you may notice spotting of blood in your stool or on the toilet paper. If you underwent a bowel prep for your procedure, you may not have a normal bowel movement for a few days.  Please Note:  You might notice some irritation and congestion in your nose or some drainage.  This is from the oxygen used during your procedure.  There is no need for concern and it should clear up in a day or so.  SYMPTOMS TO REPORT IMMEDIATELY:   Following lower endoscopy (colonoscopy or flexible sigmoidoscopy):  Excessive amounts of blood in the stool  Significant tenderness or worsening of abdominal pains  Swelling of the abdomen that is new, acute  Fever of 100F or higher   For urgent or emergent issues, a gastroenterologist can be reached at any hour by calling [phone]. Do not use MyChart messaging for urgent concerns.    DIET:  We do recommend a small meal at first, but then you may proceed to your regular diet.  Drink plenty of fluids but you should avoid alcoholic beverages for 24  hours.  ACTIVITY:  You should plan to take it easy for the rest of today and you should NOT DRIVE or use heavy machinery until tomorrow (because of the sedation medicines used during the test).    FOLLOW UP: Our staff will call the number listed on your records 48-72 hours following your procedure to check on you and address any questions or concerns that you may have regarding the information given to you following your procedure. If we do not reach you, we will leave a message.  We will attempt to reach you two times.  During this call, we will ask if you have developed any symptoms of COVID 19. If you develop any symptoms (ie: fever, flu-like symptoms, shortness of breath, cough etc.) before then, please call [phone].  If you test positive for Covid 19 in the 2 weeks post procedure, please call and report this information to us.    If any biopsies were taken you will be contacted by phone or by letter within the next 1-3 weeks.  Please call us at [phone] if you have not heard about the biopsies in 3 weeks.    SIGNATURES/CONFIDENTIALITY: You and/or your care partner have signed paperwork which will be entered into your electronic medical record.  These signatures attest to the fact that that the information above on your After Visit Summary has been reviewed and is understood.  Full responsibility of the confidentiality of this discharge   information lies with you and/or your care-partner. 

## 2020-09-24 NOTE — Progress Notes (Signed)
Vs by CW in adm   No sticks or bp on rt - sign on stretcher

## 2020-09-24 NOTE — Op Note (Signed)
[facility] Patient Name: Breanna Lara Procedure Date: 09/24/2020 1:21 PM MRN: [id number] Endoscopist: Remo Lipps P. Havery Moros , MD Age: 49 Referring MD:  Date of Birth: [birth year] Gender: Female Account #: [id number] Procedure:                Colonoscopy Indications:              Screening for colorectal malignant neoplasm, This                            is the patient's first colonoscopy Medicines:                Monitored Anesthesia Care Procedure:                Pre-Anesthesia Assessment:                           - Prior to the procedure, a History and Physical                            was performed, and patient medications and                            allergies were reviewed. The patient's tolerance of                            previous anesthesia was also reviewed. The risks                            and benefits of the procedure and the sedation                            options and risks were discussed with the patient.                            All questions were answered, and informed consent                            was obtained. Prior Anticoagulants: The patient has                            taken no previous anticoagulant or antiplatelet                            agents. ASA Grade Assessment: II - A patient with                            mild systemic disease. After reviewing the risks                            and benefits, the patient was deemed in                            satisfactory condition to undergo the procedure.  After obtaining informed consent, the colonoscope                            was passed under direct vision. Throughout the                            procedure, the patient's blood pressure, pulse, and                            oxygen saturations were monitored continuously. The                            Colonoscope was introduced through the anus and                            advanced to the the  terminal ileum, with                            identification of the appendiceal orifice and IC                            valve. The colonoscopy was performed without                            difficulty. The patient tolerated the procedure                            well. The quality of the bowel preparation was                            good. The terminal ileum, ileocecal valve,                            appendiceal orifice, and rectum were photographed. Scope In: 1:24:28 PM Scope Out: 1:50:22 PM Scope Withdrawal Time: 0 hours 21 minutes 39 seconds  Total Procedure Duration: 0 hours 25 minutes 54 seconds  Findings:                 The perianal and digital rectal examinations were                            normal.                           The terminal ileum appeared normal.                           A 3 to 4 mm polyp was found in the transverse                            colon. The polyp was flat. The polyp was removed                            with a cold biopsy forceps, as it could not be  grasped easily with stiff snare. Resection and                            retrieval were complete.                           Internal hemorrhoids were found during                            retroflexion. The hemorrhoids were small.                           The exam was otherwise without abnormality. Complications:            No immediate complications. Estimated blood loss:                            Minimal. Estimated Blood Loss:     Estimated blood loss was minimal. Impression:               - The examined portion of the ileum was normal.                           - One 3 to 4 mm polyp in the transverse colon,                            removed with a cold biopsy forceps. Resected and                            retrieved.                           - Internal hemorrhoids.                           - The examination was otherwise normal. Recommendation:            - Patient has a contact number available for                            emergencies. The signs and symptoms of potential                            delayed complications were discussed with the                            patient. Return to normal activities tomorrow.                            Written discharge instructions were provided to the                            patient.                           - Resume previous diet.                           -  Continue present medications.                           - Await pathology results. Remo Lipps P. Kerensa Nicklas, MD 09/24/2020 1:59:25 PM This report has been signed electronically.

## 2020-09-26 ENCOUNTER — Telehealth: Payer: Self-pay | Admitting: *Deleted

## 2020-09-26 NOTE — Telephone Encounter (Signed)
  Follow up Call-  Call back number 09/24/2020  Post procedure Call Back phone  # [phone]  Permission to leave phone message Yes  Some recent data might be hidden    Mailbox full

## 2021-03-26 ENCOUNTER — Telehealth: Payer: Self-pay | Admitting: *Deleted

## 2021-03-26 MED ORDER — FLUCONAZOLE 150 MG PO TABS: ORAL_TABLET | ORAL | 0 refills | Status: AC

## 2021-03-26 NOTE — Telephone Encounter (Signed)
Pt called requesting Diflucan.  She has an compromised immune system due to chemo.  She is experiencing vaginal itching with white thick d/c.  RX for Diflucan sent to her pharmacy.

## 2021-03-27 ENCOUNTER — Telehealth: Payer: Self-pay | Admitting: *Deleted

## 2021-03-27 MED ORDER — HYDROCORT-PRAMOXINE (PERIANAL) 1-1 % EX FOAM: 1.0000 | Freq: Two times a day (BID) | CUTANEOUS | 1 refills | Status: AC

## 2021-03-27 NOTE — Telephone Encounter (Signed)
Pt called requesting RF on Proctofoam HC.    RF sent to [facility].

## 2021-09-19 ENCOUNTER — Other Ambulatory Visit: Payer: Self-pay | Admitting: Podiatry

## 2021-09-19 DIAGNOSIS — R2242 Localized swelling, mass and lump, left lower limb: Secondary | ICD-10-CM

## 2021-09-19 DIAGNOSIS — M25572 Pain in left ankle and joints of left foot: Secondary | ICD-10-CM

## 2021-10-08 ENCOUNTER — Other Ambulatory Visit: Payer: Self-pay

## 2021-10-08 ENCOUNTER — Ambulatory Visit
Admission: RE | Admit: 2021-10-08 | Discharge: 2021-10-08 | Disposition: A | Discharge status: Home / Self Care | Payer: 59 | Source: Ambulatory Visit | Attending: Podiatry | Admitting: Podiatry

## 2021-10-08 DIAGNOSIS — M25572 Pain in left ankle and joints of left foot: Secondary | ICD-10-CM

## 2021-10-08 DIAGNOSIS — R2242 Localized swelling, mass and lump, left lower limb: Secondary | ICD-10-CM

## 2022-12-08 ENCOUNTER — Telehealth: Payer: Self-pay | Admitting: *Deleted

## 2022-12-08 NOTE — Telephone Encounter (Signed)
Left patient a message to call and schedule annual with Dr. Gala Romney in February.

## 2023-03-21 ENCOUNTER — Encounter (HOSPITAL_BASED_OUTPATIENT_CLINIC_OR_DEPARTMENT_OTHER): Payer: Self-pay | Admitting: Emergency Medicine

## 2023-03-21 ENCOUNTER — Emergency Department (HOSPITAL_BASED_OUTPATIENT_CLINIC_OR_DEPARTMENT_OTHER): Payer: 59

## 2023-03-21 ENCOUNTER — Other Ambulatory Visit: Payer: Self-pay

## 2023-03-21 ENCOUNTER — Emergency Department (HOSPITAL_BASED_OUTPATIENT_CLINIC_OR_DEPARTMENT_OTHER)
Admission: EM | Admit: 2023-03-21 | Discharge: 2023-03-21 | Disposition: A | Discharge status: Other Acute Inpatient Hospital | Payer: 59 | Attending: Emergency Medicine | Admitting: Emergency Medicine

## 2023-03-21 DIAGNOSIS — F419 Anxiety disorder, unspecified: Secondary | ICD-10-CM | POA: Insufficient documentation

## 2023-03-21 DIAGNOSIS — G9389 Other specified disorders of brain: Secondary | ICD-10-CM | POA: Insufficient documentation

## 2023-03-21 DIAGNOSIS — C50912 Malignant neoplasm of unspecified site of left female breast: Secondary | ICD-10-CM

## 2023-03-21 DIAGNOSIS — R200 Anesthesia of skin: Secondary | ICD-10-CM | POA: Diagnosis present

## 2023-03-21 LAB — COMPREHENSIVE METABOLIC PANEL
ALT: 19 U/L (ref 0–44)
AST: 46 U/L — ABNORMAL HIGH (ref 15–41)
Albumin: 3.8 g/dL (ref 3.5–5.0)
Alkaline Phosphatase: 128 U/L — ABNORMAL HIGH (ref 38–126)
Anion gap: 12 (ref 5–15)
BUN: 12 mg/dL (ref 6–20)
CO2: 28 mmol/L (ref 22–32)
Calcium: 9 mg/dL (ref 8.9–10.3)
Chloride: 95 mmol/L — ABNORMAL LOW (ref 98–111)
Creatinine, Ser: 0.7 mg/dL (ref 0.44–1.00)
GFR, Estimated: >60 mL/min (ref >60)
Glucose, Bld: 102 mg/dL — ABNORMAL HIGH (ref 70–99)
Potassium: 4.6 mmol/L (ref 3.5–5.1)
Sodium: 135 mmol/L (ref 135–145)
Total Bilirubin: 0.4 mg/dL (ref 0.3–1.2)
Total Protein: 7.8 g/dL (ref 6.5–8.1)

## 2023-03-21 LAB — CBC WITH DIFFERENTIAL/PLATELET
Abs Immature Granulocytes: 0.02 10*3/uL (ref 0.00–0.07)
Basophils Absolute: 0 10*3/uL (ref 0.0–0.1)
Basophils Relative: 0 %
Eosinophils Absolute: 0.1 10*3/uL (ref 0.0–0.5)
Eosinophils Relative: 2 %
HCT: 31.7 % — ABNORMAL LOW (ref 36.0–46.0)
Hemoglobin: 10.3 g/dL — ABNORMAL LOW (ref 12.0–15.0)
Immature Granulocytes: 0 %
Lymphocytes Relative: 26 %
Lymphs Abs: 2 10*3/uL (ref 0.7–4.0)
MCH: 29.8 pg (ref 26.0–34.0)
MCHC: 32.5 g/dL (ref 30.0–36.0)
MCV: 91.6 fL (ref 80.0–100.0)
Monocytes Absolute: 0.6 10*3/uL (ref 0.1–1.0)
Monocytes Relative: 8 %
Neutro Abs: 4.8 10*3/uL (ref 1.7–7.7)
Neutrophils Relative %: 64 %
Platelets: 333 10*3/uL (ref 150–400)
RBC: 3.46 MIL/uL — ABNORMAL LOW (ref 3.87–5.11)
RDW: 15.5 % (ref 11.5–15.5)
WBC: 7.5 10*3/uL (ref 4.0–10.5)
nRBC: 0 % (ref 0.0–0.2)

## 2023-03-21 LAB — CBG MONITORING, ED: Glucose-Capillary: 88 mg/dL (ref 70–99)

## 2023-03-21 MED ORDER — GADOBUTROL 1 MMOL/ML IV SOLN
7.5000 mL | Freq: Once | INTRAVENOUS | Status: AC | PRN
  Administered 2023-03-21: 7.5 mL via INTRAVENOUS

## 2023-03-21 MED ORDER — LORAZEPAM 2 MG/ML IJ SOLN
1.0000 mg | Freq: Once | INTRAMUSCULAR | Status: AC
  Administered 2023-03-21: 1 mg via INTRAVENOUS
  Filled 2023-03-21: qty 1

## 2023-03-21 MED ORDER — DEXAMETHASONE SODIUM PHOSPHATE 4 MG/ML IJ SOLN
4.0000 mg | Freq: Four times a day (QID) | INTRAMUSCULAR | Status: DC
  Administered 2023-03-21: 4 mg via INTRAVENOUS
  Filled 2023-03-21: qty 1

## 2023-03-21 NOTE — ED Provider Notes (Signed)
Received patient in turnover from Dr. Rubin Payor.  Please see their note for further details of Hx, PE.  Briefly patient is a 52 y.o. female with a Numbness .  Patient with weakness and numbness to the right hand and right side of her face.  Going on for about a week.  CT imaging concerning for new metastatic disease.  MRI consistent with the same, 2 separate lesions.  I discussed the case with [facility] oncology, Dr. Caswell Corwin.  Felt most reasonable for admission into their hospital system.  Recommended ED to ED transfer.  Recommended giving a dose of Decadron.  CRITICAL CARE Performed by: Rae Roam   Total critical care time: 35 minutes  Critical care time was exclusive of separately billable procedures and treating other patients.  Critical care was necessary to treat or prevent imminent or life-threatening deterioration.  Critical care was time spent personally by me on the following activities: development of treatment plan with patient and/or surrogate as well as nursing, discussions with consultants, evaluation of patient's response to treatment, examination of patient, obtaining history from patient or surrogate, ordering and performing treatments and interventions, ordering and review of laboratory studies, ordering and review of radiographic studies, pulse oximetry and re-evaluation of patient's condition.     Melene Plan, DO 03/21/23 1827

## 2023-03-21 NOTE — ED Provider Notes (Signed)
[facility] Provider Note   CSN: [id number] Arrival date & time: 03/21/23  1417     History  Chief Complaint  Patient presents with   Numbness    BRYLEE BERK is a 52 y.o. female.  HPI Patient presents with some difficulty moving her right hand.  Worse today but states for around the last week she has been having some difficulty writing.  Also has tingling in her right face.  States has had pain in her lower extremities but has a history of metastatic breast cancer.  Stage IV cancer.  Is on oral chemo at this time.  Reportedly does have some difficulty speaking earlier but that is cleared up.  Patient states she was getting anxious about the numbness.  Has not had her head imaged.  Has had a mild headache recently but did not think anything of it.  Gets treated at [facility].   Past Medical History:  Diagnosis Date   Allergy    seasonal allergies   Anemia    H/O   Anxiety    Arthritis    neck   ASCUS (atypical squamous cells of undetermined significance) on Pap smear 2009   colpo   Blood transfusion, without reported diagnosis    Breast cancer 2001, 2010, 2013, 2018    S/P Rt mastectomy  BRAC neg   Ectopic pregnancy, tubal 1998   Genital HSV    Hemorrhoids    History of chemotherapy    History of radiation therapy 07/15/12-08/19/12   right/axilla/clavicular region 4500 cGy/25 sessions   History of toe surgery    Hx of radiation therapy 01/04/01 - 02/19/01   right breast   Irritable bowel syndrome    Type O blood, Rh negative     Home Medications Prior to Admission medications   Medication Sig Start Date End Date Taking? Authorizing Provider  Black Pepper-Turmeric 3-500 MG CAPS Take 1 tablet by mouth daily. 11/20/16   Serena Croissant, MD  calcium carbonate (OS-CAL) 600 MG TABS Take 1 tablet (600 mg total) by mouth 2 (two) times daily with a meal. 03/29/13   Lesly [facility], MD  cetirizine (ZYRTEC) 10 MG tablet Take by mouth.     [provider]  cholecalciferol (VITAMIN D) 1000 UNITS tablet Take 1,000 Units by mouth daily.      [provider]  denosumab (XGEVA) 120 MG/1.7ML SOLN injection Inject 120 mg into the skin once.    [provider]  fluconazole (DIFLUCAN) 150 MG tablet Take 1 PO now and may repeat in 3 days if needed. 03/26/21   Lesly [facility], MD  Fulvestrant (FASLODEX IM) Inject into the muscle.    [provider]  hydrocortisone-pramoxine [facility]) rectal foam Place 1 applicator rectally 2 (two) times daily. 03/27/21   Lesly [facility], MD  NON FORMULARY Beta Glucan 2 pills every morning. Patient not taking: Reported on 09/10/2020    [provider]  ondansetron (ZOFRAN) 4 MG tablet Take by mouth. 06/29/19   [provider]  Probiotic Product (PROBIOTIC DAILY PO) Take 1 tablet by mouth daily.    [provider]      Allergies    Zoledronic acid and Percocet [oxycodone-acetaminophen]    Review of Systems   Review of Systems  Physical Exam Updated Vital Signs BP (!) 151/86   Pulse (!) 102   Temp 100.2 F (37.9 C) (Oral)   Resp 16   Ht 5' 6.5" (1.689 m)  Wt 66.7 kg   SpO2 100%   BMI 23.37 kg/m  Physical Exam Vitals reviewed.  HENT:     Head: Atraumatic.  Eyes:     Pupils: Pupils are equal, round, and reactive to light.  Chest:     Chest wall: No tenderness.  Abdominal:     Tenderness: There is no abdominal tenderness.  Musculoskeletal:     Cervical back: Neck supple.  Neurological:     Mental Status: She is alert.     Comments: Face symmetric.  Sensation grossly intact laterally.  Eye movements intact.  Mildly decreased strength in right upper extremity and right grip.  Sensation grossly intact.  Good sensation and lower extremity with good straight leg raise on the right although left straight leg somewhat limited by pain.     ED Results / Procedures / Treatments   Labs (all labs ordered are listed, but only  abnormal results are displayed) Labs Reviewed  COMPREHENSIVE METABOLIC PANEL - Abnormal; Notable for the following components:      Result Value   Chloride 95 (*)    Glucose, Bld 102 (*)    AST 46 (*)    Alkaline Phosphatase 128 (*)    All other components within normal limits  CBC WITH DIFFERENTIAL/PLATELET - Abnormal; Notable for the following components:   RBC 3.46 (*)    Hemoglobin 10.3 (*)    HCT 31.7 (*)    All other components within normal limits  CBG MONITORING, ED    EKG None  Radiology CT Head Wo Contrast  Result Date: 03/21/2023 CLINICAL DATA:  Headache. Right-sided weakness. Metastatic breast cancer. EXAM: CT HEAD WITHOUT CONTRAST TECHNIQUE: Contiguous axial images were obtained from the base of the skull through the vertex without intravenous contrast. RADIATION DOSE REDUCTION: This exam was performed according to the departmental dose-optimization program which includes automated exposure control, adjustment of the mA and/or kV according to patient size and/or use of iterative reconstruction technique. COMPARISON:  None Available. FINDINGS: Brain: A hyperdense mass the posterior right frontal and parietal convexity measures 3.0 x 1.3 x 3.0 cm. This creates mass effect on the subjacent cortex. Subcortical white matter edema is present. No significant midline shift is present. No other focal lesions are present. White matter is otherwise normal. Deep brain nuclei are within normal limits. The ventricles are of normal size. The brainstem and cerebellum are within normal limits. Midline structures are within normal limits. Vascular: No hyperdense vessel or unexpected calcification. Skull: Calvarium is intact. No focal lytic or blastic lesions are present. No significant extracranial soft tissue lesion is present. Sinuses/Orbits: The paranasal sinuses and mastoid air cells are clear. The globes and orbits are within normal limits. IMPRESSION: 1. 3.0 x 1.3 x 3.0 cm hyperdense mass in  the posterior right frontal and parietal convexity with surrounding white matter edema. This is concerning for dural-based metastatic disease. Meningioma is considered less likely. Recommend MRI of the brain without and with contrast for further evaluation. 2. No significant midline shift. 3. No other acute or focal lesion to explain the patient's symptoms. Electronically Signed   By: Marin Roberts M.D.   On: 03/21/2023 15:19    Procedures Procedures    Medications Ordered in ED Medications - No data to display  ED Course/ Medical Decision Making/ A&P                             Medical Decision Making Amount and/or  Complexity of Data Reviewed Labs: ordered. Radiology: ordered.   Patient with focal neurodeficits on right upper extremity primarily although some right face involvement.  Does have metastatic breast cancer with no known brain mets.  Symptoms however began a week ago.  Not a TNK candidate due to time of onset.  Head CT done independently interpreted and discussed with Dr. Alfredo Batty from radiology.  Likely metastatic disease.  Likely MRI is here today.  Will get MRI to further evaluate.  Care turned over to Dr. Adela Lank.  I reviewed oncology notes from [facility].  CRITICAL CARE Performed by: Benjiman Core Total critical care time: 30 minutes Critical care time was exclusive of separately billable procedures and treating other patients. Critical care was necessary to treat or prevent imminent or life-threatening deterioration. Critical care was time spent personally by me on the following activities: development of treatment plan with patient and/or surrogate as well as nursing, discussions with consultants, evaluation of patient's response to treatment, examination of patient, obtaining history from patient or surrogate, ordering and performing treatments and interventions, ordering and review of laboratory studies, ordering and review of radiographic studies, pulse oximetry  and re-evaluation of patient's condition.          Final Clinical Impression(s) / ED Diagnoses Final diagnoses:  Brain mass    Rx / DC Orders ED Discharge Orders     None         Benjiman Core, MD 03/21/23 1534

## 2023-03-21 NOTE — ED Notes (Signed)
Pt ambulatory to bathroom, standby assistance provided by spouse.

## 2023-03-21 NOTE — ED Triage Notes (Addendum)
Pt c/o numbness to RUE, as well as some tingling to RT side of mouth; visitor noticed some aphasia; sxs began around 1230. Pt is receiving oral chemo

## 2023-03-21 NOTE — ED Notes (Signed)
Pt provided address to receiving hospital, instructed to go directly to facility.  Pts spouse at bedside stated he will be driving her. Pt wheeled to ED exit by NT. Pt alert, NAD, no further needs expressed

## 2023-03-21 NOTE — ED Notes (Signed)
Pt reports taking 600 mg motrin from personal supply.

## 2023-03-21 NOTE — ED Notes (Signed)
Attempted report x2, receiving busy signal from receiving facility.  Per NS via the transfer coordinator, receiving staff may be busy and to just keep trying to call number provided.

## 2023-03-21 NOTE — ED Notes (Signed)
Patient transported to MRI 

## 2023-03-21 NOTE — ED Notes (Signed)
Called [facility] transfer line for consult and transfer at 5:59
